# Patient Record
Sex: Female | Born: 1990 | Race: White | Hispanic: No | Marital: Married | Smoking: Former smoker
Health system: Southern US, Community
[De-identification: ages and names within clinical notes are randomized; demographics above are authoritative.]

## PROBLEM LIST (undated history)

## (undated) DIAGNOSIS — F429 Obsessive-compulsive disorder, unspecified: Secondary | ICD-10-CM

## (undated) DIAGNOSIS — O24111 Pre-existing diabetes mellitus, type 2, in pregnancy, first trimester: Secondary | ICD-10-CM

## (undated) DIAGNOSIS — N390 Urinary tract infection, site not specified: Secondary | ICD-10-CM

## (undated) DIAGNOSIS — O034 Incomplete spontaneous abortion without complication: Secondary | ICD-10-CM

## (undated) DIAGNOSIS — F419 Anxiety disorder, unspecified: Secondary | ICD-10-CM

## (undated) DIAGNOSIS — F329 Major depressive disorder, single episode, unspecified: Secondary | ICD-10-CM

## (undated) DIAGNOSIS — N719 Inflammatory disease of uterus, unspecified: Secondary | ICD-10-CM

## (undated) DIAGNOSIS — Z789 Other specified health status: Secondary | ICD-10-CM

## (undated) DIAGNOSIS — K219 Gastro-esophageal reflux disease without esophagitis: Secondary | ICD-10-CM

## (undated) DIAGNOSIS — F32A Depression, unspecified: Secondary | ICD-10-CM

## (undated) DIAGNOSIS — G43909 Migraine, unspecified, not intractable, without status migrainosus: Secondary | ICD-10-CM

## (undated) DIAGNOSIS — R519 Headache, unspecified: Secondary | ICD-10-CM

## (undated) DIAGNOSIS — R51 Headache: Secondary | ICD-10-CM

## (undated) DIAGNOSIS — B019 Varicella without complication: Secondary | ICD-10-CM

## (undated) HISTORY — DX: Headache, unspecified: R51.9

## (undated) HISTORY — DX: Pre-existing type 2 diabetes mellitus, in pregnancy, first trimester: O24.111

## (undated) HISTORY — PX: DILATION AND CURETTAGE OF UTERUS: SHX78

## (undated) HISTORY — DX: Migraine, unspecified, not intractable, without status migrainosus: G43.909

## (undated) HISTORY — PX: TONSILLECTOMY: SUR1361

## (undated) HISTORY — DX: Gastro-esophageal reflux disease without esophagitis: K21.9

## (undated) HISTORY — PX: HERNIA REPAIR: SHX51

## (undated) HISTORY — DX: Depression, unspecified: F32.A

## (undated) HISTORY — DX: Varicella without complication: B01.9

## (undated) HISTORY — DX: Headache: R51

## (undated) HISTORY — DX: Inflammatory disease of uterus, unspecified: N71.9

## (undated) HISTORY — DX: Urinary tract infection, site not specified: N39.0

## (undated) HISTORY — DX: Major depressive disorder, single episode, unspecified: F32.9

## (undated) HISTORY — PX: BLADDER SURGERY: SHX569

## (undated) HISTORY — DX: Obsessive-compulsive disorder, unspecified: F42.9

## (undated) HISTORY — DX: Incomplete spontaneous abortion without complication: O03.4

## (undated) HISTORY — DX: Anxiety disorder, unspecified: F41.9

---
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
## 2009-08-08 ENCOUNTER — Ambulatory Visit: Payer: Self-pay | Admitting: Family Medicine

## 2010-01-16 ENCOUNTER — Emergency Department: Payer: Self-pay | Admitting: Emergency Medicine

## 2010-03-07 ENCOUNTER — Emergency Department: Payer: Self-pay | Admitting: Emergency Medicine

## 2010-06-03 ENCOUNTER — Emergency Department: Payer: Self-pay | Admitting: Emergency Medicine

## 2010-07-23 ENCOUNTER — Emergency Department: Payer: Self-pay | Admitting: Emergency Medicine

## 2010-08-14 ENCOUNTER — Emergency Department: Payer: Self-pay | Admitting: Emergency Medicine

## 2010-09-22 NOTE — Op Note (Signed)
NAMECECILY, Sara Boyd          ACCOUNT NO.:  [id number]   MEDICAL RECORD NO.:  [id number]          PATIENT TYPE:  AMB   LOCATION:  [facility]                         FACILITY:  [facility]   PHYSICIAN:  Jamison Neighbor, M.D.  DATE OF BIRTH:  December 28, 1990   DATE OF PROCEDURE:  12/01/2007  DATE OF DISCHARGE:                               OPERATIVE REPORT   PREOPERATIVE DIAGNOSES:  1. Interstitial cystitis.  2. Urgency incontinence.   POSTOPERATIVE DIAGNOSES:  1. Interstitial cystitis.  2. Urgency incontinence.   PROCEDURE:  Cystoscopy, urethral calibration, hydrodistention of the  bladder, Marcaine and Pyridium installation, Marcaine and Kenalog  injection, bladder biopsy with cauterization.   SURGEON:  Jamison Neighbor, M.D.   ANESTHESIA:  General.   COMPLICATIONS:  None.   DRAINS:  None.   BRIEF HISTORY:  This 20 year old female presented to the office for  evaluation of chronic urinary tract symptoms including urgency,  frequency, pain and urge type incontinence.  The patient's examination  was felt to be potentially consistent with interstitial cystitis.  She  did undergo urodynamic testing which showed evidence of sensory urgency  with early sensation at just 44 mL and also evidence of true detrusor  instability with bladder pressures as high as 63 cm of water.  The  patient did not have much in the way of stress incontinence but  certainly is quite bothered by her incontinence as well as by her pelvic  pain.  The patient is now to undergo cystoscopy and hydrodistention to  evaluate her for interstitial cystitis.  She understands the risks and  benefits of the procedure and gave full informed consent.   PROCEDURE:  After successful induction of general anesthesia, the  patient was placed in the dorsal position, prepped with Betadine and  draped in the usual sterile fashion.  Careful bimanual examination  showed a normal urethra with no evidence of diverticulum.  There were no  signs of prolapse.  There was no cystocele, rectocele or enterocele.  The bladder was distended at a pressure of 100 cm of water for 5  minutes.  When the bladder was drained, granulations could be seen  throughout the bladder consistent with interstitial cystitis.  The  bladder capacity was just 500 mL which is markedly abnormal, comparing  quite unfavorably to a normal bladder capacity of between 1100 and 1200  mL actually less than the average IC capacity of 575.  A bladder biopsy  was performed and sent for mast cell analysis.  The bladder biopsy was  cauterized.  The bladder was drained.  A mixture of Marcaine and  Pyridium was left in the bladder.  A mixture of Marcaine and Kenalog was  injected periurethrally.  The patient tolerated the procedure well and  was taken to recovery in good condition.  She will be sent home with  Tylox, [place] and doxycycline and we also started her on anticholinergic  therapy.  She will return to see me in follow-up in 2  weeks at which point we will assess the results of the hydrodistention  and the anticholinergic therapy and decide whether the patient may  require  Botox, InterStim rather adjunctive treatments.  At that point we  will also initiate some IC directed therapy to try to improve her  bladder.      Jamison Neighbor, M.D.  Electronically Signed     RJE/MEDQ  D:  12/01/2007  T:  12/01/2007  Job:  [id number]

## 2010-10-22 ENCOUNTER — Emergency Department: Payer: Self-pay | Admitting: Unknown Physician Specialty

## 2010-12-21 ENCOUNTER — Emergency Department: Payer: Self-pay | Admitting: *Deleted

## 2011-02-05 LAB — POCT HEMOGLOBIN-HEMACUE
Hemoglobin: 13.5
Operator id: 268271

## 2011-03-21 ENCOUNTER — Emergency Department: Payer: Self-pay | Admitting: Emergency Medicine

## 2011-09-26 ENCOUNTER — Emergency Department: Payer: Self-pay | Admitting: *Deleted

## 2011-09-27 LAB — URINALYSIS, COMPLETE
Bacteria: NONE SEEN
Bilirubin,UR: NEGATIVE
Blood: NEGATIVE
Glucose,UR: NEGATIVE mg/dL (ref 0–75)
Ketone: NEGATIVE
Nitrite: NEGATIVE
Ph: 6 (ref 4.5–8.0)
Protein: NEGATIVE
Squamous Epithelial: 2

## 2011-09-27 LAB — CBC
HCT: 43.5 % (ref 35.0–47.0)
HGB: 14.1 g/dL (ref 12.0–16.0)
MCH: 28.5 pg (ref 26.0–34.0)
RBC: 4.96 10*6/uL (ref 3.80–5.20)

## 2011-09-27 LAB — TSH: Thyroid Stimulating Horm: 3.98 u[IU]/mL

## 2011-09-27 LAB — COMPREHENSIVE METABOLIC PANEL
Albumin: 3.8 g/dL (ref 3.4–5.0)
Alkaline Phosphatase: 64 U/L (ref 50–136)
BUN: 14 mg/dL (ref 7–18)
Bilirubin,Total: 0.4 mg/dL (ref 0.2–1.0)
Calcium, Total: 8.9 mg/dL (ref 8.5–10.1)
Chloride: 108 mmol/L — ABNORMAL HIGH (ref 98–107)
Creatinine: 0.95 mg/dL (ref 0.60–1.30)
EGFR (African American): >60
EGFR (Non-African Amer.): >60
Osmolality: 285 (ref 275–301)
SGOT(AST): 17 U/L (ref 15–37)
SGPT (ALT): 23 U/L
Total Protein: 7.7 g/dL (ref 6.4–8.2)

## 2011-09-27 LAB — T4, FREE: Free Thyroxine: 1.05 ng/dL (ref 0.76–1.46)

## 2011-09-27 LAB — PREGNANCY, URINE: Pregnancy Test, Urine: NEGATIVE m[IU]/mL

## 2011-09-29 ENCOUNTER — Emergency Department: Payer: Self-pay | Admitting: Emergency Medicine

## 2011-09-29 LAB — CBC WITH DIFFERENTIAL/PLATELET
Basophil #: 0.1 10*3/uL (ref 0.0–0.1)
Eosinophil #: 0 10*3/uL (ref 0.0–0.7)
Eosinophil %: 0.5 %
HCT: 42.1 % (ref 35.0–47.0)
HGB: 14.2 g/dL (ref 12.0–16.0)
MCV: 87 fL (ref 80–100)
Monocyte %: 4.1 %
Neutrophil %: 84.1 %
Platelet: 246 10*3/uL (ref 150–440)

## 2011-09-29 LAB — COMPREHENSIVE METABOLIC PANEL
Albumin: 3.8 g/dL (ref 3.4–5.0)
Alkaline Phosphatase: 51 U/L (ref 50–136)
Anion Gap: 7 (ref 7–16)
BUN: 14 mg/dL (ref 7–18)
Calcium, Total: 9.3 mg/dL (ref 8.5–10.1)
Co2: 25 mmol/L (ref 21–32)
Creatinine: 1.03 mg/dL (ref 0.60–1.30)
EGFR (African American): >60
Osmolality: 283 (ref 275–301)
Potassium: 3.9 mmol/L (ref 3.5–5.1)
Sodium: 142 mmol/L (ref 136–145)
Total Protein: 7.6 g/dL (ref 6.4–8.2)

## 2011-09-29 LAB — TSH: Thyroid Stimulating Horm: 0.898 u[IU]/mL

## 2011-10-11 ENCOUNTER — Emergency Department: Payer: Self-pay | Admitting: Emergency Medicine

## 2011-10-11 LAB — URINALYSIS, COMPLETE
Bilirubin,UR: NEGATIVE
Ketone: NEGATIVE
Nitrite: POSITIVE
Protein: 100
RBC,UR: 66 /HPF (ref 0–5)
Specific Gravity: 1.024 (ref 1.003–1.030)
Squamous Epithelial: 23

## 2011-10-11 LAB — CBC
HCT: 41.4 % (ref 35.0–47.0)
HGB: 13.5 g/dL (ref 12.0–16.0)
MCH: 28.8 pg (ref 26.0–34.0)
MCHC: 32.7 g/dL (ref 32.0–36.0)
MCV: 88 fL (ref 80–100)
Platelet: 207 10*3/uL (ref 150–440)
RBC: 4.71 10*6/uL (ref 3.80–5.20)
WBC: 8.6 10*3/uL (ref 3.6–11.0)

## 2011-10-11 LAB — COMPREHENSIVE METABOLIC PANEL
Albumin: 3.8 g/dL (ref 3.4–5.0)
Anion Gap: 11 (ref 7–16)
Bilirubin,Total: 0.5 mg/dL (ref 0.2–1.0)
Calcium, Total: 9 mg/dL (ref 8.5–10.1)
Co2: 23 mmol/L (ref 21–32)
Creatinine: 0.79 mg/dL (ref 0.60–1.30)
EGFR (African American): >60
Glucose: 108 mg/dL — ABNORMAL HIGH (ref 65–99)
Osmolality: 282 (ref 275–301)
Potassium: 3.7 mmol/L (ref 3.5–5.1)
Sodium: 142 mmol/L (ref 136–145)
Total Protein: 7 g/dL (ref 6.4–8.2)

## 2011-10-11 LAB — HCG, QUANTITATIVE, PREGNANCY: Beta Hcg, Quant.: 7650 m[IU]/mL — ABNORMAL HIGH

## 2011-10-11 LAB — WET PREP, GENITAL

## 2011-10-16 ENCOUNTER — Emergency Department: Payer: Self-pay | Admitting: Emergency Medicine

## 2011-10-16 LAB — CBC
HCT: 41.9 % (ref 35.0–47.0)
HGB: 14.1 g/dL (ref 12.0–16.0)
MCH: 29.7 pg (ref 26.0–34.0)
Platelet: 199 10*3/uL (ref 150–440)

## 2011-10-16 LAB — COMPREHENSIVE METABOLIC PANEL
Albumin: 3.8 g/dL (ref 3.4–5.0)
Anion Gap: 8 (ref 7–16)
BUN: 9 mg/dL (ref 7–18)
Bilirubin,Total: 0.6 mg/dL (ref 0.2–1.0)
Chloride: 107 mmol/L (ref 98–107)
Co2: 24 mmol/L (ref 21–32)
EGFR (Non-African Amer.): >60
Osmolality: 276 (ref 275–301)
Potassium: 4.2 mmol/L (ref 3.5–5.1)
SGOT(AST): 17 U/L (ref 15–37)
SGPT (ALT): 24 U/L
Sodium: 139 mmol/L (ref 136–145)

## 2011-10-16 LAB — URINALYSIS, COMPLETE
Bilirubin,UR: NEGATIVE
Blood: NEGATIVE
Glucose,UR: NEGATIVE mg/dL (ref 0–75)
RBC,UR: 1 /HPF (ref 0–5)
Squamous Epithelial: 2
WBC UR: 20 /HPF (ref 0–5)

## 2011-10-16 LAB — LIPASE, BLOOD: Lipase: 99 U/L (ref 73–393)

## 2011-10-16 LAB — WET PREP, GENITAL

## 2011-10-17 LAB — URINE CULTURE

## 2011-10-20 LAB — URINE CULTURE

## 2011-10-23 ENCOUNTER — Emergency Department: Payer: Self-pay | Admitting: Emergency Medicine

## 2011-10-23 LAB — CBC
HCT: 41.6 % (ref 35.0–47.0)
MCH: 29.9 pg (ref 26.0–34.0)
MCHC: 34 g/dL (ref 32.0–36.0)
MCV: 88 fL (ref 80–100)
Platelet: 158 10*3/uL (ref 150–440)
RDW: 14.1 % (ref 11.5–14.5)
WBC: 9.5 10*3/uL (ref 3.6–11.0)

## 2011-10-23 LAB — URINALYSIS, COMPLETE
Bacteria: NONE SEEN
Bilirubin,UR: NEGATIVE
Blood: NEGATIVE
Glucose,UR: NEGATIVE mg/dL (ref 0–75)
Ketone: NEGATIVE
Leukocyte Esterase: NEGATIVE
Nitrite: NEGATIVE
Ph: 6 (ref 4.5–8.0)
Protein: NEGATIVE
RBC,UR: NONE SEEN /HPF (ref 0–5)
Specific Gravity: 1.02 (ref 1.003–1.030)
Squamous Epithelial: 2
WBC UR: NONE SEEN /HPF (ref 0–5)

## 2011-10-23 LAB — WET PREP, GENITAL

## 2011-10-23 LAB — HCG, QUANTITATIVE, PREGNANCY: Beta Hcg, Quant.: 47127 m[IU]/mL — ABNORMAL HIGH

## 2011-11-02 ENCOUNTER — Emergency Department: Payer: Self-pay | Admitting: Emergency Medicine

## 2011-11-02 LAB — URINALYSIS, COMPLETE
Bacteria: NONE SEEN
Bilirubin,UR: NEGATIVE
Ph: 5 (ref 4.5–8.0)
Protein: 30
RBC,UR: 2 /HPF (ref 0–5)
Specific Gravity: 1.023 (ref 1.003–1.030)
Squamous Epithelial: 16
WBC UR: 63 /HPF (ref 0–5)

## 2011-11-02 LAB — HCG, QUANTITATIVE, PREGNANCY: Beta Hcg, Quant.: 74824 m[IU]/mL — ABNORMAL HIGH

## 2011-11-02 LAB — COMPREHENSIVE METABOLIC PANEL
Albumin: 3.7 g/dL (ref 3.4–5.0)
Alkaline Phosphatase: 63 U/L (ref 50–136)
Calcium, Total: 9.3 mg/dL (ref 8.5–10.1)
EGFR (African American): >60
EGFR (Non-African Amer.): >60
Potassium: 3.5 mmol/L (ref 3.5–5.1)
SGPT (ALT): 22 U/L
Sodium: 137 mmol/L (ref 136–145)

## 2011-11-02 LAB — CBC
HCT: 41.6 % (ref 35.0–47.0)
HGB: 14 g/dL (ref 12.0–16.0)
MCV: 88 fL (ref 80–100)
Platelet: 253 10*3/uL (ref 150–440)
RDW: 13.9 % (ref 11.5–14.5)
WBC: 9.8 10*3/uL (ref 3.6–11.0)

## 2011-11-06 ENCOUNTER — Inpatient Hospital Stay: Payer: Self-pay

## 2011-11-06 LAB — URINALYSIS, COMPLETE
Bilirubin,UR: NEGATIVE
Blood: NEGATIVE
Glucose,UR: NEGATIVE mg/dL (ref 0–75)
Nitrite: NEGATIVE
Ph: 5 (ref 4.5–8.0)
Protein: 30
Specific Gravity: 1.026 (ref 1.003–1.030)

## 2011-11-06 LAB — BASIC METABOLIC PANEL
Anion Gap: 6 — ABNORMAL LOW (ref 7–16)
BUN: 6 mg/dL — ABNORMAL LOW (ref 7–18)
Calcium, Total: 9 mg/dL (ref 8.5–10.1)
Chloride: 110 mmol/L — ABNORMAL HIGH (ref 98–107)
Creatinine: 0.81 mg/dL (ref 0.60–1.30)
EGFR (Non-African Amer.): >60
Glucose: 106 mg/dL — ABNORMAL HIGH (ref 65–99)
Osmolality: 279 (ref 275–301)
Potassium: 3.4 mmol/L — ABNORMAL LOW (ref 3.5–5.1)
Sodium: 141 mmol/L (ref 136–145)

## 2011-11-25 ENCOUNTER — Emergency Department: Payer: Self-pay | Admitting: Emergency Medicine

## 2011-11-25 LAB — URINALYSIS, COMPLETE
Bilirubin,UR: NEGATIVE
Glucose,UR: NEGATIVE mg/dL (ref 0–75)
Ketone: NEGATIVE
Ph: 5 (ref 4.5–8.0)
Protein: NEGATIVE
RBC,UR: 1 /HPF (ref 0–5)

## 2011-11-25 LAB — HCG, QUANTITATIVE, PREGNANCY: Beta Hcg, Quant.: <1 m[IU]/mL — ABNORMAL LOW

## 2011-11-25 LAB — CBC
HGB: 13.9 g/dL (ref 12.0–16.0)
MCH: 30 pg (ref 26.0–34.0)
MCV: 88 fL (ref 80–100)
RBC: 4.62 10*6/uL (ref 3.80–5.20)
WBC: 6.9 10*3/uL (ref 3.6–11.0)

## 2011-12-05 ENCOUNTER — Emergency Department: Payer: Self-pay | Admitting: Unknown Physician Specialty

## 2011-12-05 LAB — CBC
HCT: 40.4 % (ref 35.0–47.0)
HGB: 14.2 g/dL (ref 12.0–16.0)
MCH: 30.5 pg (ref 26.0–34.0)
Platelet: 236 10*3/uL (ref 150–440)
RBC: 4.65 10*6/uL (ref 3.80–5.20)
RDW: 13.8 % (ref 11.5–14.5)

## 2011-12-05 LAB — COMPREHENSIVE METABOLIC PANEL
Albumin: 3.2 g/dL — ABNORMAL LOW (ref 3.4–5.0)
Bilirubin,Total: 0.5 mg/dL (ref 0.2–1.0)
Chloride: 110 mmol/L — ABNORMAL HIGH (ref 98–107)
Creatinine: 0.76 mg/dL (ref 0.60–1.30)
EGFR (African American): >60
Potassium: 3.8 mmol/L (ref 3.5–5.1)
SGOT(AST): 17 U/L (ref 15–37)
SGPT (ALT): 19 U/L
Total Protein: 7 g/dL (ref 6.4–8.2)

## 2011-12-05 LAB — URINALYSIS, COMPLETE
Bilirubin,UR: NEGATIVE
Blood: NEGATIVE
Nitrite: NEGATIVE
Ph: 5 (ref 4.5–8.0)
RBC,UR: <1 /HPF (ref 0–5)
Specific Gravity: 1.023 (ref 1.003–1.030)
Squamous Epithelial: 3

## 2011-12-05 LAB — PREGNANCY, URINE: Pregnancy Test, Urine: POSITIVE m[IU]/mL

## 2011-12-05 LAB — HCG, QUANTITATIVE, PREGNANCY: Beta Hcg, Quant.: 30527 m[IU]/mL — ABNORMAL HIGH

## 2011-12-10 ENCOUNTER — Emergency Department: Payer: Self-pay | Admitting: *Deleted

## 2011-12-10 LAB — BASIC METABOLIC PANEL
Calcium, Total: 8.7 mg/dL (ref 8.5–10.1)
Chloride: 110 mmol/L — ABNORMAL HIGH (ref 98–107)
Co2: 22 mmol/L (ref 21–32)
Creatinine: 0.69 mg/dL (ref 0.60–1.30)
EGFR (African American): >60
Glucose: 103 mg/dL — ABNORMAL HIGH (ref 65–99)
Osmolality: 280 (ref 275–301)
Potassium: 3.7 mmol/L (ref 3.5–5.1)
Sodium: 141 mmol/L (ref 136–145)

## 2011-12-10 LAB — URINALYSIS, COMPLETE
Bacteria: NONE SEEN
Bilirubin,UR: NEGATIVE
Glucose,UR: NEGATIVE mg/dL (ref 0–75)
Ketone: NEGATIVE
Leukocyte Esterase: NEGATIVE
Protein: NEGATIVE
RBC,UR: NONE SEEN /HPF (ref 0–5)
Specific Gravity: 1.024 (ref 1.003–1.030)
WBC UR: 1 /HPF (ref 0–5)

## 2011-12-10 LAB — CBC
MCH: 30.7 pg (ref 26.0–34.0)
MCHC: 35.6 g/dL (ref 32.0–36.0)
MCV: 86 fL (ref 80–100)
Platelet: 203 10*3/uL (ref 150–440)
RDW: 13.5 % (ref 11.5–14.5)
WBC: 8.3 10*3/uL (ref 3.6–11.0)

## 2011-12-23 ENCOUNTER — Observation Stay: Payer: Self-pay | Admitting: Obstetrics & Gynecology

## 2011-12-23 LAB — URINALYSIS, COMPLETE
Bacteria: NONE SEEN
Bilirubin,UR: NEGATIVE
Glucose,UR: >=500 mg/dL (ref 0–75)
Leukocyte Esterase: NEGATIVE
Nitrite: NEGATIVE
Protein: NEGATIVE
RBC,UR: NONE SEEN /HPF (ref 0–5)
Specific Gravity: 1.002 (ref 1.003–1.030)
Squamous Epithelial: 1

## 2011-12-23 LAB — BASIC METABOLIC PANEL
Anion Gap: 7 (ref 7–16)
BUN: 5 mg/dL — ABNORMAL LOW (ref 7–18)
Chloride: 108 mmol/L — ABNORMAL HIGH (ref 98–107)
Co2: 24 mmol/L (ref 21–32)
EGFR (African American): >60
Osmolality: 272 (ref 275–301)

## 2012-01-02 ENCOUNTER — Inpatient Hospital Stay: Payer: Self-pay | Admitting: Obstetrics & Gynecology

## 2012-01-02 LAB — URINALYSIS, COMPLETE
Bacteria: NONE SEEN
Bilirubin,UR: NEGATIVE
Blood: NEGATIVE
Ketone: NEGATIVE
Ph: 5 (ref 4.5–8.0)
Protein: NEGATIVE
RBC,UR: 8 /HPF (ref 0–5)
Specific Gravity: 1.028 (ref 1.003–1.030)
Squamous Epithelial: 2

## 2012-01-02 LAB — COMPREHENSIVE METABOLIC PANEL
Albumin: 3.1 g/dL — ABNORMAL LOW (ref 3.4–5.0)
Alkaline Phosphatase: 63 U/L (ref 50–136)
Anion Gap: 11 (ref 7–16)
BUN: 11 mg/dL (ref 7–18)
EGFR (African American): >60
Glucose: 121 mg/dL — ABNORMAL HIGH (ref 65–99)
Potassium: 3.8 mmol/L (ref 3.5–5.1)
SGOT(AST): 23 U/L (ref 15–37)
SGPT (ALT): 28 U/L (ref 12–78)
Sodium: 139 mmol/L (ref 136–145)
Total Protein: 7.2 g/dL (ref 6.4–8.2)

## 2012-01-02 LAB — CBC
HCT: 37.7 % (ref 35.0–47.0)
HGB: 13.3 g/dL (ref 12.0–16.0)
MCH: 30.4 pg (ref 26.0–34.0)
MCV: 86 fL (ref 80–100)
Platelet: 261 10*3/uL (ref 150–440)
RDW: 13.5 % (ref 11.5–14.5)
WBC: 8.3 10*3/uL (ref 3.6–11.0)

## 2012-01-02 LAB — WET PREP, GENITAL

## 2012-01-03 LAB — CBC WITH DIFFERENTIAL/PLATELET
Basophil #: 0 10*3/uL (ref 0.0–0.1)
Basophil %: 0.4 %
Eosinophil #: 0.1 10*3/uL (ref 0.0–0.7)
HCT: 34.9 % — ABNORMAL LOW (ref 35.0–47.0)
HGB: 12.1 g/dL (ref 12.0–16.0)
Lymphocyte %: 26.1 %
MCHC: 34.7 g/dL (ref 32.0–36.0)
MCV: 87 fL (ref 80–100)
Monocyte %: 5.6 %
Neutrophil #: 3.9 10*3/uL (ref 1.4–6.5)
Neutrophil %: 66.5 %
Platelet: 193 10*3/uL (ref 150–440)
WBC: 5.9 10*3/uL (ref 3.6–11.0)

## 2012-01-04 LAB — FETAL FIBRONECTIN

## 2012-01-06 ENCOUNTER — Encounter: Payer: Self-pay | Admitting: Obstetrics & Gynecology

## 2012-01-06 LAB — URINALYSIS, COMPLETE
Bilirubin,UR: NEGATIVE
Blood: NEGATIVE
Ketone: NEGATIVE
Nitrite: NEGATIVE
Ph: 6 (ref 4.5–8.0)
Protein: NEGATIVE
Specific Gravity: 1.024 (ref 1.003–1.030)
Squamous Epithelial: 15
WBC UR: 3 /HPF (ref 0–5)

## 2012-01-11 ENCOUNTER — Emergency Department: Payer: Self-pay | Admitting: Emergency Medicine

## 2012-01-11 LAB — COMPREHENSIVE METABOLIC PANEL
Albumin: 3.1 g/dL — ABNORMAL LOW (ref 3.4–5.0)
Alkaline Phosphatase: 58 U/L (ref 50–136)
Bilirubin,Total: 0.5 mg/dL (ref 0.2–1.0)
Calcium, Total: 9.1 mg/dL (ref 8.5–10.1)
EGFR (African American): >60
Glucose: 108 mg/dL — ABNORMAL HIGH (ref 65–99)
SGOT(AST): 14 U/L — ABNORMAL LOW (ref 15–37)

## 2012-01-11 LAB — CBC
HCT: 36.9 % (ref 35.0–47.0)
MCHC: 34.7 g/dL (ref 32.0–36.0)
Platelet: 249 10*3/uL (ref 150–440)
RDW: 13.9 % (ref 11.5–14.5)
WBC: 9.8 10*3/uL (ref 3.6–11.0)

## 2012-01-19 ENCOUNTER — Emergency Department: Payer: Self-pay | Admitting: Unknown Physician Specialty

## 2012-01-19 LAB — URINALYSIS, COMPLETE
Bilirubin,UR: NEGATIVE
Blood: NEGATIVE
Glucose,UR: NEGATIVE mg/dL (ref 0–75)
Ph: 5 (ref 4.5–8.0)
Protein: NEGATIVE
RBC,UR: 1 /HPF (ref 0–5)
Specific Gravity: 1.028 (ref 1.003–1.030)

## 2012-01-19 LAB — COMPREHENSIVE METABOLIC PANEL
Albumin: 2.9 g/dL — ABNORMAL LOW (ref 3.4–5.0)
Anion Gap: 9 (ref 7–16)
BUN: 10 mg/dL (ref 7–18)
Calcium, Total: 9.1 mg/dL (ref 8.5–10.1)
Chloride: 109 mmol/L — ABNORMAL HIGH (ref 98–107)
Creatinine: 0.6 mg/dL (ref 0.60–1.30)
EGFR (African American): >60
EGFR (Non-African Amer.): >60
Osmolality: 275 (ref 275–301)
Potassium: 3.9 mmol/L (ref 3.5–5.1)
SGPT (ALT): 18 U/L (ref 12–78)
Sodium: 139 mmol/L (ref 136–145)
Total Protein: 6.8 g/dL (ref 6.4–8.2)

## 2012-01-19 LAB — CBC
HCT: 36.2 % (ref 35.0–47.0)
HGB: 12.8 g/dL (ref 12.0–16.0)
MCV: 87 fL (ref 80–100)
RBC: 4.16 10*6/uL (ref 3.80–5.20)

## 2012-01-19 LAB — LIPASE, BLOOD: Lipase: 79 U/L (ref 73–393)

## 2012-02-05 ENCOUNTER — Observation Stay: Payer: Self-pay

## 2012-02-05 LAB — URINALYSIS, COMPLETE
Blood: NEGATIVE
Glucose,UR: NEGATIVE mg/dL (ref 0–75)
Hyaline Cast: 3
Nitrite: NEGATIVE
Protein: 30
Specific Gravity: 1.027 (ref 1.003–1.030)
WBC UR: 13 /HPF (ref 0–5)

## 2012-02-13 ENCOUNTER — Observation Stay: Payer: Self-pay | Admitting: Obstetrics and Gynecology

## 2012-02-13 LAB — URINALYSIS, COMPLETE
Blood: NEGATIVE
Glucose,UR: 100 mg/dL (ref 0–75)
Nitrite: NEGATIVE
Ph: 6 (ref 4.5–8.0)
Specific Gravity: 1.02 (ref 1.003–1.030)
Squamous Epithelial: 4

## 2012-02-13 LAB — FETAL FIBRONECTIN
Appearance: NORMAL
Fetal Fibronectin: POSITIVE

## 2012-02-15 ENCOUNTER — Observation Stay: Payer: Self-pay

## 2012-02-15 LAB — CBC WITH DIFFERENTIAL/PLATELET
Basophil #: 0 10*3/uL (ref 0.0–0.1)
Eosinophil #: 0 10*3/uL (ref 0.0–0.7)
HCT: 33.9 % — ABNORMAL LOW (ref 35.0–47.0)
MCV: 88 fL (ref 80–100)
Monocyte #: 0.5 x10 3/mm (ref 0.2–0.9)
Neutrophil %: 90.4 %
Platelet: 215 10*3/uL (ref 150–440)
RBC: 3.88 10*6/uL (ref 3.80–5.20)
RDW: 13.5 % (ref 11.5–14.5)
WBC: 13.4 10*3/uL — ABNORMAL HIGH (ref 3.6–11.0)

## 2012-02-15 LAB — BASIC METABOLIC PANEL
Anion Gap: 11 (ref 7–16)
BUN: 6 mg/dL — ABNORMAL LOW (ref 7–18)
Calcium, Total: 8.5 mg/dL (ref 8.5–10.1)
Chloride: 110 mmol/L — ABNORMAL HIGH (ref 98–107)
Co2: 21 mmol/L (ref 21–32)
Creatinine: 0.73 mg/dL (ref 0.60–1.30)
EGFR (African American): >60
Osmolality: 282 (ref 275–301)

## 2012-02-16 LAB — URINE CULTURE

## 2012-03-03 ENCOUNTER — Observation Stay: Payer: Self-pay | Admitting: Obstetrics & Gynecology

## 2012-04-01 ENCOUNTER — Observation Stay: Payer: Self-pay | Admitting: Internal Medicine

## 2012-04-09 ENCOUNTER — Observation Stay: Payer: Self-pay

## 2012-04-09 LAB — URINALYSIS, COMPLETE
Bilirubin,UR: NEGATIVE
Glucose,UR: NEGATIVE mg/dL (ref 0–75)
Ketone: NEGATIVE
Protein: 100
Specific Gravity: 1.018 (ref 1.003–1.030)
Squamous Epithelial: 2

## 2012-04-13 ENCOUNTER — Observation Stay: Payer: Self-pay | Admitting: Obstetrics and Gynecology

## 2012-04-13 LAB — FETAL FIBRONECTIN

## 2012-04-13 LAB — URINALYSIS, COMPLETE
Bilirubin,UR: NEGATIVE
Glucose,UR: 50 mg/dL (ref 0–75)
Ketone: NEGATIVE
Nitrite: NEGATIVE
Protein: NEGATIVE
RBC,UR: 2 /HPF (ref 0–5)
Specific Gravity: 1.023 (ref 1.003–1.030)
Squamous Epithelial: 4
WBC UR: 49 /HPF (ref 0–5)

## 2012-05-06 ENCOUNTER — Observation Stay: Payer: Self-pay

## 2012-05-17 ENCOUNTER — Observation Stay: Payer: Self-pay

## 2012-05-28 ENCOUNTER — Observation Stay: Payer: Self-pay

## 2012-05-31 ENCOUNTER — Inpatient Hospital Stay: Payer: Self-pay | Admitting: Obstetrics & Gynecology

## 2012-05-31 LAB — CBC WITH DIFFERENTIAL/PLATELET
Basophil %: 0.5 %
Eosinophil %: 0.5 %
HCT: 36.6 % (ref 35.0–47.0)
HGB: 12.3 g/dL (ref 12.0–16.0)
Lymphocyte #: 1.4 10*3/uL (ref 1.0–3.6)
Lymphocyte %: 13.6 %
MCH: 28.2 pg (ref 26.0–34.0)
MCV: 84 fL (ref 80–100)
Neutrophil #: 8.2 10*3/uL — ABNORMAL HIGH (ref 1.4–6.5)
Neutrophil %: 80 %
Platelet: 224 10*3/uL (ref 150–440)
RBC: 4.38 10*6/uL (ref 3.80–5.20)
RDW: 14.3 % (ref 11.5–14.5)

## 2012-06-02 LAB — HEMATOCRIT: HCT: 31.7 % — ABNORMAL LOW (ref 35.0–47.0)

## 2012-06-08 ENCOUNTER — Emergency Department: Payer: Self-pay | Admitting: Emergency Medicine

## 2012-06-08 LAB — COMPREHENSIVE METABOLIC PANEL
Albumin: 3.3 g/dL — ABNORMAL LOW (ref 3.4–5.0)
Anion Gap: 9 (ref 7–16)
Bilirubin,Total: 0.6 mg/dL (ref 0.2–1.0)
Chloride: 108 mmol/L — ABNORMAL HIGH (ref 98–107)
Co2: 23 mmol/L (ref 21–32)
Creatinine: 1.05 mg/dL (ref 0.60–1.30)
EGFR (African American): >60
SGOT(AST): 19 U/L (ref 15–37)
Sodium: 140 mmol/L (ref 136–145)
Total Protein: 7.8 g/dL (ref 6.4–8.2)

## 2012-06-08 LAB — URINALYSIS, COMPLETE
Bilirubin,UR: NEGATIVE
Nitrite: NEGATIVE
Specific Gravity: 1.019 (ref 1.003–1.030)
Squamous Epithelial: 4
WBC UR: 273 /HPF (ref 0–5)

## 2012-06-08 LAB — CBC
MCH: 28.5 pg (ref 26.0–34.0)
MCHC: 33.9 g/dL (ref 32.0–36.0)
MCV: 84 fL (ref 80–100)
Platelet: 318 10*3/uL (ref 150–440)
RBC: 5.02 10*6/uL (ref 3.80–5.20)
RDW: 14.5 % (ref 11.5–14.5)
WBC: 10.5 10*3/uL (ref 3.6–11.0)

## 2012-06-08 LAB — LIPASE, BLOOD: Lipase: 109 U/L (ref 73–393)

## 2012-06-08 LAB — HCG, QUANTITATIVE, PREGNANCY: Beta Hcg, Quant.: 11 m[IU]/mL — ABNORMAL HIGH

## 2013-12-08 ENCOUNTER — Emergency Department: Payer: Self-pay | Admitting: Emergency Medicine

## 2013-12-12 ENCOUNTER — Emergency Department: Payer: Self-pay | Admitting: Emergency Medicine

## 2013-12-12 LAB — CARBAMAZEPINE LEVEL, TOTAL: Carbamazepine: 1 ug/mL — ABNORMAL LOW (ref 4.0–12.0)

## 2013-12-14 ENCOUNTER — Emergency Department: Payer: Self-pay | Admitting: Emergency Medicine

## 2013-12-14 LAB — COMPREHENSIVE METABOLIC PANEL
ALK PHOS: 64 U/L
ANION GAP: 11 (ref 7–16)
AST: 20 U/L (ref 15–37)
Albumin: 4 g/dL (ref 3.4–5.0)
BILIRUBIN TOTAL: 0.5 mg/dL (ref 0.2–1.0)
BUN: 11 mg/dL (ref 7–18)
CALCIUM: 9 mg/dL (ref 8.5–10.1)
CHLORIDE: 108 mmol/L — AB (ref 98–107)
Co2: 23 mmol/L (ref 21–32)
Creatinine: 1.01 mg/dL (ref 0.60–1.30)
EGFR (Non-African Amer.): >60
GLUCOSE: 100 mg/dL — AB (ref 65–99)
OSMOLALITY: 283 (ref 275–301)
Potassium: 3.7 mmol/L (ref 3.5–5.1)
SGPT (ALT): 24 U/L
SODIUM: 142 mmol/L (ref 136–145)
TOTAL PROTEIN: 7.8 g/dL (ref 6.4–8.2)

## 2013-12-14 LAB — CBC WITH DIFFERENTIAL/PLATELET
BASOS ABS: 0 10*3/uL (ref 0.0–0.1)
Basophil %: 0.4 %
Eosinophil #: 0 10*3/uL (ref 0.0–0.7)
Eosinophil %: 0.3 %
HCT: 42.6 % (ref 35.0–47.0)
HGB: 14.4 g/dL (ref 12.0–16.0)
Lymphocyte #: 1.2 10*3/uL (ref 1.0–3.6)
Lymphocyte %: 13.9 %
MCH: 29.1 pg (ref 26.0–34.0)
MCHC: 33.7 g/dL (ref 32.0–36.0)
MCV: 86 fL (ref 80–100)
MONOS PCT: 4.8 %
Monocyte #: 0.4 x10 3/mm (ref 0.2–0.9)
NEUTROS ABS: 7.1 10*3/uL — AB (ref 1.4–6.5)
Neutrophil %: 80.6 %
PLATELETS: 242 10*3/uL (ref 150–440)
RBC: 4.93 10*6/uL (ref 3.80–5.20)
RDW: 13.8 % (ref 11.5–14.5)
WBC: 8.8 10*3/uL (ref 3.6–11.0)

## 2013-12-14 LAB — CK TOTAL AND CKMB (NOT AT ARMC)
CK, Total: 85 U/L
CK-MB: 0.8 ng/mL (ref 0.5–3.6)

## 2013-12-14 LAB — URINALYSIS, COMPLETE
BILIRUBIN, UR: NEGATIVE
Blood: NEGATIVE
Glucose,UR: NEGATIVE mg/dL (ref 0–75)
Nitrite: NEGATIVE
PH: 5 (ref 4.5–8.0)
Protein: NEGATIVE
RBC,UR: 2 /HPF (ref 0–5)
Specific Gravity: 1.02 (ref 1.003–1.030)
Squamous Epithelial: 3

## 2013-12-14 LAB — TROPONIN I: Troponin-I: <0.02 ng/mL

## 2013-12-24 ENCOUNTER — Emergency Department: Payer: Self-pay | Admitting: Emergency Medicine

## 2013-12-25 ENCOUNTER — Emergency Department: Payer: Self-pay | Admitting: Emergency Medicine

## 2013-12-27 ENCOUNTER — Emergency Department: Payer: Self-pay | Admitting: Emergency Medicine

## 2013-12-27 LAB — CK: CK, Total: 59 U/L

## 2013-12-31 ENCOUNTER — Telehealth: Payer: Self-pay | Admitting: Internal Medicine

## 2013-12-31 NOTE — Telephone Encounter (Signed)
OK to schedule pt in next open slot. We can look at schedule today if necessary.

## 2013-12-31 NOTE — Telephone Encounter (Signed)
Ok to schedule patient

## 2013-12-31 NOTE — Telephone Encounter (Signed)
Pt's mother called in and stated she is a patient here and said Dr.Walker would accept her as a new pt and stated she needs to be seen asap so she can be referred to a heart doctor. Was told not anything until October but would like to be seen a lot sooner. Please advise.

## 2013-12-31 NOTE — Telephone Encounter (Signed)
I blocked a 30 min slot 01/11/14 at 3:00, please call and schedule then if possible

## 2014-01-06 ENCOUNTER — Emergency Department: Payer: Self-pay | Admitting: Emergency Medicine

## 2014-01-06 LAB — URINALYSIS, COMPLETE
Bilirubin,UR: NEGATIVE
Blood: NEGATIVE
GLUCOSE, UR: NEGATIVE mg/dL (ref 0–75)
KETONE: NEGATIVE
Nitrite: POSITIVE
Ph: 5 (ref 4.5–8.0)
Protein: 25
Specific Gravity: 1.025 (ref 1.003–1.030)
Squamous Epithelial: 20
WBC UR: 21 /HPF (ref 0–5)

## 2014-01-06 LAB — COMPREHENSIVE METABOLIC PANEL
Albumin: 4.3 g/dL (ref 3.4–5.0)
Alkaline Phosphatase: 66 U/L
Anion Gap: 12 (ref 7–16)
BUN: 9 mg/dL (ref 7–18)
Bilirubin,Total: 0.8 mg/dL (ref 0.2–1.0)
Calcium, Total: 9.5 mg/dL (ref 8.5–10.1)
Chloride: 110 mmol/L — ABNORMAL HIGH (ref 98–107)
Co2: 19 mmol/L — ABNORMAL LOW (ref 21–32)
Creatinine: 0.96 mg/dL (ref 0.60–1.30)
EGFR (Non-African Amer.): >60
Glucose: 93 mg/dL (ref 65–99)
OSMOLALITY: 280 (ref 275–301)
Potassium: 3.8 mmol/L (ref 3.5–5.1)
SGOT(AST): 12 U/L — ABNORMAL LOW (ref 15–37)
SGPT (ALT): 26 U/L
Sodium: 141 mmol/L (ref 136–145)
Total Protein: 7.6 g/dL (ref 6.4–8.2)

## 2014-01-06 LAB — CBC WITH DIFFERENTIAL/PLATELET
Basophil #: 0 10*3/uL (ref 0.0–0.1)
Basophil %: 0.6 %
Eosinophil #: 0 10*3/uL (ref 0.0–0.7)
Eosinophil %: 0.5 %
HCT: 44.5 % (ref 35.0–47.0)
HGB: 14.6 g/dL (ref 12.0–16.0)
LYMPHS PCT: 23.2 %
Lymphocyte #: 1.6 10*3/uL (ref 1.0–3.6)
MCH: 28.9 pg (ref 26.0–34.0)
MCHC: 32.9 g/dL (ref 32.0–36.0)
MCV: 88 fL (ref 80–100)
MONO ABS: 0.4 x10 3/mm (ref 0.2–0.9)
MONOS PCT: 6 %
Neutrophil #: 4.9 10*3/uL (ref 1.4–6.5)
Neutrophil %: 69.7 %
PLATELETS: 227 10*3/uL (ref 150–440)
RBC: 5.07 10*6/uL (ref 3.80–5.20)
RDW: 13.6 % (ref 11.5–14.5)
WBC: 7 10*3/uL (ref 3.6–11.0)

## 2014-01-06 LAB — LIPASE, BLOOD: LIPASE: 105 U/L (ref 73–393)

## 2014-01-06 LAB — TROPONIN I

## 2014-01-09 ENCOUNTER — Emergency Department: Payer: Self-pay | Admitting: Student

## 2014-01-09 LAB — DRUG SCREEN, URINE

## 2014-01-09 LAB — URINALYSIS, COMPLETE
BLOOD: NEGATIVE
Bilirubin,UR: NEGATIVE
GLUCOSE, UR: NEGATIVE mg/dL (ref 0–75)
Ketone: NEGATIVE
Nitrite: NEGATIVE
PH: 5 (ref 4.5–8.0)
PROTEIN: NEGATIVE
RBC,UR: 4 /HPF (ref 0–5)
SPECIFIC GRAVITY: 1.027 (ref 1.003–1.030)
Squamous Epithelial: 12
WBC UR: 17 /HPF (ref 0–5)

## 2014-01-09 LAB — CBC
HCT: 45.2 % (ref 35.0–47.0)
HGB: 14.9 g/dL (ref 12.0–16.0)
MCH: 29.2 pg (ref 26.0–34.0)
MCHC: 32.9 g/dL (ref 32.0–36.0)
MCV: 89 fL (ref 80–100)
PLATELETS: 209 10*3/uL (ref 150–440)
RBC: 5.1 10*6/uL (ref 3.80–5.20)
RDW: 13.9 % (ref 11.5–14.5)
WBC: 6 10*3/uL (ref 3.6–11.0)

## 2014-01-09 LAB — COMPREHENSIVE METABOLIC PANEL
ALBUMIN: 4.1 g/dL (ref 3.4–5.0)
ALK PHOS: 65 U/L
Anion Gap: 5 — ABNORMAL LOW (ref 7–16)
BUN: 11 mg/dL (ref 7–18)
Bilirubin,Total: 0.7 mg/dL (ref 0.2–1.0)
CALCIUM: 9.2 mg/dL (ref 8.5–10.1)
CREATININE: 1.03 mg/dL (ref 0.60–1.30)
Chloride: 111 mmol/L — ABNORMAL HIGH (ref 98–107)
Co2: 22 mmol/L (ref 21–32)
EGFR (African American): >60
Glucose: 104 mg/dL — ABNORMAL HIGH (ref 65–99)
Osmolality: 275 (ref 275–301)
POTASSIUM: 3.6 mmol/L (ref 3.5–5.1)
SGOT(AST): 19 U/L (ref 15–37)
SGPT (ALT): 22 U/L
SODIUM: 138 mmol/L (ref 136–145)
TOTAL PROTEIN: 7.6 g/dL (ref 6.4–8.2)

## 2014-01-09 LAB — ETHANOL

## 2014-01-09 LAB — SALICYLATE LEVEL

## 2014-01-09 LAB — ACETAMINOPHEN LEVEL: Acetaminophen: <2 ug/mL

## 2014-02-06 ENCOUNTER — Emergency Department: Payer: Self-pay | Admitting: Emergency Medicine

## 2014-02-06 LAB — CBC
HCT: 41.6 % (ref 35.0–47.0)
HGB: 13.9 g/dL (ref 12.0–16.0)
MCH: 29.2 pg (ref 26.0–34.0)
MCHC: 33.4 g/dL (ref 32.0–36.0)
MCV: 88 fL (ref 80–100)
Platelet: 249 10*3/uL (ref 150–440)
RBC: 4.75 10*6/uL (ref 3.80–5.20)
RDW: 13.8 % (ref 11.5–14.5)
WBC: 8.7 10*3/uL (ref 3.6–11.0)

## 2014-02-06 LAB — COMPREHENSIVE METABOLIC PANEL
Albumin: 3.8 g/dL (ref 3.4–5.0)
Alkaline Phosphatase: 64 U/L
Anion Gap: 9 (ref 7–16)
BILIRUBIN TOTAL: 0.8 mg/dL (ref 0.2–1.0)
BUN: 12 mg/dL (ref 7–18)
CHLORIDE: 110 mmol/L — AB (ref 98–107)
CREATININE: 0.96 mg/dL (ref 0.60–1.30)
Calcium, Total: 9 mg/dL (ref 8.5–10.1)
Co2: 21 mmol/L (ref 21–32)
EGFR (Non-African Amer.): >60
GLUCOSE: 78 mg/dL (ref 65–99)
OSMOLALITY: 278 (ref 275–301)
Potassium: 3.7 mmol/L (ref 3.5–5.1)
SGOT(AST): 14 U/L — ABNORMAL LOW (ref 15–37)
SGPT (ALT): 22 U/L
Sodium: 140 mmol/L (ref 136–145)
Total Protein: 6.9 g/dL (ref 6.4–8.2)

## 2014-02-06 LAB — TROPONIN I: Troponin-I: <0.02 ng/mL

## 2014-02-06 LAB — URINALYSIS, COMPLETE
Bilirubin,UR: NEGATIVE
Blood: NEGATIVE
Glucose,UR: NEGATIVE mg/dL (ref 0–75)
Nitrite: NEGATIVE
PH: 5 (ref 4.5–8.0)
Protein: NEGATIVE
RBC,UR: 3 /HPF (ref 0–5)
SPECIFIC GRAVITY: 1.018 (ref 1.003–1.030)
Squamous Epithelial: 5

## 2014-02-08 LAB — URINE CULTURE

## 2014-02-09 ENCOUNTER — Emergency Department: Payer: Self-pay | Admitting: Emergency Medicine

## 2014-02-10 ENCOUNTER — Emergency Department: Payer: Self-pay | Admitting: Internal Medicine

## 2014-02-14 ENCOUNTER — Ambulatory Visit: Payer: Self-pay | Admitting: Neurology

## 2014-02-15 ENCOUNTER — Ambulatory Visit: Payer: Self-pay | Admitting: Neurology

## 2014-02-15 ENCOUNTER — Ambulatory Visit: Payer: Self-pay | Admitting: Cardiovascular Disease

## 2014-03-07 ENCOUNTER — Ambulatory Visit: Payer: Self-pay

## 2014-04-14 ENCOUNTER — Emergency Department: Payer: Self-pay | Admitting: Student

## 2014-04-14 LAB — COMPREHENSIVE METABOLIC PANEL
ALBUMIN: 4 g/dL (ref 3.4–5.0)
ALK PHOS: 72 U/L
ANION GAP: 9 (ref 7–16)
AST: 20 U/L (ref 15–37)
BILIRUBIN TOTAL: 0.6 mg/dL (ref 0.2–1.0)
BUN: 13 mg/dL (ref 7–18)
CALCIUM: 9.2 mg/dL (ref 8.5–10.1)
Chloride: 104 mmol/L (ref 98–107)
Co2: 28 mmol/L (ref 21–32)
Creatinine: 1.05 mg/dL (ref 0.60–1.30)
GLUCOSE: 106 mg/dL — AB (ref 65–99)
Osmolality: 282 (ref 275–301)
Potassium: 3.9 mmol/L (ref 3.5–5.1)
SGPT (ALT): 27 U/L
Sodium: 141 mmol/L (ref 136–145)
Total Protein: 7.8 g/dL (ref 6.4–8.2)

## 2014-04-14 LAB — URINALYSIS, COMPLETE
Bilirubin,UR: NEGATIVE
Blood: NEGATIVE
Glucose,UR: NEGATIVE mg/dL (ref 0–75)
KETONE: NEGATIVE
NITRITE: NEGATIVE
Ph: 5 (ref 4.5–8.0)
Protein: NEGATIVE
SPECIFIC GRAVITY: 1.023 (ref 1.003–1.030)
Squamous Epithelial: 46

## 2014-04-14 LAB — CBC WITH DIFFERENTIAL/PLATELET
Basophil #: 0 10*3/uL (ref 0.0–0.1)
Basophil %: 0.5 %
Eosinophil #: 0.1 10*3/uL (ref 0.0–0.7)
Eosinophil %: 0.9 %
HCT: 43.5 % (ref 35.0–47.0)
HGB: 14.5 g/dL (ref 12.0–16.0)
LYMPHS PCT: 25 %
Lymphocyte #: 2.1 10*3/uL (ref 1.0–3.6)
MCH: 29.8 pg (ref 26.0–34.0)
MCHC: 33.4 g/dL (ref 32.0–36.0)
MCV: 89 fL (ref 80–100)
Monocyte #: 0.4 x10 3/mm (ref 0.2–0.9)
Monocyte %: 4.5 %
Neutrophil #: 5.8 10*3/uL (ref 1.4–6.5)
Neutrophil %: 69.1 %
Platelet: 298 10*3/uL (ref 150–440)
RBC: 4.88 10*6/uL (ref 3.80–5.20)
RDW: 13.2 % (ref 11.5–14.5)
WBC: 8.3 10*3/uL (ref 3.6–11.0)

## 2014-04-14 LAB — LIPASE, BLOOD: Lipase: 101 U/L (ref 73–393)

## 2014-04-14 LAB — TROPONIN I: Troponin-I: <0.02 ng/mL

## 2014-04-16 ENCOUNTER — Emergency Department: Payer: Self-pay | Admitting: Emergency Medicine

## 2014-04-16 LAB — CBC
HCT: 42.4 % (ref 35.0–47.0)
HGB: 14 g/dL (ref 12.0–16.0)
MCH: 29.5 pg (ref 26.0–34.0)
MCHC: 32.9 g/dL (ref 32.0–36.0)
MCV: 90 fL (ref 80–100)
Platelet: 260 10*3/uL (ref 150–440)
RBC: 4.73 10*6/uL (ref 3.80–5.20)
RDW: 13.2 % (ref 11.5–14.5)
WBC: 6.2 10*3/uL (ref 3.6–11.0)

## 2014-04-16 LAB — COMPREHENSIVE METABOLIC PANEL
Albumin: 3.7 g/dL (ref 3.4–5.0)
Alkaline Phosphatase: 66 U/L
Anion Gap: 7 (ref 7–16)
BUN: 13 mg/dL (ref 7–18)
Bilirubin,Total: 0.5 mg/dL (ref 0.2–1.0)
Calcium, Total: 8.8 mg/dL (ref 8.5–10.1)
Chloride: 110 mmol/L — ABNORMAL HIGH (ref 98–107)
Co2: 22 mmol/L (ref 21–32)
Creatinine: 0.85 mg/dL (ref 0.60–1.30)
EGFR (African American): >60
GLUCOSE: 106 mg/dL — AB (ref 65–99)
OSMOLALITY: 278 (ref 275–301)
Potassium: 4.1 mmol/L (ref 3.5–5.1)
SGOT(AST): 18 U/L (ref 15–37)
SGPT (ALT): 20 U/L
Sodium: 139 mmol/L (ref 136–145)
Total Protein: 7.1 g/dL (ref 6.4–8.2)

## 2014-04-16 LAB — URINALYSIS, COMPLETE
BILIRUBIN, UR: NEGATIVE
Blood: NEGATIVE
Glucose,UR: NEGATIVE mg/dL (ref 0–75)
Ketone: NEGATIVE
Nitrite: NEGATIVE
PH: 6 (ref 4.5–8.0)
PROTEIN: NEGATIVE
RBC,UR: NONE SEEN /HPF (ref 0–5)
SPECIFIC GRAVITY: 1.021 (ref 1.003–1.030)
WBC UR: 6 /HPF (ref 0–5)

## 2014-04-16 LAB — URINE CULTURE

## 2014-04-16 LAB — LIPASE, BLOOD: LIPASE: 85 U/L (ref 73–393)

## 2014-04-16 LAB — TROPONIN I: Troponin-I: <0.02 ng/mL

## 2014-04-25 ENCOUNTER — Emergency Department: Payer: Self-pay | Admitting: Emergency Medicine

## 2014-04-27 ENCOUNTER — Emergency Department: Payer: Self-pay | Admitting: Emergency Medicine

## 2014-04-27 LAB — URINALYSIS, COMPLETE
BILIRUBIN, UR: NEGATIVE
Glucose,UR: NEGATIVE mg/dL (ref 0–75)
Ketone: NEGATIVE
Leukocyte Esterase: NEGATIVE
NITRITE: NEGATIVE
Ph: 6 (ref 4.5–8.0)
Protein: 100
RBC,UR: 2723 /HPF (ref 0–5)
SPECIFIC GRAVITY: 1.019 (ref 1.003–1.030)
Squamous Epithelial: 2
WBC UR: 6 /HPF (ref 0–5)

## 2014-04-27 LAB — CBC
HCT: 45 % (ref 35.0–47.0)
HGB: 14.7 g/dL (ref 12.0–16.0)
MCH: 29.1 pg (ref 26.0–34.0)
MCHC: 32.8 g/dL (ref 32.0–36.0)
MCV: 89 fL (ref 80–100)
Platelet: 260 10*3/uL (ref 150–440)
RBC: 5.06 10*6/uL (ref 3.80–5.20)
RDW: 13.3 % (ref 11.5–14.5)
WBC: 7.5 10*3/uL (ref 3.6–11.0)

## 2014-04-27 LAB — COMPREHENSIVE METABOLIC PANEL
ALBUMIN: 3.8 g/dL (ref 3.4–5.0)
Alkaline Phosphatase: 76 U/L
Anion Gap: 8 (ref 7–16)
BILIRUBIN TOTAL: 0.5 mg/dL (ref 0.2–1.0)
BUN: 11 mg/dL (ref 7–18)
CALCIUM: 8.9 mg/dL (ref 8.5–10.1)
Chloride: 111 mmol/L — ABNORMAL HIGH (ref 98–107)
Co2: 23 mmol/L (ref 21–32)
Creatinine: 0.87 mg/dL (ref 0.60–1.30)
EGFR (African American): >60
EGFR (Non-African Amer.): >60
Glucose: 96 mg/dL (ref 65–99)
Osmolality: 282 (ref 275–301)
Potassium: 3.8 mmol/L (ref 3.5–5.1)
SGOT(AST): 16 U/L (ref 15–37)
SGPT (ALT): 25 U/L
Sodium: 142 mmol/L (ref 136–145)
Total Protein: 7.6 g/dL (ref 6.4–8.2)

## 2014-04-27 LAB — LIPASE, BLOOD: Lipase: 90 U/L (ref 73–393)

## 2014-05-20 ENCOUNTER — Emergency Department: Payer: Self-pay | Admitting: Emergency Medicine

## 2014-05-20 LAB — COMPREHENSIVE METABOLIC PANEL
ALBUMIN: 3.5 g/dL (ref 3.4–5.0)
ALT: 20 U/L
AST: 20 U/L (ref 15–37)
Alkaline Phosphatase: 67 U/L
Anion Gap: 7 (ref 7–16)
BUN: 9 mg/dL (ref 7–18)
Bilirubin,Total: 0.5 mg/dL (ref 0.2–1.0)
CALCIUM: 9 mg/dL (ref 8.5–10.1)
Chloride: 112 mmol/L — ABNORMAL HIGH (ref 98–107)
Co2: 22 mmol/L (ref 21–32)
Creatinine: 0.79 mg/dL (ref 0.60–1.30)
EGFR (Non-African Amer.): >60
Glucose: 79 mg/dL (ref 65–99)
Osmolality: 279 (ref 275–301)
POTASSIUM: 3.9 mmol/L (ref 3.5–5.1)
Sodium: 141 mmol/L (ref 136–145)
Total Protein: 7.1 g/dL (ref 6.4–8.2)

## 2014-05-20 LAB — CBC
HCT: 40.9 % (ref 35.0–47.0)
HGB: 13.2 g/dL (ref 12.0–16.0)
MCH: 28.5 pg (ref 26.0–34.0)
MCHC: 32.2 g/dL (ref 32.0–36.0)
MCV: 88 fL (ref 80–100)
Platelet: 278 10*3/uL (ref 150–440)
RBC: 4.63 10*6/uL (ref 3.80–5.20)
RDW: 13.5 % (ref 11.5–14.5)
WBC: 8.2 10*3/uL (ref 3.6–11.0)

## 2014-05-20 LAB — TROPONIN I

## 2014-05-20 LAB — D-DIMER(ARMC): D-DIMER: 372 ng/mL

## 2014-06-15 ENCOUNTER — Emergency Department: Payer: Self-pay | Admitting: Emergency Medicine

## 2014-06-15 LAB — COMPREHENSIVE METABOLIC PANEL
ALBUMIN: 3.6 g/dL (ref 3.4–5.0)
ALK PHOS: 72 U/L (ref 46–116)
ANION GAP: 4 — AB (ref 7–16)
BILIRUBIN TOTAL: 0.4 mg/dL (ref 0.2–1.0)
BUN: 17 mg/dL (ref 7–18)
Calcium, Total: 8.7 mg/dL (ref 8.5–10.1)
Chloride: 110 mmol/L — ABNORMAL HIGH (ref 98–107)
Co2: 26 mmol/L (ref 21–32)
Creatinine: 0.88 mg/dL (ref 0.60–1.30)
EGFR (African American): >60
EGFR (Non-African Amer.): >60
GLUCOSE: 93 mg/dL (ref 65–99)
OSMOLALITY: 281 (ref 275–301)
Potassium: 3.9 mmol/L (ref 3.5–5.1)
SGOT(AST): 13 U/L — ABNORMAL LOW (ref 15–37)
SGPT (ALT): 27 U/L (ref 14–63)
SODIUM: 140 mmol/L (ref 136–145)
Total Protein: 7.5 g/dL (ref 6.4–8.2)

## 2014-06-15 LAB — CBC WITH DIFFERENTIAL/PLATELET
BASOS ABS: 0 10*3/uL (ref 0.0–0.1)
Basophil %: 0.5 %
EOS PCT: 1.5 %
Eosinophil #: 0.1 10*3/uL (ref 0.0–0.7)
HCT: 40.5 % (ref 35.0–47.0)
HGB: 13.7 g/dL (ref 12.0–16.0)
LYMPHS ABS: 1.6 10*3/uL (ref 1.0–3.6)
LYMPHS PCT: 18 %
MCH: 29.1 pg (ref 26.0–34.0)
MCHC: 33.8 g/dL (ref 32.0–36.0)
MCV: 86 fL (ref 80–100)
MONOS PCT: 5.3 %
Monocyte #: 0.5 x10 3/mm (ref 0.2–0.9)
Neutrophil #: 6.5 10*3/uL (ref 1.4–6.5)
Neutrophil %: 74.7 %
Platelet: 261 10*3/uL (ref 150–440)
RBC: 4.71 10*6/uL (ref 3.80–5.20)
RDW: 13.9 % (ref 11.5–14.5)
WBC: 8.7 10*3/uL (ref 3.6–11.0)

## 2014-06-15 LAB — URINALYSIS, COMPLETE
Bilirubin,UR: NEGATIVE
Glucose,UR: NEGATIVE mg/dL (ref 0–75)
Ketone: NEGATIVE
Nitrite: NEGATIVE
PH: 5 (ref 4.5–8.0)
PROTEIN: NEGATIVE
RBC,UR: 3 /HPF (ref 0–5)
Specific Gravity: 1.016 (ref 1.003–1.030)
WBC UR: 27 /HPF (ref 0–5)

## 2014-06-17 LAB — URINE CULTURE

## 2014-06-22 ENCOUNTER — Emergency Department: Payer: Self-pay | Admitting: Internal Medicine

## 2014-08-03 ENCOUNTER — Ambulatory Visit: Payer: Self-pay | Admitting: Registered Nurse

## 2014-08-14 ENCOUNTER — Ambulatory Visit
Admit: 2014-08-14 | Disposition: A | Discharge status: Home / Self Care | Payer: Self-pay | Attending: Gastroenterology | Admitting: Gastroenterology

## 2014-08-17 ENCOUNTER — Emergency Department
Admit: 2014-08-17 | Disposition: A | Discharge status: Home / Self Care | Payer: Self-pay | Admitting: Emergency Medicine

## 2014-08-17 LAB — CBC WITH DIFFERENTIAL/PLATELET
BASOS ABS: 0 10*3/uL (ref 0.0–0.1)
Basophil %: 0.5 %
EOS ABS: 0.1 10*3/uL (ref 0.0–0.7)
Eosinophil %: 0.8 %
HCT: 41.8 % (ref 35.0–47.0)
HGB: 13.8 g/dL (ref 12.0–16.0)
Lymphocyte #: 1.7 10*3/uL (ref 1.0–3.6)
Lymphocyte %: 20.4 %
MCH: 28.4 pg (ref 26.0–34.0)
MCHC: 33.1 g/dL (ref 32.0–36.0)
MCV: 86 fL (ref 80–100)
MONO ABS: 0.4 x10 3/mm (ref 0.2–0.9)
Monocyte %: 5.3 %
NEUTROS ABS: 6 10*3/uL (ref 1.4–6.5)
Neutrophil %: 73 %
Platelet: 255 10*3/uL (ref 150–440)
RBC: 4.86 10*6/uL (ref 3.80–5.20)
RDW: 14.1 % (ref 11.5–14.5)
WBC: 8.2 10*3/uL (ref 3.6–11.0)

## 2014-08-17 LAB — BASIC METABOLIC PANEL
ANION GAP: 7 (ref 7–16)
BUN: 12 mg/dL
CREATININE: 0.94 mg/dL
Calcium, Total: 9.5 mg/dL
Chloride: 107 mmol/L
Co2: 25 mmol/L
EGFR (African American): >60
EGFR (Non-African Amer.): >60
Glucose: 84 mg/dL
Potassium: 3.8 mmol/L
Sodium: 139 mmol/L

## 2014-08-20 ENCOUNTER — Ambulatory Visit
Admit: 2014-08-20 | Disposition: A | Discharge status: Home / Self Care | Payer: Self-pay | Attending: Gastroenterology | Admitting: Gastroenterology

## 2014-08-27 NOTE — Consult Note (Signed)
Referral Information:   Reason for Referral Leakage of fluid. Return visit.    Referring Physician [facility]    Prenatal Hx Sara Boyd is a 24 year-old G3 P0111 at 74 4/7 weeks who returns for ultrasound and consultation regarding leaking of fluid during pregnancy.  Sara Boyd was admitted to [facility] on 01/02/12 following a gush of fluid.  Her initital pelvic examination demonstrated ferning and positive nitrazine but no pooling. She reports having had intercourse the day prior.  She was seen by Dr. Dolphus Jenny of [facility] the following day. A detailed ultrasound was obtained which demonstrated normal fluid. The patient declined an amnio-dye test.  Dr. Dolphus Jenny recommeded an FFN, which was obtained and was negative.  A repeat speculum exam demonstrated equivical nitraine, negative pooling and negative ferning.  Her serum WBC count was normal. Her cervix was closed and long and she was afebrile.  Pt has a long history of leaking urine daily since a child. She states that her bladder is "too small" and she had two bladder dilation procedures as a child. Since then she continues to leak urine daily and wears a pad. During her prior pregnancy, she also leaked urine daily. She reports the episode that occured on 8/25 was different as it was more of a gush.  Since being discharge, she has daily urine leakage that is more consistent with her prior history. She denies cramping, contractions, abdominal pain or fever. She had spotting early in pregnancy but none in the last week.  She was admitted for hyperemesis earlier in the pregnancy but symptoms have resovled.    Past Obstetrical Hx G3 P0111 -1 EAB -1 spontaneous vaginal delivery at 35 weeks. Induction of labor following episode of SROM   Home Medications: Medication Instructions Status  Prenatal Multivitamins oral tablet 1 tab(s) orally once a day Active  Reglan 10 mg oral tablet 1 tab(s) orally 3 times a day, As Needed- for Nausea, Vomiting  Active  Vitamin  B6 25 mg oral tablet 1 tab(s) orally 3 times a day, As Needed- for Nausea, Vomiting  Active   Allergies:   Sulfa drugs: Anaphylaxis  Pineapple: Anaphylaxis  Latex: SOB  Vicodin: Alt Ment Status  Codeine: Rash  Penicillin: Pain, Blisters  Vital Signs/Notes:  Nursing Vital Signs: **Vital Signs.:   29-Aug-13 13:06   Pulse Pulse 89   Systolic BP Systolic BP 105   Diastolic BP (mmHg) Diastolic BP (mmHg) 64   Perinatal Consult:   PGyn Hx Denies history of abnormal paps or STDs    Past Medical History cont'd Obesity Hyperemesis grav. Denies history of hypertension, diabetes, endocrine disorders    PSurg Hx "Bladder stretching" cystoscopically as a child, T&A, Umbilical hernia repair    FHx Denies FH of congenital abnormalities or genetic disorders    Soc Hx Denies ETOH, tobacco, drugs   Review Of Systems:   Subjective Leaking fluid. No vaginal bleeding. No cramping or contractions. No abdominal pain.    Fever/Chills No    Cough No    Abdominal Pain No    Nausea/Vomiting No    SOB/DOE No    Dysuria Yes  mild new dysuria    Tolerating Diet Yes   Exam:   Pelvic External normal    Vagina Normal vagina, No pooling at rest or with cough., Normal appearing discharge,    Cervix No lesions, visibly closed and long     Additional Lab/Radiology Notes WBC 5.9 (01/03/12) Detailed anat scan (01/03/12): [facility], see report FFN: negative (01/04/12)  Impression/Recommendations:   Impression 24 year-old G3 P0111 at 4118 4/7 weeks with leakage of fluid.  See Dr. Catha GosselinSmall's consultation from 01/03/12 that is found on the ultrasound report from that date.  Repeat ultrasound today demonstrates normal amniotic fluid volume and pelvic examination today demonstrates no pooling.    Recommendations This is a difficult situation. Given her history of daily urine leakage since a child in the setting of normal pelvic examinatin, negative FFN and normal amniotic fluid volume on  ultrasound, my gut feeling is that this does not represent spontaneous rupture of the membranes.  I did again discuss that should this represent SROM, than she is at risk for pregnancy complications including preterm labor, periviable delivery with severe permanent neonatal handicaps as well as maternal infectious morbidity and need for classical cesarean.  Furthermore, it is possible that she had SROM that has now sealed over and if this is the case, would be at risk for recurrent SROM and preterm delivery.  But again, today, I cant' find any evidence of SROM and her symptoms likely represent leakage of urine as she has had daily since a young child.  I offered amnio dye testing for a definitive diagnosis but she declined stating that the results would not change how she managed the pregnancy. We again reviewed signs and symptoms of infection.  [facility] and together made the following plan.  Recs: -Pt to call [facility] should she develop fever, change in leakage, cramping, contractions or abdominal pain.  Should her symptoms change, or if you have any questions, please feel free to send her back to us for evaluation. -She [facility]. Recommend ultrasound weekly to every other week to assess amniotic fluid volume. We have cancelled her appointments here but will be happy [facility] if desired -Please contact us with any questions -Please offer 17P as she had a history of preterm delivery at 35 weeks in setting of SROM. -Pt declined amnio dye test. -Please offer aneuploidy screening if haven't already done so.     Total Time Spent with Patient 30 minutes    >50% of visit spent in couseling/coordination of care yes    Office Use Only [address]9214  Office Visit Level 4 (25min) EST detailed office/outpt   Coding Description: MATERNAL CONDITIONS/HISTORY INDICATION(S).    OTHER: rule out SROM.  Electronic Signatures: Sara Boyd, Sara Boyd (MD)  (Signed 29-Aug-13 15:02)  Authored: Referral, Home Medications, Allergies, Vital Signs/Notes, Consult, Exam, Lab/Radiology Notes, Impression, Billing, Coding Description   Last Updated: 29-Aug-13 15:02 by Sara Boyd, Sara Boyd (MD)

## 2014-08-27 NOTE — H&P (Signed)
PATIENT NAME:  [name], Sara N MR#:  [id number] DATE OF BIRTH:  07-25-1990  DATE OF ADMISSION:  12/10/2011  The patient is a 24 year old G2, P1 who is at 15 weeks of pregnancy who is having some type of syncopal symptoms, was seen by her PCP today and referred to the ER. EKG is totally normal. All electrolytes are normal. The patient has been suffering from diarrhea for the last several days but is drinking large amounts of sweetened tea. In fact in the ER room is eating a quarter pounder with cheese from [employer] and then complaining of nausea and vomiting, not seen by anyone but told to be by the family members. The patient's grandmother states that she was in the car on the way to the doctor's office, and the patient was unresponsive. She did not have loss of urine. She did not have loss of bowel continence. She then was awake. She has not ever fallen. They are more of a swooning type sensation.  Patient's blood pressure is normal; it is not low. All of her electrolytes are totally normal. Her creatinine is normal. EKG is normal. The patient is having severe reflux. The patient will be sent home on Zantac 150 mg p.o. b.i.d. If the symptoms continue the patient will then be sent for an EGD for possible petit mal type seizures. Patient is told to stop drinking Gatorade and sweetened drinks but instead to drink Nuun with water and Pedialyte and to eat more proteins in smaller amounts throughout the day. For constipation she is told to take Benefiber in her water and push her water and stop the tea which is also dehydrating. The patient and grandmother expressed understanding. The ER doctor has cleared the patient, and the patient will be discharged to home. She will follow up on Wednesday.    ____________________________ Elliot Gurneyarrie C. Abiola Behring, MD cck:vtd D: 12/10/2011 18:10:34 ET T: 12/11/2011 05:34:45 ET JOB#: [id number]  cc: Elliot Gurneyarrie C. Laykin Rainone, MD, <Dictator> Elliot GurneyARRIE C Rickell Wiehe MD ELECTRONICALLY SIGNED  12/13/2011 23:18

## 2014-08-27 NOTE — Consult Note (Signed)
Chief Complaint:   Subjective/Chief Complaint flank pain improved; afebrile   VITAL SIGNS/ANCILLARY NOTES: **Vital Signs.:   09-Oct-13 11:45   Vital Signs Type Routine   Temperature Temperature (F) 97.7   Celsius 36.5   Temperature Source oral   Pulse Pulse 88   Respirations Respirations 18   Systolic BP Systolic BP 101   Diastolic BP (mmHg) Diastolic BP (mmHg) 68   Mean BP 79   Assessment/Plan:  Assessment/Plan:   Assessment 1.UTI- feeling better. Urine cx positive for proteus and e. coli both sens to cefoxitin 2. Left hydronephrosis-cannot exclude ureteral calculus    Plan 1. Cont antibiotic 2. Can stent for persistent pain   Electronic Signatures: Riki AltesStoioff, Amyri Frenz C (MD)  (Signed 09-Oct-13 15:06)  Authored: Chief Complaint, VITAL SIGNS/ANCILLARY NOTES, Assessment/Plan   Last Updated: 09-Oct-13 15:06 by Riki AltesStoioff, Beyounce Dickens C (MD)

## 2014-08-27 NOTE — Consult Note (Signed)
PATIENT NAME:  Sara Boyd, Sara Boyd MR#:  [id number] DATE OF BIRTH:  04-01-1991  DATE OF CONSULTATION:  02/15/2012  REFERRING PHYSICIAN:  Dr. Jean RosenthalJackson  CONSULTING PHYSICIAN:  Lorin PicketScott C. Lonna CobbStoioff, MD  REASON FOR CONSULTATION: Hydronephrosis/urinary tract infection.  HISTORY OF PRESENT ILLNESS: The patient is a 24 year old G2 female with an intrauterine pregnancy at 23 weeks who presented to the Emergency Department early this morning complaining of left pelvic/left lower quadrant pain that radiated in the left flank region. It occurred suddenly and has been associated with nausea and vomiting. She was evaluated two days ago in Labor and Delivery for dysuria and frequency and was started on Macrobid. She was admitted for pain control and further evaluation. A renal ultrasound was performed which did show left hydronephrosis and hydroureter. She denies fever. She has had chronic nausea and vomiting during her entire pregnancy. She had recurrent urinary tract infections with her first pregnancy but no flank pain. She does have a history of recurrent urinary tract infections since childhood and had previously seen Dr. Evelene CroonWolff.   PAST MEDICAL HISTORY:  1. Anxiety/depression.  2. OCD.  3. History of interstitial cystitis.   PAST SURGICAL HISTORY:  1. Herniorrhaphy.  2. Tonsillectomy.  3. Urethral dilation.   MEDICATIONS ON ADMISSION:  1. Prenatal vitamins. 2. Macrobid. 3. Pyridium.   ALLERGIES: Penicillin, sulfa, codeine, hydrocodone.  REVIEW OF SYSTEMS: Otherwise noncontributory except as per the history of present illness.   PHYSICAL EXAMINATION:   VITAL SIGNS: Temperature 98.7, pulse 78, blood pressure 110/68.   GENERAL: Cooperative female in no acute distress.   ABDOMEN: Gravid. Mild left lower quadrant tenderness.   BACK: No CVA tenderness.  LABORATORY, DIAGNOSTIC, AND RADIOLOGICAL DATA: Preliminary urine culture growing greater than 100,000 gram-negative rods, two different organisms.  ID sensitivities pending.   Renal ultrasound does show moderate left hydronephrosis and hydroureter. No stone is seen. A previous ultrasound from September 28th showed mild left hydronephrosis.   IMPRESSION:  1. Urinary tract infection.  2. Left hydronephrosis. This has increased from a prior ultrasound of September 28th. This could be physiologic, although this is more on the contralateral side. The possibility of a ureteral calculus was discussed. Her pain could potentially due to a calculus, however, she also has a urinary tract infection.   RECOMMENDATION:  1. Options were discussed of continuing antibiotics and pain medication to see if she continues with clinical improvement.  2. The alternative of cystoscopy and left ureteroscopy was discussed. It was stressed that only the distal ureter need only to be examined and if no stone was seen a stent will be placed. She has a history of interstitial cystitis and recurrent urinary tract infections and could have significant stent irritative symptoms. At present she is leaning towards observation. Will follow and check back in the morning.   ____________________________ Verna CzechScott C. Lonna CobbStoioff, MD scs:drc D: 02/15/2012 13:40:56 ET T: 02/15/2012 14:52:19 ET JOB#: [id number]  cc: Lorin PicketScott C. Lonna CobbStoioff, MD, <Dictator> Riki AltesSCOTT C Xzaiver Vayda MD ELECTRONICALLY SIGNED 02/16/2012 15:28

## 2014-08-31 NOTE — Consult Note (Signed)
PATIENT NAME:  Sara Boyd, Sara Boyd MR#:  [id number] DATE OF BIRTH:  May 18, 1990  DATE OF CONSULTATION:  01/09/2014  CONSULTING PHYSICIAN:  Audery Amel, MD  IDENTIFYING INFORMATION AND REASON FOR CONSULT: A 24 year old woman with a past history of OCD presented to the hospital stating she felt like she was going crazy.   HISTORY OF PRESENT ILLNESS: Information obtained from the patient and the chart. The patient reports escalating waning symptoms over the past few weeks of anxiety with specific symptoms of feeling like she is losing her mind. She is having intrusive thoughts of worry. She has intrusive thoughts that someone might have poisoned her food or poisoned her children's food. At night, she is having difficulty sleeping because of intrusive thoughts, most recently having intrusive thoughts about hell last night. Importantly, she is completely in her right mind as far as insight about this. She understands that her fears are absurd and overblown. She is not actually having delusions or hallucinations. Her mood is extremely anxious. Last night, she had a great deal of difficulty sleeping. She has been feeling a little bit more depressed. She denies having any suicidal ideation whatsoever. No homicidal ideation. Currently, she is taking Tegretol 100 mg a day for trigeminal neuralgia, but no other psychiatric medicine.   PAST PSYCHIATRIC HISTORY: The patient had a diagnosis of OCD, which occurred at the beginning of her last pregnancy, which makes it a little over 2 years ago. She was started on Prozac, Abilify and clonazepam simultaneously and had good response. Unfortunately, she learned shortly thereafter that she was pregnant and was taken off of all of her medicine. She has continued to have some OCD symptoms since then, but things have really escalated in the last month or 2. No history of suicide attempts. No history of psychiatric hospitalization.   PAST MEDICAL HISTORY: Trigeminal  neuralgia. Currently taking 100 mg of Tegretol, which is not helping. Recent urinary tract infection taking 500 mg of ciprofloxacin twice a day for the last few days. Otherwise, no medical problems.   SOCIAL HISTORY: The patient is married, has 2 young children, ages 52 and 64 months. Relationship with her husband is good. 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   FAMILY HISTORY: Father has had some recent psychotic symptoms, which may be dementia. Otherwise, no clear family history.   SUBSTANCE ABUSE HISTORY: Does not drink regularly or abuse drugs. No history of drug or alcohol abuse.   CURRENT MEDICATIONS: Ciprofloxacin 500 mg twice a day, just for the last 3 to 4 days and Tegretol 100 mg once a day.   ALLERGIES: CODEINE, PENICILLIN, SULFA DRUGS, VICODIN AND LATEX.   REVIEW OF SYSTEMS: Intrusive, intense fears that she is dying or that something bad is going to happen to her. A great deal of anxiety. Difficulty sleeping last night. Denies suicidal or homicidal ideation. Denies hallucinations. No other physical symptoms. The rest of the review of systems is negative.   MENTAL STATUS EXAMINATION: Neatly groomed young woman, looks her stated age, cooperative and pleasant. Good eye contact, normal psychomotor activity. Speech normal in rate, tone and volume. Affect anxious but reactive. Mood stated as nervous. Thoughts are lucid with no loosening of associations. No sign of delusions. Denies auditory or visual hallucinations. Denies suicidal or homicidal ideation. Good insight and judgment. Normal intelligence. Alert and oriented x 4. Remembers 3/3 objects immediately and at 3 minutes.   LABORATORY RESULTS: The urinalysis continues to show signs of infection  despite her having been on antibiotics for several days. Alcohol level negative. Chemistry panel: No significant abnormalities. Drug screen negative. Hematology panel negative. Pregnancy test negative.   PHYSICAL  EXAMINATION: GENERAL: Full physical not done, but the patient does not appear to be in any distress.  NEUROLOGIC: Able to walk without difficulty. All extremities move appropriately. Cranial nerves intact.  VITAL SIGNS: Blood pressure 157/87, respirations 18, pulse 83, temperature 98.   ASSESSMENT: A 24 year old woman who I think has obsessive-compulsive disorder. No evidence of dangerousness. Symptoms are very typical for obsessive-compulsive disorder. I do not think this represents a psychotic disorder. The patient definitely requires treatment for improvement of functioning and relief of symptoms, but does not require inpatient treatment.   TREATMENT PLAN: Education and counseling, supportive therapy and reassurance done with the patient. I assured her that this was a treatable condition. Described the appropriate treatment of the combination of psychotherapy and medication. My recommendation is not to restart 3 drugs at once, but to simply restart the fluoxetine for now as the most appropriate drug for obsessive-compulsive disorder. I suggest that I give her a prescription for 20 mg of fluoxetine a day, 30 days, no refills. The patient already has [facility] a [facility]. The patient is completely agreeable to the plan. No need for inpatient hospitalization. Hospital emergency room physician agrees. I also pointed out her urinary tract infection. I think the emergency room doctor will address that before she leaves.   DIAGNOSIS: Principal and primary:  AXIS I: Obsessive-compulsive disorder.   SECONDARY DIAGNOSES: AXIS I: No further.  AXIS II: No further.  AXIS III: Urinary tract infection, trigeminal neuralgia.  AXIS V: Functioning at time of assessment 54.      ____________________________ Audery AmelJohn T. Clapacs, MD jtc:TT D: 01/09/2014 19:13:20 ET T: 01/09/2014 20:31:04 ET JOB#: [id number]  cc: Audery AmelJohn T. Clapacs, MD, <Dictator> Audery AmelJOHN T CLAPACS MD ELECTRONICALLY  SIGNED 01/18/2014 17:00

## 2014-09-02 LAB — SURGICAL PATHOLOGY

## 2014-09-11 ENCOUNTER — Other Ambulatory Visit: Payer: Self-pay | Admitting: Gastroenterology

## 2014-09-11 DIAGNOSIS — R1084 Generalized abdominal pain: Secondary | ICD-10-CM

## 2014-09-12 ENCOUNTER — Ambulatory Visit
Admission: RE | Admit: 2014-09-12 | Discharge: 2014-09-12 | Disposition: A | Discharge status: Home / Self Care | Payer: BLUE CROSS/BLUE SHIELD | Source: Ambulatory Visit | Attending: Gastroenterology | Admitting: Gastroenterology

## 2014-09-12 DIAGNOSIS — R1084 Generalized abdominal pain: Secondary | ICD-10-CM | POA: Diagnosis not present

## 2014-09-12 MED ORDER — IOHEXOL 300 MG/ML  SOLN
100.0000 mL | Freq: Once | INTRAMUSCULAR | Status: AC | PRN
  Administered 2014-09-12: 100 mL via INTRAVENOUS

## 2014-09-13 ENCOUNTER — Other Ambulatory Visit: Payer: Self-pay | Admitting: Gastroenterology

## 2014-09-13 DIAGNOSIS — R1084 Generalized abdominal pain: Secondary | ICD-10-CM

## 2014-09-17 ENCOUNTER — Other Ambulatory Visit: Payer: Self-pay | Admitting: Urology

## 2014-09-17 DIAGNOSIS — N3090 Cystitis, unspecified without hematuria: Secondary | ICD-10-CM

## 2014-09-17 DIAGNOSIS — R351 Nocturia: Secondary | ICD-10-CM

## 2014-09-17 NOTE — H&P (Signed)
L&D Evaluation:  History Expanded:   HPI 24 yo G2P1, EDD of 06/09/12 who presents at 5431 2/7 weeks with c/o lower left abdominal and back/flank pain. She states sx started 2 days ago as "UTI-like" discomfort, with pain becoming more intense today. She also reports nausea and has been unable to keep down fluids today.  Pt was admitted at 23 weeks with c/o similar pain (pt states pain is less severe this time.) Renal US at that time found moderate left hydronephrosis and proximal left hydroureter. Pt was tx conservatively with IV Mefoxitin & Demerol for pain and d/c home after 2 days. Urology was involved in care as well. She has not had f/u outpatient with them yet.   PNC notable for admission for hyperemesis, questionable PPROM at 18 weeks ([facility] evaluated pt, believed she was not PPROM or leak was re-sealed), BMI 43, Anxiety/depression/OCD for which pt was referred to Psychiatrist and multiple syncopal episodes for which pt is being referred to Neurology. Past hx of SVD at 37 weeks delivering a 5#1oz baby.    Blood Type (Maternal) O positive    Group B Strep Results Maternal (Result >5wks must be treated as unknown) unknown/result > 5 weeks ago    Maternal HIV Negative    Maternal Syphilis Ab Nonreactive    Maternal Varicella Immune    Rubella Results (Maternal) immune    Maternal T-Dap Unknown    Presents with abdominal pain, back pain, nausea/vomiting    Patient's Medical History Obesity, OCD, anxiety, depression, interstitial cystitis    Patient's Surgical History Hernia repair, tonsillectomy, urethral dilation    Medications Pre Natal Vitamins    Allergies PCN, Sulfa, Codeine, vicodin    Social History none    Family History Non-Contributory   ROS:   ROS see HPI   Exam:   Vital Signs stable    General appears somewhat uncomfortable    Mental Status clear    Chest clear    Heart no murmur/gallop/rubs    Abdomen soft, LLQ tenderness    Back + Left CVAT     Edema no edema    Pelvic no external lesions, 1/50/OOP    Mebranes Intact    FHT 150, min-mod variability (difficulty maintaining continuous monitoring)    Ucx absent    Skin dry    Other UA: 3+ blood, neg ketones, 100 protein, 3+ leuk, 339 RBC, 420 WBC, 1+ Bacteria, 2 epithelial, mucous present   Impression:   Impression IUP at 31 2/7 weeks with suspected hydronephrosis   Plan:   Comments Renal US ordered - will probably consult Urology again if hydronephrosis/hydroureter persists Mefoxitin 2 grams IV q 6 hrs IV Zofran for nausea May consider po pain medication prn for pain, pt states she is "ok" for now   Electronic Signatures: Vella KohlerBrothers, Kim Lauver K (CNM)  (Signed 01-Dec-13 14:51)  Authored: L&D Evaluation   Last Updated: 01-Dec-13 14:51 by Vella KohlerBrothers, Semaje Kinker K (CNM)

## 2014-09-17 NOTE — H&P (Signed)
L&D Evaluation:  History Expanded:   HPI 24 yo G2P1, EDD of 06/09/12. Presents with c/o diffuse abdominal pain since 9/25 with worsening pressure and pain in "pelvic area" for the last 1-2 days. Pt was seen in office 3 days ago with similar sx. Urine c&s was negative. Pt does not have thermometer, but she did have chills last night.     PNC notable for admission for hyperemesis, questionable PPROM at 18 weeks ([facility] evaluated pt, believed she was not PPROM or leak was re-sealed), BMI 43, Anxiety/depression/OCD for which pt was referred to Psychiatrist and multiple syncopal episodes for which pt is being referred to Neurology.    Gravida 2    Term 1    PreTerm 0    Abortion 0    Living 1    Blood Type (Maternal) O positive    Group B Strep Results Maternal (Result >5wks must be treated as unknown) unknown/result > 5 weeks ago    Maternal HIV Negative    Maternal Syphilis Ab Nonreactive    Maternal Varicella Immune    Rubella Results (Maternal) immune    Patient's Medical History OCD, anxiety, depression, interstitial cystitis    Patient's Surgical History Hernia repair, tonsillectomy, urethral dilation    Medications Pre Natal Vitamins  zofran, reglan, protonix    Allergies PCN, Sulfa, Codeine, vicodin    Social History none    Family History Non-Contributory   ROS:   ROS All systems were reviewed.  HEENT, CNS, GI, GU, Respiratory, CV, Renal and Musculoskeletal systems were found to be normal.   Exam:   Vital Signs stable    General no apparent distress    Mental Status clear    Abdomen gravid, soft, tender per pt's report, no rebound tenderness    Estimated Fetal Weight Average for gestational age    Back no CVAT    Edema no edema    Pelvic no external lesions, cervix closed and thick, wet mount: negative    FHT + FHTs    Ucx absent    Skin dry    Other UA: 30 protein, neg nitrite, 2+ leuk, 2 RBC, 13 WBC, trace bacteria, 8 epi, calcium oxalate  crystal present, hyaline cast: 3   Impression:   Impression IUP at 22 weeks with abdominal pain   Plan:   Comments Discussed possible flair up of interstitial cystitis. Pt given list of foods to avoid. Encouraged to continue Protonix, nausea meds and simethicone as pain could be GI in origin.  Will order renal US to r/o stones.    Follow Up Appointment already scheduled. 10/10   Electronic Signatures: Alric Geise, Marta Lamasamara K (CNM)  (Signed 28-Sep-13 21:35)  Authored: L&D Evaluation   Last Updated: 28-Sep-13 21:35 by Vella KohlerBrothers, Sutter Ahlgren K (CNM)

## 2014-09-17 NOTE — H&P (Signed)
L&D Evaluation:  History:   HPI 24 yo G2P1, EDD of 06/09/12 who presents at 4731 5/7 weeks with c/o abdominal pain. She has been treated for the past 2-3 days with keflex for her UTI, however she has been unable to keep the last two pills down due to nausea and throwing up the medication.  She continues to have pain from the infection.  Pt was admitted at 23 weeks with pain in left flank pain. Renal US at that time found moderate left hydronephrosis and proximal left hydroureter. Pt was tx conservatively with IV Mefoxitin & Demerol for pain and d/c home after 2 days. Urology was involved in care as well. She has not had f/u outpatient with them yet.   PNC notable for admission for hyperemesis, questionable PPROM at 18 weeks ([facility] evaluated pt, believed she was not PPROM or leak was re-sealed), BMI 43, Anxiety/depression/OCD for which pt was referred to Psychiatrist and multiple syncopal episodes for which pt is being referred to Neurology. Past hx of SVD at 37 weeks delivering a 5#1oz baby.    Presents with abdominal pain, nausea/vomiting    Patient's Medical History Obesity, OCD, anxiety, depression, interstitial cystitis    Patient's Surgical History Hernia repair, tonsillectomy, urethral dilation    Medications Pre Natal Vitamins    Allergies PCN, Sulfa, Codeine, vicodin    Social History none    Family History Non-Contributory   ROS:   ROS see HPI   Exam:   Vital Signs stable    General no apparent distress    Mental Status clear    Chest clear    Heart no murmur/gallop/rubs    Abdomen gravid, non-tender    Back no CVAT    Edema no edema    Pelvic no external lesions, FT/thick    Mebranes Intact    FHT 150, min-mod variability (difficulty maintaining continuous monitoring), ? variable decelerations vs occasional marked variability (duration of FHR drop < 15sec).    Ucx absent    Skin dry    Other UA: neg blood, neg ketones, neg protein, 2+ leuk, 2 RBC, 49 WBC,  3+ Bacteria, 4 epithelial, mucous present   Impression:   Impression IUP at 31 5/7 weeks with suspected hydronephrosis, UTI incompletely treated   Plan:   Plan discharge    Comments Gave dose of keflex to assess tolerance on L&D.  Will increase intensity of treatment given that she is likely a complicated UTI.  So will give a total of 10 days of treatment with Keflex 500mg  4 times per day.  I also gave her zofran and phenergan to help with nausea.  She has an appt tomorrow at [id number] with Tammy Brothers for follow up.    Follow Up Appointment already scheduled   Electronic Signatures: Conard NovakJackson, Meliyah Simon D (MD)  (Signed 05-Dec-13 21:10)  Authored: L&D Evaluation   Last Updated: 05-Dec-13 21:10 by Conard NovakJackson, Baer Hinton D (MD)

## 2014-09-17 NOTE — H&P (Signed)
L&D Evaluation:  History Expanded:   HPI 24 yo G2P1, EDD of 06/09/12. presents at 26 weeks with c/o lower abdominal and leakage of fluid that was either incontinence or ROM.  She has a hx of frequent UTIs. Denies hx of kidney stones.  She has had nausea and vomiting during her entire pregnancy.  PNC notable for admission for hyperemesis, questionable PPROM at 18 weeks ([facility] evaluated pt, believed she was not PPROM or leak was re-sealed), BMI 43, Anxiety/depression/OCD for which pt was referred to Psychiatrist and multiple syncopal episodes for which pt is being referred to Neurology. Past hx of SVD at 37 weeks delivering a 5#1oz baby.    Presents with abdominal pain, back pain, nausea/vomiting    Patient's Medical History Obesity, OCD, anxiety, depression, interstitial cystitis    Patient's Surgical History Hernia repair, tonsillectomy, urethral dilation    Medications Pre Natal Vitamins  Macrobid and Pyridium    Allergies PCN, Sulfa, Codeine, vicodin    Social History none    Family History Non-Contributory   ROS:   ROS see HPI   Exam:   Vital Signs stable    General no apparent distress    Mental Status clear    Chest clear    Heart normal sinus rhythm, no murmur/gallop/rubs    Abdomen gravid, obese female, ND/soft    Estimated Fetal Weight Average for gestational age    Back no CVAT    Edema no edema    Pelvic no external lesions, cervix closed and thick    Mebranes Intact, NEG NITRAZINE and FERN and POOLING    FHT 150    Ucx absent    Skin dry   Impression:   Impression IUP at 26 weeks withabd pain and incontinence.  No ROM.   Plan:   Plan EFM/NST, fluids    Comments Reassurance   Electronic Signatures: Letitia LibraHarris, Serigne Kubicek Paul (MD)  (Signed 25-Oct-13 20:41)  Authored: L&D Evaluation   Last Updated: 25-Oct-13 20:41 by Letitia LibraHarris, Lorenso Quirino Paul (MD)

## 2014-09-17 NOTE — H&P (Signed)
L&D Evaluation:  History:   HPI 24 yo G2P1, EDD of 06/09/12. presents at 23 3/7 weeks with c/o lower abdominal and left back pain/flank pain. The left flank pain began suddenly at 1830 last night. She was seen in L&D on Sunday evening for painful urination and urinary frequency and was given a RX for Macrobid. She took one dose yesterday, but vomited right after that.. Her urine culture is growing >100K of GM neg rods. She has a hx of frequent UTIs. Denies hx of kidney stones.  She was also seen ion L&D on 9/28 for  diffuse abdominal and pelvic pain and calcium oxalate crystals were seen in her urine. She has had nausea and vomiting during her entire pregnancy, but this pain does make the nausea worse. She vomited early 10/7 and vomited again shortly after arrival to the unit. No diarrhea. no fever. Some spotting yesterday.   PNC notable for admission for hyperemesis, questionable PPROM at 18 weeks ([facility] evaluated pt, believed she was not PPROM or leak was re-sealed), BMI 43, Anxiety/depression/OCD for which pt was referred to Psychiatrist and multiple syncopal episodes for which pt is being referred to Neurology. Past hx of SVD at 37 weeks delivering a 5#1oz baby.    Presents with abdominal pain, back pain, nausea/vomiting    Patient's Medical History Obesity, OCD, anxiety, depression, interstitial cystitis    Patient's Surgical History Hernia repair, tonsillectomy, urethral dilation    Medications Pre Natal Vitamins  Macrobid and Pyridium    Allergies PCN, Sulfa, Codeine, vicodin    Social History none    Family History Non-Contributory   ROS:   ROS see HPI   Exam:   Vital Signs stable  120/68, 124/66     General appears uncomfortable    Mental Status clear    Chest clear    Heart normal sinus rhythm, no murmur/gallop/rubs    Abdomen gravid, obese female, tenderness in LLQ, BS active, ND/soft    Back no CVAT    Pelvic no external lesions, L/C/T/OOP    Mebranes Intact     FHT 150    Ucx absent    Skin dry   Impression:   Impression IUP at 23 3/7 weeks with left flank pain. UTI-probably due to E.coli. R/O pyelo, R/O urolithiasis   Plan:   Plan Has been given 37.5 mgm Demerol IV with little relief of pain. Phenergan and Zofran for nausea/vomiting. Mefoxin 2 gm q6 hr IVPB already begun. Kidney ultrasound ordered.    Comments 0503: Kidney ultrasound reveals moderate left hydronephrosis and proximal left hydroureter. Will start a Demerol PCA and get a urology consult this Am. CBC and BMP this AM.   Electronic Signatures: Trinna BalloonGutierrez, Dailyn Reith L (CNM)  (Signed 08-Oct-13 05:05)  Authored: L&D Evaluation   Last Updated: 08-Oct-13 05:05 by Trinna BalloonGutierrez, Areesha Dehaven L (CNM)

## 2014-09-17 NOTE — H&P (Signed)
L&D Evaluation:  History Expanded:   HPI 24 yo G2P1, EDD of 06/09/12. 8 weeks.  Presents w episodes of vaginal leakage, first gush yesterday at 1500, then again this am with some spotting, and later today as well. No fever.  No contractions.     PNC notable for admission for hyperemesis, BMI 43, Anxiety/depression/OCD for which pt was referred to Psychiatrist and multiple syncopal episodes for which pt is being referred to Neurology.    Gravida 2    Term 1    PreTerm 0    Abortion 0    Living 1    Blood Type (Maternal) O positive    Group B Strep Results Maternal (Result >5wks must be treated as unknown) unknown/result > 5 weeks ago    Maternal HIV Negative    Maternal Syphilis Ab Nonreactive    Maternal Varicella Immune    Rubella Results (Maternal) immune    Patient's Medical History OCD, anxiety, depression    Patient's Surgical History Hernia repair, tonsillectomy, urethral dilation    Medications Pre Natal Vitamins  zofran    Allergies PCN, Sulfa, Codeine, vicodin    Social History none    Family History Non-Contributory   ROS:   ROS All systems were reviewed.  HEENT, CNS, GI, GU, Respiratory, CV, Renal and Musculoskeletal systems were found to be normal.   Exam:   Vital Signs stable    General no apparent distress    Mental Status clear    Chest clear    Heart normal sinus rhythm    Abdomen gravid, non-tender    Estimated Fetal Weight Average for gestational age    Back no CVAT    Edema no edema    Pelvic no external lesions, SSE- -pool, +Nitrazine, +FERN.  SVE- closed thick high.    Mebranes Ruptured    Description clear    Skin dry   Impression:   Impression IUP at 18 weeks, PPROM.   Plan:   Comments Direct admit to 3rd floor Ultrasound IVF IV ABX CBC  Counseled on risks and poor prognosis of PPROM at 18 weeks.  MFM consult in am.  Monitor for infection.    Follow Up Appointment already scheduled. 8/22   Electronic  Signatures: Letitia LibraHarris, Sarit Sparano Paul (MD)  (Signed 25-Aug-13 19:11)  Authored: L&D Evaluation   Last Updated: 25-Aug-13 19:11 by Letitia LibraHarris, Geran Haithcock Paul (MD)
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
## 2014-09-17 NOTE — H&P (Signed)
L&D Evaluation:  History Expanded:   HPI 24 yo G2P1, EDD of 06/09/12. Presents from office at 15 6/7 weeks for IV hydration following severe vomiting and diarrhea from gastroenteritis. Pt reports husband and daughter have had similar symptoms. Pt with fever this am, temp stable at office. Unable to give urine sample to check ketones.   PNC notable for admission for hyperemesis, BMI 43, Anxiety/depression/OCD for which pt was referred to Psychiatrist and multiple syncopal episodes for which pt is being referred to Neurology.    Blood Type (Maternal) O positive    Group B Strep Results Maternal (Result >5wks must be treated as unknown) unknown/result > 5 weeks ago    Maternal HIV Negative    Maternal Syphilis Ab Nonreactive    Maternal Varicella Immune    Rubella Results (Maternal) immune    Patient's Medical History OCD, anxiety, depression    Patient's Surgical History Hernia repair, tonsillectomy, urethral dilation    Medications Pre Natal Vitamins  zofran    Allergies PCN, Sulfa, Codeine, vicodin    Social History none   ROS:   ROS see HPI   Exam:   Vital Signs stable    General other, appears ill    Mental Status clear    Abdomen gravid, tender to palpation    Pelvic deferred    Mebranes Intact   Impression:   Impression IUP at 15 6/7 weeks, gastroenteritis   Plan:   Plan UA    Comments Direct admit to 3rd floor Metabolic panel, IV fluids, IV phenergan or Zofran prn    Follow Up Appointment already scheduled. 8/22   Electronic Signatures: Sara Boyd, Sara Boyd (CNM)  (Signed 15-Aug-13 13:06)  Authored: L&D Evaluation   Last Updated: 15-Aug-13 13:06 by Sara Boyd, Sara Boyd (CNM)

## 2014-09-17 NOTE — H&P (Signed)
L&D Evaluation:  History Expanded:   HPI 24 yo G2P1, EDD of 06/09/12 who presents at 4135 1/7 weeks with c/o abdominal pain and decreased FM. Pt was recenty treated for UTI with Keflex, had repeat urine culture sent yesterday from the office.  Pt was admitted at 23 weeks with pain in left flank pain. Renal US at that time found moderate left hydronephrosis and proximal left hydroureter. Pt was tx conservatively with IV Mefoxitin & Demerol for pain and d/c home after 2 days. Urology was involved in care as well. She has not had f/u outpatient with them yet.   PNC notable for admission for hyperemesis, questionable PPROM at 18 weeks ([facility] evaluated pt, believed she was not PPROM or leak was re-sealed), BMI 43, Anxiety/depression/OCD for which pt was referred to Psychiatrist and multiple syncopal episodes for which pt is being referred to Neurology. Past hx of SVD at 37 weeks delivering a 5#1oz baby.    Blood Type (Maternal) O positive    Group B Strep Results Maternal (Result >5wks must be treated as unknown) unknown/result > 5 weeks ago    Maternal HIV Negative    Maternal Syphilis Ab Nonreactive    Maternal Varicella Immune    Rubella Results (Maternal) immune    Maternal T-Dap Unknown    Presents with abdominal pain, nausea/vomiting    Patient's Medical History Obesity, OCD, anxiety, depression, interstitial cystitis    Patient's Surgical History Hernia repair, tonsillectomy, urethral dilation    Medications Pre Natal Vitamins    Allergies PCN, Sulfa, Codeine, vicodin, latex    Social History none    Family History Non-Contributory   ROS:   ROS see HPI   Exam:   Vital Signs stable    General no apparent distress    Mental Status clear    Abdomen gravid, tenderness per pt, no contractions per toco    Back no CVAT    Edema no edema    Pelvic no external lesions, FT/thick/high    Mebranes Intact    FHT normal rate with no decels, initially moderate variability and  10x10 accelerations, after US fetus with active movement, 15x15 accelerations, BL 140s, mod variability, no decels    Ucx absent    Skin dry    Other AFI 14.7 cm   Impression:   Impression IUP at 35 1/7 weeks with reassuring fetal surveillance   Plan:   Plan discharge    Comments Pt reports feeling reassured after reactive NST and normal AFI. [facility]. Con't to monitor fetal movement/FKC's. Heat application for discomfort.    Follow Up Appointment already scheduled   Electronic Signatures: Vihana Kydd, Marta Lamasamara K (CNM)  (Signed 28-Dec-13 23:43)  Authored: L&D Evaluation   Last Updated: 28-Dec-13 23:43 by Vella KohlerBrothers, Glade Strausser K (CNM)

## 2014-09-17 NOTE — H&P (Signed)
L&D Evaluation:  History:   HPI 24 year old G2P1 presents to L&D for prolonged monitoring due to non reactive NST in the office. She is 38 weeks 5 days today with EDD 06/09/12. [facility], notable for early entry to care, obesity (BMI 43), multiple episodes of syncope (work up negative), recurrent UTI's, hydronephrosis, and possible PPROM at 19 weeks that [facility] and [facility]. Antenatal testing has been done weekly since 36 weeks due to obesity. AFI's has been WNL, NST's have not always been reactive at the office but then eventually reactive with prolonged monitoring. Today AFI in office was 13 cm. Labs: O Positive, RI, VI, all other labs WNL GBS Negative    Presents with decreased fetal movement, nonreactive NST    Patient's Medical History obesity, anxiety/depression/OCD, recurrent UTI    Patient's Surgical History none    Medications Pre Natal Vitamins    Allergies PCN, Sulfa, Codeine    Social History none    Family History Non-Contributory   Exam:   Vital Signs stable    Urine Protein not completed    General no apparent distress    Mental Status clear    Chest nl effort    Abdomen gravid, non-tender    Estimated Fetal Weight Average for gestational age    Fetal Position vertex    Edema no edema    Pelvic no external lesions, 2.5 cm at office    Mebranes Intact    FHT 135, moderate variability, no decels, only occas 10x10 accels    FHT Description non reactive after 6 hours, minimal movement    Ucx irregular    Skin dry   Impression:   Impression non reactive NST at 38 weeks 5 days   Plan:   Plan EFM/NST, admit for IOL    Comments see Dr. Jean RosenthalJackson note regarding bedside BPP   Electronic Signatures: Shella Maximutnam, Yani Lal (CNM)  (Signed 22-Jan-14 18:58)  Authored: L&D Evaluation   Last Updated: 22-Jan-14 18:58 by Shella MaximPutnam, Francisca Harbuck (CNM)

## 2014-09-17 NOTE — H&P (Signed)
L&D Evaluation:  History:   HPI 24 yo G2P1, EDD of 06/09/12. presents at 30 weeks with c/o NO FM since last night. Pt states she had "tried everythin" to get the baby to move, including juice, ice water, ice cream and still felt no movement.  PNC notable for admission for hyperemesis, questionable PPROM at 18 weeks ([facility] evaluated pt, believed she was not PPROM or leak was re-sealed), BMI 43, Past hx of SVD at 37 weeks delivering a 5#1oz baby.    Presents with decreased fetal movement    Patient's Medical History Obesity, OCD, anxiety, depression, interstitial cystitis    Patient's Surgical History Hernia repair, tonsillectomy, urethral dilation    Medications Pre Natal Vitamins    Allergies PCN, Sulfa, Codeine, vicodin    Social History none    Family History Non-Contributory   ROS:   ROS see HPI   Exam:   Vital Signs stable    General no apparent distress    Mental Status clear    Abdomen gravid, obese female, ND/soft    Estimated Fetal Weight Average for gestational age    Edema no edema    Pelvic no external lesions    Mebranes Intact    FHT 150, 10x10 accels noted    Ucx absent    Skin dry   Impression:   Impression decreased fetal movement, IUP at 30 weeks   Plan:   Plan EFM/NST    Comments Reassurance given Pt started feeling some movement since being here   Electronic Signatures: Shella Maximutnam, Ronna Herskowitz (CNM)  (Signed [id number]-Nov-13 20:25)  Authored: L&D Evaluation   Last Updated: 23-Nov-13 20:25 by Shella MaximPutnam, Correy Weidner (CNM)

## 2014-09-19 ENCOUNTER — Ambulatory Visit: Payer: BLUE CROSS/BLUE SHIELD

## 2014-09-23 ENCOUNTER — Encounter: Payer: Self-pay | Admitting: Emergency Medicine

## 2014-09-23 ENCOUNTER — Ambulatory Visit
Admission: EM | Admit: 2014-09-23 | Discharge: 2014-09-23 | Disposition: A | Discharge status: Home / Self Care | Payer: BLUE CROSS/BLUE SHIELD | Attending: Registered Nurse | Admitting: Registered Nurse

## 2014-09-23 DIAGNOSIS — B029 Zoster without complications: Secondary | ICD-10-CM

## 2014-09-23 MED ORDER — VALACYCLOVIR HCL 1 G PO TABS: 1000.0000 mg | ORAL_TABLET | Freq: Three times a day (TID) | ORAL | Status: AC

## 2014-09-23 MED ORDER — PREDNISONE 20 MG PO TABS: 60.0000 mg | ORAL_TABLET | Freq: Every day | ORAL | Status: DC

## 2014-09-23 MED ORDER — VALACYCLOVIR HCL 1 G PO TABS: 1000.0000 mg | ORAL_TABLET | Freq: Three times a day (TID) | ORAL | Status: DC

## 2014-09-23 NOTE — Discharge Instructions (Signed)

## 2014-09-23 NOTE — ED Notes (Signed)
Patient c/o red itchy rash on her right side of her chest since yesterday.  Patient denies fevers.

## 2014-09-23 NOTE — ED Provider Notes (Signed)
CSN: [id number]     Arrival date & time 09/23/14  1135 History   None    Chief Complaint  Patient presents with  . Rash   (Consider location/radiation/quality/duration/timing/severity/associated sxs/prior Treatment) HPI Comments: Caucasian female noted burning sensation right breast yesterday had to remove bra at work this am rash upper back under bra strap line on shoulder blade area; still having burning sensation right breast and torso radiating to rash.  Denied history of shingles.  Did have chickenpox as child.  Daughter sick with cough and runny nose  Patient is a 24 y.o. female presenting with rash. The history is provided by the patient.  Rash Location:  Torso Torso rash location:  Upper back Quality: blistering, burning, itchiness and redness   Quality: not bruising, not draining, not dry, not painful, not peeling, not scaling, not swelling and not weeping   Severity:  Mild Onset quality:  Sudden Duration:  1 day Timing:  Constant Progression:  Worsening Chronicity:  New Context: sick contacts   Context: not animal contact, not chemical exposure, not diapers, not eggs, not exposure to similar rash, not food, not hot tub use, not insect bite/sting, not medications, not new detergent/soap, not nuts, not plant contact, not pollen, not pregnancy and not sun exposure   Relieved by:  Nothing Worsened by:  Nothing tried Ineffective treatments:  None tried Associated symptoms: no abdominal pain, no diarrhea, no fatigue, no fever, no headaches, no hoarse voice, no induration, no joint pain, no myalgias, no nausea, no periorbital edema, no shortness of breath, no sore throat, no throat swelling, no tongue swelling, no URI, not vomiting and not wheezing     History reviewed. No pertinent past medical history. Past Surgical History  Procedure Laterality Date  . Tonsillectomy    . Bladder surgery    . Hernia repair     History reviewed. No pertinent family history. History    Substance Use Topics  . Smoking status: Never Smoker   . Smokeless tobacco: Never Used  . Alcohol Use: No   OB History    No data available     Review of Systems  Constitutional: Negative for fever, chills, diaphoresis, activity change, appetite change and fatigue.  HENT: Negative for congestion, ear discharge, ear pain, facial swelling, hearing loss, hoarse voice, mouth sores, nosebleeds, postnasal drip, rhinorrhea, sinus pressure, sneezing, sore throat, tinnitus, trouble swallowing and voice change.   Respiratory: Negative for shortness of breath and wheezing.   Gastrointestinal: Negative for nausea, vomiting, abdominal pain, diarrhea, constipation and blood in stool.  Genitourinary: Negative for dysuria and difficulty urinating.  Musculoskeletal: Negative for myalgias, back pain, joint swelling, arthralgias, gait problem, neck pain and neck stiffness.  Skin: Positive for rash. Negative for color change, pallor and wound.  Allergic/Immunologic: Negative for environmental allergies and food allergies.  Neurological: Negative for tremors, seizures, syncope, speech difficulty, weakness, light-headedness, numbness and headaches.  Hematological: Negative for adenopathy. Does not bruise/bleed easily.  Psychiatric/Behavioral: Negative for behavioral problems, sleep disturbance and agitation.    Allergies  Pineapple; Sulfa antibiotics; Latex; Vicodin; Codeine; and Penicillins  Home Medications   Prior to Admission medications   Medication Sig Start Date End Date Taking? Authorizing Provider  cholecalciferol (VITAMIN D) 400 UNITS TABS tablet Take 5,000 Units by mouth daily.   Yes Historical Provider, MD  gabapentin (NEURONTIN) 300 MG capsule Take 300 mg by mouth daily.   Yes Historical Provider, MD  Multiple Vitamin (MULTIVITAMIN) tablet Take 1 tablet by mouth daily.  Yes Historical Provider, MD  predniSONE (DELTASONE) 20 MG tablet Take 3 tablets (60 mg total) by mouth daily. 09/23/14    Barbaraann Barthelina A Betancourt, NP  valACYclovir (VALTREX) 1000 MG tablet Take 1 tablet (1,000 mg total) by mouth 3 (three) times daily. 09/23/14 10/07/14  Barbaraann Barthelina A Betancourt, NP   BP 111/77 mmHg  Pulse 68  Temp(Src) 98.5 F (36.9 C) (Tympanic)  Resp 18  Ht 5\' 4"  (1.626 m)  Wt 260 lb (117.935 kg)  BMI 44.61 kg/m2  SpO2 97%  LMP 09/02/2014 (Exact Date) Physical Exam  Constitutional: She is oriented to person, place, and time. Vital signs are normal. She appears well-developed and well-nourished.  HENT:  Head: Normocephalic and atraumatic.  Right Ear: External ear normal.  Left Ear: External ear normal.  Mouth/Throat: Oropharynx is clear and moist. No oropharyngeal exudate.  Eyes: Conjunctivae, EOM and lids are normal. Pupils are equal, round, and reactive to light. Right eye exhibits no discharge. Left eye exhibits no discharge. No scleral icterus.  Neck: Trachea normal and normal range of motion. Neck supple. No tracheal deviation present.  Cardiovascular: Normal rate, regular rhythm and intact distal pulses.   Pulmonary/Chest: Effort normal and breath sounds normal. No respiratory distress. She has no wheezes. She exhibits no tenderness.  Abdominal: Soft. She exhibits no distension.  Musculoskeletal: Normal range of motion. She exhibits no edema or tenderness.  Lymphadenopathy:    She has no cervical adenopathy.  Neurological: She is alert and oriented to person, place, and time.  Skin: Skin is warm, dry and intact. Lesion and rash noted. No abrasion, no bruising, no burn, no ecchymosis, no laceration, no petechiae and no purpura noted. Rash is macular and vesicular. Rash is not papular, not maculopapular, not nodular, not pustular and not urticarial. She is not diaphoretic. There is erythema. No pallor.     Psychiatric: She has a normal mood and affect. Her speech is normal and behavior is normal. Judgment and thought content normal. Cognition and memory are normal.  Nursing note and vitals  reviewed.   ED Course  Procedures (including critical care time) Labs Review Labs Reviewed - No data to display  Imaging Review No results found.  Work excuse x 24 hours.  Discussed with patient that typically stress/illness caused immunosuppression and reactivation of herpes zoster virus that is the same as chickenpox.  typically will have discomfort and then rash appears on affected dermatome usually unilateral.  Discussed with patient typically not contagious to others unless exposed to discharge from rash to keep covered when out in public.  Do not scratch lesions as secondary infection may develop (skin infection).  It is best to start antiviral therapy at first sign of infection/outbreak.  Valacyclovir 1000mg  po TID x 7 days. Follow up with PCM if no improvement pain, fever, spreading erythema or purulent discharge for re-evaluation.  Patient verbalized understanding of information/instructions, agreed with plan of care and had no further questions at this time.  MDM   1. Shingles outbreak        Barbaraann Barthelina A Betancourt, NP 09/23/14 [phone]

## 2014-09-24 ENCOUNTER — Ambulatory Visit
Admission: RE | Admit: 2014-09-24 | Discharge: 2014-09-24 | Disposition: A | Discharge status: Home / Self Care | Payer: BLUE CROSS/BLUE SHIELD | Source: Ambulatory Visit | Attending: Urology | Admitting: Urology

## 2014-09-24 ENCOUNTER — Ambulatory Visit: Payer: BLUE CROSS/BLUE SHIELD

## 2015-01-23 ENCOUNTER — Other Ambulatory Visit: Payer: Self-pay

## 2015-01-23 ENCOUNTER — Encounter: Payer: Self-pay | Admitting: Emergency Medicine

## 2015-01-23 ENCOUNTER — Emergency Department
Admission: EM | Admit: 2015-01-23 | Discharge: 2015-01-23 | Disposition: A | Discharge status: Home / Self Care | Payer: BLUE CROSS/BLUE SHIELD | Attending: Emergency Medicine | Admitting: Emergency Medicine

## 2015-01-23 DIAGNOSIS — Z87891 Personal history of nicotine dependence: Secondary | ICD-10-CM | POA: Insufficient documentation

## 2015-01-23 DIAGNOSIS — Z79899 Other long term (current) drug therapy: Secondary | ICD-10-CM | POA: Insufficient documentation

## 2015-01-23 DIAGNOSIS — Z91040 Latex allergy status: Secondary | ICD-10-CM | POA: Insufficient documentation

## 2015-01-23 DIAGNOSIS — Z7952 Long term (current) use of systemic steroids: Secondary | ICD-10-CM | POA: Insufficient documentation

## 2015-01-23 DIAGNOSIS — Z880 Allergy status to penicillin: Secondary | ICD-10-CM | POA: Insufficient documentation

## 2015-01-23 DIAGNOSIS — M62838 Other muscle spasm: Secondary | ICD-10-CM

## 2015-01-23 DIAGNOSIS — R0789 Other chest pain: Secondary | ICD-10-CM | POA: Insufficient documentation

## 2015-01-23 LAB — TROPONIN I: Troponin I: <0.03 ng/mL (ref <0.031)

## 2015-01-23 LAB — CBC
HCT: 40.6 % (ref 35.0–47.0)
HEMOGLOBIN: 13.4 g/dL (ref 12.0–16.0)
MCH: 28.7 pg (ref 26.0–34.0)
MCHC: 32.9 g/dL (ref 32.0–36.0)
MCV: 87.2 fL (ref 80.0–100.0)
Platelets: 179 10*3/uL (ref 150–440)
RBC: 4.66 MIL/uL (ref 3.80–5.20)
RDW: 13.8 % (ref 11.5–14.5)
WBC: 6.9 10*3/uL (ref 3.6–11.0)

## 2015-01-23 LAB — COMPREHENSIVE METABOLIC PANEL
ALT: 18 U/L (ref 14–54)
ANION GAP: 8 (ref 5–15)
AST: 22 U/L (ref 15–41)
Albumin: 4 g/dL (ref 3.5–5.0)
Alkaline Phosphatase: 55 U/L (ref 38–126)
BUN: 16 mg/dL (ref 6–20)
CO2: 22 mmol/L (ref 22–32)
Calcium: 9.3 mg/dL (ref 8.9–10.3)
Chloride: 110 mmol/L (ref 101–111)
Creatinine, Ser: 0.99 mg/dL (ref 0.44–1.00)
GFR calc Af Amer: >60 mL/min (ref >60)
Glucose, Bld: 95 mg/dL (ref 65–99)
Potassium: 4.4 mmol/L (ref 3.5–5.1)
Sodium: 140 mmol/L (ref 135–145)
TOTAL PROTEIN: 6.9 g/dL (ref 6.5–8.1)
Total Bilirubin: 0.4 mg/dL (ref 0.3–1.2)

## 2015-01-23 MED ORDER — NAPROXEN 500 MG PO TABS: 500.0000 mg | ORAL_TABLET | Freq: Two times a day (BID) | ORAL | Status: DC

## 2015-01-23 MED ORDER — DIAZEPAM 5 MG PO TABS: 5.0000 mg | ORAL_TABLET | Freq: Three times a day (TID) | ORAL | Status: DC | PRN

## 2015-01-23 NOTE — ED Provider Notes (Signed)
[facility] Emergency Department Provider Note  ____________________________________________  Time seen: 8:30 PM  I have reviewed the triage vital signs and the nursing notes.   HISTORY  Chief Complaint Chest Pain    HPI Sara Boyd is a 24 y.o. female who complains of chest pain off and on for the past week. The most recent episode started yesterday evening and has been constant since then. It is in the left chest "around the breast", feeling sharp and sometimes achy, not exertional, not pleuritic. Not associated with shortness of breath vomiting diaphoresis. She describes it as radiating but reports that the radiating pain starts in the left hand travels up the left arm and goes to her left shoulder and left neck and jaw. Denies any back pain. She feels like it started recently when she was driving in her car. Denies any injuries.  She is followed up with Dr. Darrold Junker of cardiology in the past and been through multiple stress tests, Actos, and a nuclear medicine scan which were all unremarkable. She does feel very anxious due to her historyas well as being very busy recently planning for her upcoming wedding in 4 weeks.Marland Kitchen     History reviewed. No pertinent past medical history. Denies any medical history  There are no active problems to display for this patient.    Past Surgical History  Procedure Laterality Date  . Tonsillectomy    . Bladder surgery    . Hernia repair       Current Outpatient Rx  Name  Route  Sig  Dispense  Refill  . cholecalciferol (VITAMIN D) 400 UNITS TABS tablet   Oral   Take 5,000 Units by mouth daily.         . diazepam (VALIUM) 5 MG tablet   Oral   Take 1 tablet (5 mg total) by mouth every 8 (eight) hours as needed for muscle spasms.   8 tablet   0   . gabapentin (NEURONTIN) 300 MG capsule   Oral   Take 300 mg by mouth daily.         . Multiple Vitamin (MULTIVITAMIN) tablet   Oral   Take 1  tablet by mouth daily.         . naproxen (NAPROSYN) 500 MG tablet   Oral   Take 1 tablet (500 mg total) by mouth 2 (two) times daily with a meal.   20 tablet   0   . predniSONE (DELTASONE) 20 MG tablet   Oral   Take 3 tablets (60 mg total) by mouth daily.   35 tablet   0      x 7 days;  x7 days and  x 6 days      Allergies Pineapple; Sulfa antibiotics; Latex; Vicodin; Codeine; and Penicillins   No family history on file. Father suffered multiple heart attacks Social History Social History  Substance Use Topics  . Smoking status: Former Games developer  . Smokeless tobacco: Never Used  . Alcohol Use: No    Review of Systems  Constitutional:   No fever or chills. No weight changes Eyes:   No blurry vision or double vision.  ENT:   No sore throat. Cardiovascular:   Positive as abovechest pain. Respiratory:   No dyspnea or cough. Gastrointestinal:   Negative for abdominal pain, vomiting and diarrhea.  No BRBPR or melena. Genitourinary:   Negative for dysuria, urinary retention, bloody urine, or difficulty urinating. Musculoskeletal:   Negative for back pain. No joint  swelling or pain. Skin:   Negative for rash. Neurological:   Negative for headaches, focal weakness or numbness. Psychiatric:  No anxiety or depression.   Endocrine:  No hot/cold intolerance, changes in energy, or sleep difficulty.  10-point ROS otherwise negative.  ____________________________________________   PHYSICAL EXAM:  VITAL SIGNS: ED Triage Vitals  Enc Vitals Group     BP 01/23/15 1731 132/85 mmHg     Pulse Rate 01/23/15 1731 76     Resp 01/23/15 1731 18     Temp 01/23/15 1731 98.2 F (36.8 C)     Temp Source 01/23/15 1731 Oral     SpO2 01/23/15 1731 99 %     Weight 01/23/15 1731 260 lb (117.935 kg)     Height 01/23/15 1731 5\' 4"  (1.626 m)     Head Cir --      Peak Flow --      Pain Score 01/23/15 1732 8     Pain Loc --      Pain Edu? --      Excl. in GC? --       Constitutional:   Alert and oriented. Well appearing and in no distress. Eyes:   No scleral icterus. No conjunctival pallor. PERRL. EOMI ENT   Head:   Normocephalic and atraumatic.   Nose:   No congestion/rhinnorhea. No septal hematoma   Mouth/Throat:   MMM, no pharyngeal erythema. No peritonsillar mass. No uvula shift.   Neck:   No stridor. No SubQ emphysema. No meningismus. Hematological/Lymphatic/Immunilogical:   No cervical lymphadenopathy. Cardiovascular:   RRR. Normal and symmetric distal pulses are present in all extremities. No murmurs, rubs, or gallops. Respiratory:   Normal respiratory effort without tachypnea nor retractions. Breath sounds are clear and equal bilaterally. No wheezes/rales/rhonchi. Gastrointestinal:   Soft and nontender. No distention. There is no CVA tenderness.  No rebound, rigidity, or guarding. Genitourinary:   deferred Musculoskeletal:   Nontender with normal range of motion in all extremities. No joint effusions.  No lower extremity tenderness.  No edema.  The patient does report some tenderness and tenseness in the musculature of the left neck in the area of the trapezius which seems to be related to her pain sensation. Neurologic:   Normal speech and language.  CN 2-10 normal. Motor grossly intact. No pronator drift.  Normal gait. No gross focal neurologic deficits are appreciated.  Skin:    Skin is warm, dry and intact. No rash noted.  No petechiae, purpura, or bullae. Psychiatric:   Mood and affect are normal. Speech and behavior are normal. Patient exhibits appropriate insight and judgment.  ____________________________________________    LABS (pertinent positives/negatives) (all labs ordered are listed, but only abnormal results are displayed) Labs Reviewed  CBC  COMPREHENSIVE METABOLIC PANEL  TROPONIN I   ____________________________________________   EKG  Interpreted by me Normal sinus rhythm rate of 71, normal axis,  normal intervals, poor R-wave progression in anterior precordial leads. Normal ST segments and T waves.  ____________________________________________    RADIOLOGY    ____________________________________________   PROCEDURES   ____________________________________________   INITIAL IMPRESSION / ASSESSMENT AND PLAN / ED COURSE  Pertinent labs & imaging results that were available during my care of the patient were reviewed by me and considered in my medical decision making (see chart for details).  Labs unremarkable. Atypical chest pain not consistent with ACS PE TAD pneumothorax pneumonia or sepsis.she does have a history of depression and OCD and erosive esophagitis. I do think that there  is an anxiety and psychiatric component to all of her symptoms, but the nature of the chest pain coupled with her young age and lack of risk factors for coronary artery disease as confirmed by the cardiology note from March makes her very low risk chest pain suitable for outpatient follow-up with Dr. Darrold Junker. Initial troponin was checked more than 4 hours after onset of symptoms, and in fact almost 24 hours, so it is perfectly reliable regarding assessing any troponin anemia.this subacute pain is also very unlikely to be ACS related.     ____________________________________________   FINAL CLINICAL IMPRESSION(S) / ED DIAGNOSES  Final diagnoses:  Atypical chest pain  Spasm of cervical paraspinous muscle      Sharman Cheek, MD 01/23/15 2106

## 2015-01-23 NOTE — ED Notes (Signed)
Pt to ED with c/o left sided cp that radiates to left arm and left side of neck and jaw, states pain started in arm and neck 1 week ago and the cp started yesterday, states she has been seen by cardiologist in the past and told she had a blockage

## 2015-01-23 NOTE — Discharge Instructions (Signed)
You were prescribed a medication that is potentially sedating. Do not drink alcohol, drive or participate in any other potentially dangerous activities while taking this medication as it may make you sleepy. Do not take this medication with any other sedating medications, either prescription or over-the-counter. If you were prescribed Percocet or Vicodin, do not take these with acetaminophen (Tylenol) as it is already contained within these medications.   Opioid pain medications (or "narcotics") can be habit forming.  Use it as little as possible to achieve adequate pain control.  Do not use or use it with extreme caution if you have a history of opiate abuse or dependence.  If you are on a pain contract with your primary care doctor or a pain specialist, be sure to let them know you were prescribed this medication today from the [facility] Emergency Department.  This medication is intended for your use only - do not give any to anyone else and keep it in a secure place where nobody else, especially children and pets, have access to it.  It will also cause or worsen constipation, so you may want to consider taking an over-the-counter stool softener while you are taking this medication.   Chest Pain (Nonspecific) It is often hard to give a specific diagnosis for the cause of chest pain. There is always a chance that your pain could be related to something serious, such as a heart attack or a blood clot in the lungs. You need to follow up with your health care provider for further evaluation. CAUSES   Heartburn.  Pneumonia or bronchitis.  Anxiety or stress.  Inflammation around your heart (pericarditis) or lung (pleuritis or pleurisy).  A blood clot in the lung.  A collapsed lung (pneumothorax). It can develop suddenly on its own (spontaneous pneumothorax) or from trauma to the chest.  Shingles infection (herpes zoster virus). The chest wall is composed of bones, muscles, and cartilage.  Any of these can be the source of the pain.  The bones can be bruised by injury.  The muscles or cartilage can be strained by coughing or overwork.  The cartilage can be affected by inflammation and become sore (costochondritis). DIAGNOSIS  Lab tests or other studies may be needed to find the cause of your pain. Your health care provider may have you take a test called an ambulatory electrocardiogram (ECG). An ECG records your heartbeat patterns over a 24-hour period. You may also have other tests, such as:  Transthoracic echocardiogram (TTE). During echocardiography, sound waves are used to evaluate how blood flows through your heart.  Transesophageal echocardiogram (TEE).  Cardiac monitoring. This allows your health care provider to monitor your heart rate and rhythm in real time.  Holter monitor. This is a portable device that records your heartbeat and can help diagnose heart arrhythmias. It allows your health care provider to track your heart activity for several days, if needed.  Stress tests by exercise or by giving medicine that makes the heart beat faster. TREATMENT   Treatment depends on what may be causing your chest pain. Treatment may include:  Acid blockers for heartburn.  Anti-inflammatory medicine.  Pain medicine for inflammatory conditions.  Antibiotics if an infection is present.  You may be advised to change lifestyle habits. This includes stopping smoking and avoiding alcohol, caffeine, and chocolate.  You may be advised to keep your head raised (elevated) when sleeping. This reduces the chance of acid going backward from your stomach into your esophagus. Most of the  time, nonspecific chest pain will improve within 2-3 days with rest and mild pain medicine.  HOME CARE INSTRUCTIONS   If antibiotics were prescribed, take them as directed. Finish them even if you start to feel better.  For the next few days, avoid physical activities that bring on chest pain.  Continue physical activities as directed.  Do not use any tobacco products, including cigarettes, chewing tobacco, or electronic cigarettes.  Avoid drinking alcohol.  Only take medicine as directed by your health care provider.  Follow your health care provider's suggestions for further testing if your chest pain does not go away.  Keep any follow-up appointments you made. If you do not go to an appointment, you could develop lasting (chronic) problems with pain. If there is any problem keeping an appointment, call to reschedule. SEEK MEDICAL CARE IF:   Your chest pain does not go away, even after treatment.  You have a rash with blisters on your chest.  You have a fever. SEEK IMMEDIATE MEDICAL CARE IF:   You have increased chest pain or pain that spreads to your arm, neck, jaw, back, or abdomen.  You have shortness of breath.  You have an increasing cough, or you cough up blood.  You have severe back or abdominal pain.  You feel nauseous or vomit.  You have severe weakness.  You faint.  You have chills. This is an emergency. Do not wait to see if the pain will go away. Get medical help at once. Call your local emergency services (911 in [place] Do not drive yourself to the hospital. MAKE SURE YOU:   Understand these instructions.  Will watch your condition.  Will get help right away if you are not doing well or get worse. Document Released: 02/03/2005 Document Revised: 05/01/2013 Document Reviewed: 11/30/2007 [facility] Patient Information 2015 [place], [place]. This information is not intended to replace advice given to you by your health care provider. Make sure you discuss any questions you have with your health care provider.  Muscle Cramps and Spasms Muscle cramps and spasms occur when a muscle or muscles tighten and you have no control over this tightening (involuntary muscle contraction). They are a common problem and can develop in any muscle. The most common place  is in the calf muscles of the leg. Both muscle cramps and muscle spasms are involuntary muscle contractions, but they also have differences:   Muscle cramps are sporadic and painful. They may last a few seconds to a quarter of an hour. Muscle cramps are often more forceful and last longer than muscle spasms.  Muscle spasms may or may not be painful. They may also last just a few seconds or much longer. CAUSES  It is uncommon for cramps or spasms to be due to a serious underlying problem. In many cases, the cause of cramps or spasms is unknown. Some common causes are:   Overexertion.   Overuse from repetitive motions (doing the same thing over and over).   Remaining in a certain position for a long period of time.   Improper preparation, form, or technique while performing a sport or activity.   Dehydration.   Injury.   Side effects of some medicines.   Abnormally low levels of the salts and ions in your blood (electrolytes), especially potassium and calcium. This could happen if you are taking water pills (diuretics) or you are pregnant.  Some underlying medical problems can make it more likely to develop cramps or spasms. These include, but are  not limited to:   Diabetes.   Parkinson disease.   Hormone disorders, such as thyroid problems.   Alcohol abuse.   Diseases specific to muscles, joints, and bones.   Blood vessel disease where not enough blood is getting to the muscles.  HOME CARE INSTRUCTIONS   Stay well hydrated. Drink enough water and fluids to keep your urine clear or pale yellow.  It may be helpful to massage, stretch, and relax the affected muscle.  For tight or tense muscles, use a warm towel, heating pad, or hot shower water directed to the affected area.  If you are sore or have pain after a cramp or spasm, applying ice to the affected area may relieve discomfort.  Put ice in a plastic bag.  Place a towel between your skin and the  bag.  Leave the ice on for 15-20 minutes, 03-04 times a day.  Medicines used to treat a known cause of cramps or spasms may help reduce their frequency or severity. Only take over-the-counter or prescription medicines as directed by your caregiver. SEEK MEDICAL CARE IF:  Your cramps or spasms get more severe, more frequent, or do not improve over time.  MAKE SURE YOU:   Understand these instructions.  Will watch your condition.  Will get help right away if you are not doing well or get worse. Document Released: 10/16/2001 Document Revised: 08/21/2012 Document Reviewed: 04/12/2012 [facility] Patient Information 2015 [place], [place]. This information is not intended to replace advice given to you by your health care provider. Make sure you discuss any questions you have with your health care provider.

## 2015-03-13 ENCOUNTER — Emergency Department
Admission: EM | Admit: 2015-03-13 | Discharge: 2015-03-13 | Disposition: A | Discharge status: Home / Self Care | Payer: Self-pay | Attending: Emergency Medicine | Admitting: Emergency Medicine

## 2015-03-13 ENCOUNTER — Encounter: Payer: Self-pay | Admitting: Emergency Medicine

## 2015-03-13 DIAGNOSIS — Z3202 Encounter for pregnancy test, result negative: Secondary | ICD-10-CM | POA: Insufficient documentation

## 2015-03-13 DIAGNOSIS — Z91040 Latex allergy status: Secondary | ICD-10-CM | POA: Insufficient documentation

## 2015-03-13 DIAGNOSIS — Z880 Allergy status to penicillin: Secondary | ICD-10-CM | POA: Insufficient documentation

## 2015-03-13 DIAGNOSIS — Z791 Long term (current) use of non-steroidal anti-inflammatories (NSAID): Secondary | ICD-10-CM | POA: Insufficient documentation

## 2015-03-13 DIAGNOSIS — Z87891 Personal history of nicotine dependence: Secondary | ICD-10-CM | POA: Insufficient documentation

## 2015-03-13 DIAGNOSIS — N390 Urinary tract infection, site not specified: Secondary | ICD-10-CM | POA: Insufficient documentation

## 2015-03-13 DIAGNOSIS — Z7952 Long term (current) use of systemic steroids: Secondary | ICD-10-CM | POA: Insufficient documentation

## 2015-03-13 DIAGNOSIS — Z79899 Other long term (current) drug therapy: Secondary | ICD-10-CM | POA: Insufficient documentation

## 2015-03-13 LAB — URINALYSIS COMPLETE WITH MICROSCOPIC (ARMC ONLY)
Bilirubin Urine: NEGATIVE
GLUCOSE, UA: NEGATIVE mg/dL
Hgb urine dipstick: NEGATIVE
Ketones, ur: NEGATIVE mg/dL
NITRITE: NEGATIVE
PH: 5 (ref 5.0–8.0)
PROTEIN: NEGATIVE mg/dL
Specific Gravity, Urine: 1.025 (ref 1.005–1.030)

## 2015-03-13 LAB — POCT PREGNANCY, URINE: PREG TEST UR: NEGATIVE

## 2015-03-13 MED ORDER — CIPROFLOXACIN HCL 500 MG PO TABS: 500.0000 mg | ORAL_TABLET | Freq: Two times a day (BID) | ORAL | Status: DC

## 2015-03-13 MED ORDER — PHENAZOPYRIDINE HCL 95 MG PO TABS: 95.0000 mg | ORAL_TABLET | Freq: Three times a day (TID) | ORAL | Status: DC | PRN

## 2015-03-13 NOTE — ED Notes (Signed)
Pt states painful urination starting this am, pt states her urine is also dark

## 2015-03-13 NOTE — ED Provider Notes (Signed)
[facility] Emergency Department Provider Note  ____________________________________________  Time seen: Approximately 11:13 AM  I have reviewed the triage vital signs and the nursing notes.   HISTORY  Chief Complaint No chief complaint on file.    HPI Sara Boyd is a 24 y.o. female who presents to emergency department complaining of dysuria and polyuria. That her urine is also very "dark color". She states that it almost appears like aren't issues. He said there is some burning with urination. She also reports some discomfort to right lower quadrant radiating to her lower back. That she has a history of urinary tract infections and this feels the same. She denies taking any medication prior to arrival. She states that symptoms began today and have been persistent.   History reviewed. No pertinent past medical history.  There are no active problems to display for this patient.   Past Surgical History  Procedure Laterality Date  . Tonsillectomy    . Bladder surgery    . Hernia repair      Current Outpatient Rx  Name  Route  Sig  Dispense  Refill  . cholecalciferol (VITAMIN D) 400 UNITS TABS tablet   Oral   Take 5,000 Units by mouth daily.         . ciprofloxacin (CIPRO) 500 MG tablet   Oral   Take 1 tablet (500 mg total) by mouth 2 (two) times daily.   14 tablet   0   . diazepam (VALIUM) 5 MG tablet   Oral   Take 1 tablet (5 mg total) by mouth every 8 (eight) hours as needed for muscle spasms.   8 tablet   0   . gabapentin (NEURONTIN) 300 MG capsule   Oral   Take 300 mg by mouth daily.         . Multiple Vitamin (MULTIVITAMIN) tablet   Oral   Take 1 tablet by mouth daily.         . naproxen (NAPROSYN) 500 MG tablet   Oral   Take 1 tablet (500 mg total) by mouth 2 (two) times daily with a meal.   20 tablet   0   . phenazopyridine (PYRIDIUM) 95 MG tablet   Oral   Take 1 tablet (95 mg total) by mouth 3 (three)  times daily as needed for pain.   6 tablet   0   . predniSONE (DELTASONE) 20 MG tablet   Oral   Take 3 tablets (60 mg total) by mouth daily.   35 tablet   0     60mg  x 7 days; 30mg  x7 days and 10mg  x 6 days     Allergies Pineapple; Sulfa antibiotics; Latex; Vicodin; Codeine; and Penicillins  History reviewed. No pertinent family history.  Social History Social History  Substance Use Topics  . Smoking status: Former Games developermoker  . Smokeless tobacco: Never Used  . Alcohol Use: No    Review of Systems Constitutional: No fever/chills Eyes: No visual changes. ENT: No sore throat. Cardiovascular: Denies chest pain. Respiratory: Denies shortness of breath. Gastrointestinal: No abdominal pain.  No nausea, no vomiting.  No diarrhea.  No constipation. Genitourinary: Endorses dysuria. Endorses polyuria. Endorses flank pain.  Musculoskeletal: Negative for back pain. Skin: Negative for rash. Neurological: Negative for headaches, focal weakness or numbness.  10-point ROS otherwise negative.  ____________________________________________   PHYSICAL EXAM:  VITAL SIGNS: ED Triage Vitals  Enc Vitals Group     BP --  Pulse --      Resp --      Temp --      Temp src --      SpO2 --      Weight --      Height --      Head Cir --      Peak Flow --      Pain Score --      Pain Loc --      Pain Edu? --      Excl. in GC? --     Constitutional: Alert and oriented. Well appearing and in no acute distress. Eyes: Conjunctivae are normal. PERRL. EOMI. Head: Atraumatic. Nose: No congestion/rhinnorhea. Mouth/Throat: Mucous membranes are moist.  Oropharynx non-erythematous. Neck: No stridor.   Cardiovascular: Normal rate, regular rhythm. Grossly normal heart sounds.  Good peripheral circulation. Respiratory: Normal respiratory effort.  No retractions. Lungs CTAB. Gastrointestinal: Soft and nontender. No distention. No abdominal bruits. No CVA tenderness. Musculoskeletal: No  lower extremity tenderness nor edema.  No joint effusions. Neurologic:  Normal speech and language. No gross focal neurologic deficits are appreciated. No gait instability. Skin:  Skin is warm, dry and intact. No rash noted. Psychiatric: Mood and affect are normal. Speech and behavior are normal.  ____________________________________________   LABS (all labs ordered are listed, but only abnormal results are displayed)  Labs Reviewed  URINALYSIS COMPLETEWITH MICROSCOPIC ([facility] ONLY) - Abnormal; Notable for the following:    Color, Urine YELLOW (*)    APPearance HAZY (*)    Leukocytes, UA 2+ (*)    Bacteria, UA MANY (*)    Squamous Epithelial / LPF 0-5 (*)    All other components within normal limits  POCT PREGNANCY, URINE   ____________________________________________  EKG   ____________________________________________  RADIOLOGY   ____________________________________________   PROCEDURES  Procedure(s) performed: None  Critical Care performed: No  ____________________________________________   INITIAL IMPRESSION / ASSESSMENT AND PLAN / ED COURSE  Pertinent labs & imaging results that were available during my care of the patient were reviewed by me and considered in my medical decision making (see chart for details).  Patient's history, symptoms, physical exam are taken into consideration for diagnosis. Patient's diagnosis is most consistent with urinary tract infection. I'll provide the patient with antibiotics and symptomatic medications. The patient reports that she has had many urinary tract infections and that Cipro is the only medication that provides relief. I'll prescribe Cipro for patient. The patient verbalizes understanding of the diagnosis and treatment plan. She verbalizes compliance with same. Patient is to follow with primary care should symptoms persist past treatment course. ____________________________________________   FINAL CLINICAL IMPRESSION(S) /  ED DIAGNOSES  Final diagnoses:  UTI (lower urinary tract infection)      Delorise Royals Cuthriell, PA-C 03/13/15 1224  Jene Every, MD 03/13/15 [phone]

## 2015-03-13 NOTE — Discharge Instructions (Signed)
Antibiotic Medicine °Antibiotic medicines are used to treat infections caused by bacteria. They work by injuring or killing the bacteria that is making you sick. °HOW IS AN ANTIBIOTIC CHOSEN? °An antibiotic is chosen based on many factors. To help your health care provider choose one for you, tell your health care provider if: °· You have any allergies. °· You are pregnant or plan to get pregnant. °· You are breastfeeding. °· You are taking any medicines. These include over-the-counter medicines, prescription medicines, and herbal remedies. °· You have a medical condition or problem you have not already discussed. °Your health care provider will also consider: °· How often the medicine has to be taken. °· Common side effects of the medicine. °· The cost of the medicine. °· The taste of the medicine. °If you have questions about why an antibiotic was chosen, make sure to ask. °FOR HOW LONG SHOULD I TAKE MY ANTIBIOTIC? °Continue to take your antibiotic for as long as told by your health care provider. Do not stop taking it when you feel better. If you stop taking it too soon: °· You may start to feel sick again. °· Your infection may become harder to treat. °· Complications may develop. °WHAT IF I MISS A DOSE? °Try not to miss any doses of medicine. If you miss a dose, take it as soon as possible. However, if it is almost time for the next dose: °· If you are taking 2 doses per day, take the missed dose and the next dose 5 to 6 hours apart. °· If you are taking 3 or more doses per day, take the missed dose and the next dose 2 to 4 hours apart, then go back to the normal schedule. °If you cannot make up a missed dose, take the next scheduled dose on time. Then take the missed dose after you have taken all the doses as recommended by your health care provider, as if you had one more dose left. °DO ANTIBIOTICS AFFECT BIRTH CONTROL? °Birth control pills may not work while you are on antibiotics. If you are taking birth  control pills, continue taking them as usual and use a second form of birth control, such as a condom, to avoid unwanted pregnancy. Continue using the second form of birth control until you are finished with your current 1 month cycle of birth control pills. °OTHER INFORMATION °· If there is any medicine left over, throw it away. °· Never take someone else's antibiotics. °· Never take leftover antibiotics. °SEEK MEDICAL CARE IF: °· You get worse. °· You do not feel better within a few days of starting the antibiotic medicine. °· You vomit. °· White patches appear in your mouth. °· You have new joint pain that begins after starting the antibiotic. °· You have new muscle aches that begin after starting the antibiotic. °· You had a fever before starting the antibiotic and it returns. °· You have any symptoms of an allergic reaction, such as an itchy rash. If this happens, stop taking the antibiotic. °SEEK IMMEDIATE MEDICAL CARE IF: °· Your urine turns dark or becomes blood-colored. °· Your skin turns yellow. °· You bruise or bleed easily. °· You have severe diarrhea and abdominal cramps. °· You have a severe headache. °· You have signs of a severe allergic reaction, such as: °¨ Trouble breathing. °¨ Wheezing. °¨ Swelling of the lips, tongue, or face. °¨ Fainting. °¨ Blisters on the skin or in the mouth. °If you have signs of a severe allergic   reaction, stop taking the antibiotic right away. °  °This information is not intended to replace advice given to you by your health care provider. Make sure you discuss any questions you have with your health care provider. °  °Document Released: 01/07/2004 Document Revised: 01/15/2015 Document Reviewed: 09/11/2014 °Elsevier Interactive Patient Education ©2016 [employer] ° °Urinary Tract Infection °Urinary tract infections (UTIs) can develop anywhere along your urinary tract. Your urinary tract is your body's drainage system for removing wastes and extra water. Your urinary  tract includes two kidneys, two ureters, a bladder, and a urethra. Your kidneys are a pair of bean-shaped organs. Each kidney is about the size of your fist. They are located below your ribs, one on each side of your spine. °CAUSES °Infections are caused by microbes, which are microscopic organisms, including fungi, viruses, and bacteria. These organisms are so small that they can only be seen through a microscope. Bacteria are the microbes that most commonly cause UTIs. °SYMPTOMS  °Symptoms of UTIs may vary by age and gender of the patient and by the location of the infection. Symptoms in young women typically include a frequent and intense urge to urinate and a painful, burning feeling in the bladder or urethra during urination. Older women and men are more likely to be tired, shaky, and weak and have muscle aches and abdominal pain. A fever may mean the infection is in your kidneys. Other symptoms of a kidney infection include pain in your back or sides below the ribs, nausea, and vomiting. °DIAGNOSIS °To diagnose a UTI, your caregiver will ask you about your symptoms. Your caregiver will also ask you to provide a urine sample. The urine sample will be tested for bacteria and white blood cells. White blood cells are made by your body to help fight infection. °TREATMENT  °Typically, UTIs can be treated with medication. Because most UTIs are caused by a bacterial infection, they usually can be treated with the use of antibiotics. The choice of antibiotic and length of treatment depend on your symptoms and the type of bacteria causing your infection. °HOME CARE INSTRUCTIONS °· If you were prescribed antibiotics, take them exactly as your caregiver instructs you. Finish the medication even if you feel better after you have only taken some of the medication. °· Drink enough water and fluids to keep your urine clear or pale yellow. °· Avoid caffeine, tea, and carbonated beverages. They tend to irritate your  bladder. °· Empty your bladder often. Avoid holding urine for long periods of time. °· Empty your bladder before and after sexual intercourse. °· After a bowel movement, women should cleanse from front to back. Use each tissue only once. °SEEK MEDICAL CARE IF:  °· You have back pain. °· You develop a fever. °· Your symptoms do not begin to resolve within 3 days. °SEEK IMMEDIATE MEDICAL CARE IF:  °· You have severe back pain or lower abdominal pain. °· You develop chills. °· You have nausea or vomiting. °· You have continued burning or discomfort with urination. °MAKE SURE YOU:  °· Understand these instructions. °· Will watch your condition. °· Will get help right away if you are not doing well or get worse. °  °This information is not intended to replace advice given to you by your health care provider. Make sure you discuss any questions you have with your health care provider. °  °Document Released: 02/03/2005 Document Revised: 01/15/2015 Document Reviewed: 06/04/2011 °Elsevier Interactive Patient Education ©2016 [employer] ° °

## 2015-04-21 ENCOUNTER — Telehealth: Payer: Self-pay | Admitting: Internal Medicine

## 2015-04-21 ENCOUNTER — Ambulatory Visit: Payer: BLUE CROSS/BLUE SHIELD | Admitting: Internal Medicine

## 2015-04-21 DIAGNOSIS — Z0289 Encounter for other administrative examinations: Secondary | ICD-10-CM

## 2015-04-21 NOTE — Telephone Encounter (Signed)
FYI, Pt called she mixed up her appt for today. Appt is still on schedule. Pt made another appt. Thank You!

## 2015-05-16 ENCOUNTER — Ambulatory Visit: Payer: BLUE CROSS/BLUE SHIELD | Admitting: Internal Medicine

## 2015-07-06 ENCOUNTER — Emergency Department
Admission: EM | Admit: 2015-07-06 | Discharge: 2015-07-06 | Disposition: A | Discharge status: Home / Self Care | Payer: BLUE CROSS/BLUE SHIELD | Attending: Emergency Medicine | Admitting: Emergency Medicine

## 2015-07-06 ENCOUNTER — Emergency Department: Payer: BLUE CROSS/BLUE SHIELD

## 2015-07-06 DIAGNOSIS — R079 Chest pain, unspecified: Secondary | ICD-10-CM | POA: Insufficient documentation

## 2015-07-06 DIAGNOSIS — R61 Generalized hyperhidrosis: Secondary | ICD-10-CM | POA: Insufficient documentation

## 2015-07-06 DIAGNOSIS — Z91040 Latex allergy status: Secondary | ICD-10-CM | POA: Diagnosis not present

## 2015-07-06 DIAGNOSIS — Z87891 Personal history of nicotine dependence: Secondary | ICD-10-CM | POA: Diagnosis not present

## 2015-07-06 LAB — CBC
HCT: 40.2 % (ref 35.0–47.0)
Hemoglobin: 13.8 g/dL (ref 12.0–16.0)
MCH: 28.6 pg (ref 26.0–34.0)
MCHC: 34.4 g/dL (ref 32.0–36.0)
MCV: 83.2 fL (ref 80.0–100.0)
PLATELETS: 284 10*3/uL (ref 150–440)
RBC: 4.84 MIL/uL (ref 3.80–5.20)
RDW: 13.8 % (ref 11.5–14.5)
WBC: 7.2 10*3/uL (ref 3.6–11.0)

## 2015-07-06 LAB — BASIC METABOLIC PANEL
Anion gap: 8 (ref 5–15)
BUN: 17 mg/dL (ref 6–20)
CHLORIDE: 109 mmol/L (ref 101–111)
CO2: 24 mmol/L (ref 22–32)
CREATININE: 1.01 mg/dL — AB (ref 0.44–1.00)
Calcium: 9.6 mg/dL (ref 8.9–10.3)
Glucose, Bld: 112 mg/dL — ABNORMAL HIGH (ref 65–99)
Potassium: 4.1 mmol/L (ref 3.5–5.1)
Sodium: 141 mmol/L (ref 135–145)

## 2015-07-06 LAB — TROPONIN I

## 2015-07-06 NOTE — ED Provider Notes (Signed)
[facility] Emergency Department Provider Note     Time seen: ----------------------------------------- 8:36 PM on 07/06/2015 -----------------------------------------    I have reviewed the triage vital signs and the nursing notes.   HISTORY  Chief Complaint Chest Pain    HPI Sara Boyd is a 25 y.o. female who presents to ER with left axillary pain that radiates in for back and her neck. Patient states she began to experience left-sided chest pain with shortness of breath, dizziness and nausea. Patient describes pain as sharp, nothing makes it better or worse. Patient does not think it's anxiety related. She has had these symptoms before and has had a stress test which was grossly unremarkable   No past medical history on file.  There are no active problems to display for this patient.   Past Surgical History  Procedure Laterality Date  . Tonsillectomy    . Bladder surgery    . Hernia repair      Allergies Pineapple; Sulfa antibiotics; Latex; Vicodin; Codeine; and Penicillins  Social History Social History  Substance Use Topics  . Smoking status: Former Games developer  . Smokeless tobacco: Never Used  . Alcohol Use: No    Review of Systems Constitutional: Negative for fever. Eyes: Negative for visual changes. ENT: Negative for sore throat. Cardiovascular: Positive for chest pain Respiratory: Negative for shortness of breath. Gastrointestinal: Negative for abdominal pain, vomiting and diarrhea. Genitourinary: Negative for dysuria. Musculoskeletal: Negative for back pain. Skin: Negative for rash. Positive for sweating Neurological: Negative for headaches, focal weakness or numbness.  10-point ROS otherwise negative.  ____________________________________________   PHYSICAL EXAM:  VITAL SIGNS: ED Triage Vitals  Enc Vitals Group     BP 07/06/15 2019 129/73 mmHg     Pulse Rate 07/06/15 2019 77     Resp 07/06/15 2015 16      Temp 07/06/15 2019 98.5 F (36.9 C)     Temp Source 07/06/15 2019 Oral     SpO2 07/06/15 2019 100 %     Weight 07/06/15 2015 260 lb (117.935 kg)     Height 07/06/15 2015  (1.626 m)     Head Cir --      Peak Flow --      Pain Score 07/06/15 2016 6     Pain Loc --      Pain Edu? --      Excl. in GC? --     Constitutional: Alert and oriented. Well appearing and in no distress. Eyes: Conjunctivae are normal. PERRL. Normal extraocular movements. ENT   Head: Normocephalic and atraumatic.   Nose: No congestion/rhinnorhea.   Mouth/Throat: Mucous membranes are moist.   Neck: No stridor. Cardiovascular: Normal rate, regular rhythm. Normal and symmetric distal pulses are present in all extremities. No murmurs, rubs, or gallops. Respiratory: Normal respiratory effort without tachypnea nor retractions. Breath sounds are clear and equal bilaterally. No wheezes/rales/rhonchi. Gastrointestinal: Soft and nontender. No distention. No abdominal bruits.  Musculoskeletal: Nontender with normal range of motion in all extremities. No joint effusions.  No lower extremity tenderness nor edema. Neurologic:  Normal speech and language. No gross focal neurologic deficits are appreciated. Speech is normal. No gait instability. Skin:  Skin is warm, dry and intact. No rash noted. Psychiatric: Mood and affect are normal. Speech and behavior are normal. Patient exhibits appropriate insight and judgment. ____________________________________________  EKG: Interpreted by me. Normal sinus rhythm rate of 88 bpm, normal PR interval, normal QRS, normal QT interval. Normal EKG.  ____________________________________________  ED COURSE:  Pertinent labs & imaging results that were available during my care of the patient were reviewed by me and considered in my medical decision making (see chart for details). Patient is in no acute distress, is doubtful for  ACS. ____________________________________________    LABS (pertinent positives/negatives)  Labs Reviewed  BASIC METABOLIC PANEL - Abnormal; Notable for the following:    Glucose, Bld 112 (*)    Creatinine, Ser 1.01 (*)    All other components within normal limits  CBC  TROPONIN I    RADIOLOGY  Chest x-ray Is unremarkable ____________________________________________  FINAL ASSESSMENT AND PLAN  Chest pain  Plan: Patient with labs and imaging as dictated above. Patient is low risk for ACS. I will refer her back to cardiology for follow-up. Pain is likely either musculoskeletal or anxiety related.   Emily Filbert, MD   Emily Filbert, MD 07/06/15 2123

## 2015-07-06 NOTE — ED Notes (Signed)
Pt states last night began to experience left axilla pain that radiated to her back and "my whole neck". Pt states then began to experience left sided chest pain with shob, dizziness, nausea and emesis. Pt describes pain as sharp.

## 2015-07-06 NOTE — Discharge Instructions (Signed)
Nonspecific Chest Pain  °Chest pain can be caused by many different conditions. There is always a chance that your pain could be related to something serious, such as a heart attack or a blood clot in your lungs. Chest pain can also be caused by conditions that are not life-threatening. If you have chest pain, it is very important to follow up with your health care provider. °CAUSES  °Chest pain can be caused by: °· Heartburn. °· Pneumonia or bronchitis. °· Anxiety or stress. °· Inflammation around your heart (pericarditis) or lung (pleuritis or pleurisy). °· A blood clot in your lung. °· A collapsed lung (pneumothorax). It can develop suddenly on its own (spontaneous pneumothorax) or from trauma to the chest. °· Shingles infection (varicella-zoster virus). °· Heart attack. °· Damage to the bones, muscles, and cartilage that make up your chest wall. This can include: °¨ Bruised bones due to injury. °¨ Strained muscles or cartilage due to frequent or repeated coughing or overwork. °¨ Fracture to one or more ribs. °¨ Sore cartilage due to inflammation (costochondritis). °RISK FACTORS  °Risk factors for chest pain may include: °· Activities that increase your risk for trauma or injury to your chest. °· Respiratory infections or conditions that cause frequent coughing. °· Medical conditions or overeating that can cause heartburn. °· Heart disease or family history of heart disease. °· Conditions or health behaviors that increase your risk of developing a blood clot. °· Having had chicken pox (varicella zoster). °SIGNS AND SYMPTOMS °Chest pain can feel like: °· Burning or tingling on the surface of your chest or deep in your chest. °· Crushing, pressure, aching, or squeezing pain. °· Dull or sharp pain that is worse when you move, cough, or take a deep breath. °· Pain that is also felt in your back, neck, shoulder, or arm, or pain that spreads to any of these areas. °Your chest pain may come and go, or it may stay  constant. °DIAGNOSIS °Lab tests or other studies may be needed to find the cause of your pain. Your health care provider may have you take a test called an ambulatory ECG (electrocardiogram). An ECG records your heartbeat patterns at the time the test is performed. You may also have other tests, such as: °· Transthoracic echocardiogram (TTE). During echocardiography, sound waves are used to create a picture of all of the heart structures and to look at how blood flows through your heart. °· Transesophageal echocardiogram (TEE). This is a more advanced imaging test that obtains images from inside your body. It allows your health care provider to see your heart in finer detail. °· Cardiac monitoring. This allows your health care provider to monitor your heart rate and rhythm in real time. °· Holter monitor. This is a portable device that records your heartbeat and can help to diagnose abnormal heartbeats. It allows your health care provider to track your heart activity for several days, if needed. °· Stress tests. These can be done through exercise or by taking medicine that makes your heart beat more quickly. °· Blood tests. °· Imaging tests. °TREATMENT  °Your treatment depends on what is causing your chest pain. Treatment may include: °· Medicines. These may include: °¨ Acid blockers for heartburn. °¨ Anti-inflammatory medicine. °¨ Pain medicine for inflammatory conditions. °¨ Antibiotic medicine, if an infection is present. °¨ Medicines to dissolve blood clots. °¨ Medicines to treat coronary artery disease. °· Supportive care for conditions that do not require medicines. This may include: °¨ Resting. °¨ Applying heat   or cold packs to injured areas. °¨ Limiting activities until pain decreases. °HOME CARE INSTRUCTIONS °· If you were prescribed an antibiotic medicine, finish it all even if you start to feel better. °· Avoid any activities that bring on chest pain. °· Do not use any tobacco products, including  cigarettes, chewing tobacco, or electronic cigarettes. If you need help quitting, ask your health care provider. °· Do not drink alcohol. °· Take medicines only as directed by your health care provider. °· Keep all follow-up visits as directed by your health care provider. This is important. This includes any further testing if your chest pain does not go away. °· If heartburn is the cause for your chest pain, you may be told to keep your head raised (elevated) while sleeping. This reduces the chance that acid will go from your stomach into your esophagus. °· Make lifestyle changes as directed by your health care provider. These may include: °¨ Getting regular exercise. Ask your health care provider to suggest some activities that are safe for you. °¨ Eating a heart-healthy diet. A registered dietitian can help you to learn healthy eating options. °¨ Maintaining a healthy weight. °¨ Managing diabetes, if necessary. °¨ Reducing stress. °SEEK MEDICAL CARE IF: °· Your chest pain does not go away after treatment. °· You have a rash with blisters on your chest. °· You have a fever. °SEEK IMMEDIATE MEDICAL CARE IF:  °· Your chest pain is worse. °· You have an increasing cough, or you cough up blood. °· You have severe abdominal pain. °· You have severe weakness. °· You faint. °· You have chills. °· You have sudden, unexplained chest discomfort. °· You have sudden, unexplained discomfort in your arms, back, neck, or jaw. °· You have shortness of breath at any time. °· You suddenly start to sweat, or your skin gets clammy. °· You feel nauseous or you vomit. °· You suddenly feel light-headed or dizzy. °· Your heart begins to beat quickly, or it feels like it is skipping beats. °These symptoms may represent a serious problem that is an emergency. Do not wait to see if the symptoms will go away. Get medical help right away. Call your local emergency services (911 in the [place] Do not drive yourself to the hospital. °  °This  information is not intended to replace advice given to you by your health care provider. Make sure you discuss any questions you have with your health care provider. °  °Document Released: 02/03/2005 Document Revised: 05/17/2014 Document Reviewed: 11/30/2013 °Elsevier Interactive Patient Education ©2016 Elsevier Inc. ° °

## 2015-11-26 ENCOUNTER — Encounter: Payer: Self-pay | Admitting: Emergency Medicine

## 2015-11-26 ENCOUNTER — Emergency Department
Admission: EM | Admit: 2015-11-26 | Discharge: 2015-11-26 | Disposition: A | Discharge status: Home / Self Care | Payer: Managed Care, Other (non HMO) | Attending: Emergency Medicine | Admitting: Emergency Medicine

## 2015-11-26 ENCOUNTER — Emergency Department: Payer: Managed Care, Other (non HMO)

## 2015-11-26 DIAGNOSIS — R1031 Right lower quadrant pain: Secondary | ICD-10-CM | POA: Diagnosis not present

## 2015-11-26 DIAGNOSIS — Z79899 Other long term (current) drug therapy: Secondary | ICD-10-CM | POA: Insufficient documentation

## 2015-11-26 DIAGNOSIS — N898 Other specified noninflammatory disorders of vagina: Secondary | ICD-10-CM | POA: Diagnosis not present

## 2015-11-26 DIAGNOSIS — Z87891 Personal history of nicotine dependence: Secondary | ICD-10-CM | POA: Diagnosis not present

## 2015-11-26 LAB — WET PREP, GENITAL
CLUE CELLS WET PREP: NONE SEEN
SPERM: NONE SEEN
Trich, Wet Prep: NONE SEEN
Yeast Wet Prep HPF POC: NONE SEEN

## 2015-11-26 LAB — CBC
HCT: 42.5 % (ref 35.0–47.0)
HEMOGLOBIN: 14.5 g/dL (ref 12.0–16.0)
MCH: 28.8 pg (ref 26.0–34.0)
MCHC: 34.1 g/dL (ref 32.0–36.0)
MCV: 84.4 fL (ref 80.0–100.0)
Platelets: 249 10*3/uL (ref 150–440)
RBC: 5.03 MIL/uL (ref 3.80–5.20)
RDW: 14.3 % (ref 11.5–14.5)
WBC: 7.4 10*3/uL (ref 3.6–11.0)

## 2015-11-26 LAB — BASIC METABOLIC PANEL
ANION GAP: 8 (ref 5–15)
BUN: 11 mg/dL (ref 6–20)
CALCIUM: 9.1 mg/dL (ref 8.9–10.3)
CO2: 23 mmol/L (ref 22–32)
Chloride: 107 mmol/L (ref 101–111)
Creatinine, Ser: 0.82 mg/dL (ref 0.44–1.00)
Glucose, Bld: 136 mg/dL — ABNORMAL HIGH (ref 65–99)
Potassium: 3.5 mmol/L (ref 3.5–5.1)
Sodium: 138 mmol/L (ref 135–145)

## 2015-11-26 LAB — URINALYSIS COMPLETE WITH MICROSCOPIC (ARMC ONLY)
BILIRUBIN URINE: NEGATIVE
GLUCOSE, UA: NEGATIVE mg/dL
Hgb urine dipstick: NEGATIVE
Ketones, ur: NEGATIVE mg/dL
NITRITE: NEGATIVE
PH: 5 (ref 5.0–8.0)
Protein, ur: NEGATIVE mg/dL
Specific Gravity, Urine: 1.021 (ref 1.005–1.030)

## 2015-11-26 LAB — CHLAMYDIA/NGC RT PCR (ARMC ONLY)
Chlamydia Tr: NOT DETECTED
N GONORRHOEAE: NOT DETECTED

## 2015-11-26 LAB — POCT PREGNANCY, URINE: PREG TEST UR: NEGATIVE

## 2015-11-26 MED ORDER — NAPROXEN 500 MG PO TABS: 500.0000 mg | ORAL_TABLET | Freq: Two times a day (BID) | ORAL | Status: DC

## 2015-11-26 NOTE — ED Notes (Signed)
Pt presents to  EDfor complaints of lower abdominal pain/pelvic pain and back pain  to right side with notable large clear  Discharge sweet smell. Pt reports that she is late on her period by 3 week lmp 5/26 but has not had a positive urine pregnancy test. OB MD like 2 weeks ago got pap smear and week late and some pain. OB ordered birth control pt was to start after next period hasnt had one and today pain got worse. 10/10 pt took 800mg  ibuprofren 1.5hr ago helped pain to 6/10.

## 2015-11-26 NOTE — Discharge Instructions (Signed)
Some of your tests today will take until tomorrow to return final results.  You will receive a call from the hospital if there are any concerning results that need to be addressed.  Abdominal Pain, Adult Many things can cause abdominal pain. Usually, abdominal pain is not caused by a disease and will improve without treatment. It can often be observed and treated at home. Your health care provider will do a physical exam and possibly order blood tests and X-rays to help determine the seriousness of your pain. However, in many cases, more time must pass before a clear cause of the pain can be found. Before that point, your health care provider may not know if you need more testing or further treatment. HOME CARE INSTRUCTIONS Monitor your abdominal pain for any changes. The following actions may help to alleviate any discomfort you are experiencing:  Only take over-the-counter or prescription medicines as directed by your health care provider.  Do not take laxatives unless directed to do so by your health care provider.  Try a clear liquid diet (broth, tea, or water) as directed by your health care provider. Slowly move to a bland diet as tolerated. SEEK MEDICAL CARE IF:  You have unexplained abdominal pain.  You have abdominal pain associated with nausea or diarrhea.  You have pain when you urinate or have a bowel movement.  You experience abdominal pain that wakes you in the night.  You have abdominal pain that is worsened or improved by eating food.  You have abdominal pain that is worsened with eating fatty foods.  You have a fever. SEEK IMMEDIATE MEDICAL CARE IF:  Your pain does not go away within 2 hours.  You keep throwing up (vomiting).  Your pain is felt only in portions of the abdomen, such as the right side or the left lower portion of the abdomen.  You pass bloody or black tarry stools. MAKE SURE YOU:  Understand these instructions.  Will watch your  condition.  Will get help right away if you are not doing well or get worse.   This information is not intended to replace advice given to you by your health care provider. Make sure you discuss any questions you have with your health care provider.   Document Released: 02/03/2005 Document Revised: 01/15/2015 Document Reviewed: 01/03/2013 Elsevier Interactive Patient Education [employer]2016 Elsevier Inc.

## 2015-11-26 NOTE — ED Notes (Signed)
Pt arrived to the ED accompanied by family for complaints of lower abdominal pain/pelvic pain and back pain with notable watery discharge. Pt reports that she is late on her period but has not had a positive urine pregnancy test. Pt is AOx4 in no apparent distress.

## 2015-11-26 NOTE — ED Provider Notes (Signed)
[facility] Emergency Department Provider Note  ____________________________________________  Time seen: 8:30 PM  I have reviewed the triage vital signs and the nursing notes.   HISTORY  Chief Complaint Pelvic Pain and Flank Pain    HPI Nicolemarie Wooley is a 25 y.o. female who complains of pelvic pain and right lower quadrant abdominal pain has been worsening over the last 2 days. Gradual onset, waxing and waning. Currently was 10 out of 10 earlier today, took ibuprofen which diminished at 26 out of 10. No other alleviating factors. No aggravating factors. Associated with a clear mucoid vaginal discharge that the patient has noticed over the past few weeks. She also reports that she is 3 weeks late for her period, LMP 10/03/2015, normally they are very regular. She does take birth control pills.  No nausea vomiting. No fevers or chills. No dysuria frequency urgency. No chest pain shortness breath dizziness or syncope. Her only sexual partner is her husband.    History reviewed. No pertinent past medical history.   There are no active problems to display for this patient.    Past Surgical History  Procedure Laterality Date  . Tonsillectomy    . Bladder surgery    . Hernia repair       Current Outpatient Rx  Name  Route  Sig  Dispense  Refill  . cholecalciferol (VITAMIN D) 400 UNITS TABS tablet   Oral   Take 5,000 Units by mouth daily.         . ciprofloxacin (CIPRO) 500 MG tablet   Oral   Take 1 tablet (500 mg total) by mouth 2 (two) times daily.   14 tablet   0   . diazepam (VALIUM) 5 MG tablet   Oral   Take 1 tablet (5 mg total) by mouth every 8 (eight) hours as needed for muscle spasms.   8 tablet   0   . gabapentin (NEURONTIN) 300 MG capsule   Oral   Take 300 mg by mouth daily.         . Multiple Vitamin (MULTIVITAMIN) tablet   Oral   Take 1 tablet by mouth daily.         . naproxen (NAPROSYN) 500 MG tablet  Oral   Take 1 tablet (500 mg total) by mouth 2 (two) times daily with a meal.   20 tablet   0   . phenazopyridine (PYRIDIUM) 95 MG tablet   Oral   Take 1 tablet (95 mg total) by mouth 3 (three) times daily as needed for pain.   6 tablet   0   . predniSONE (DELTASONE) 20 MG tablet   Oral   Take 3 tablets (60 mg total) by mouth daily.   35 tablet   0     60mg  x 7 days; 30mg  x7 days and 10mg  x 6 days      Allergies Pineapple; Sulfa antibiotics; Latex; Vicodin; Codeine; and Penicillins   No family history on file.  Social History Social History  Substance Use Topics  . Smoking status: Former Games developer  . Smokeless tobacco: Never Used  . Alcohol Use: No    Review of Systems  Constitutional:   No fever or chills.  ENT:   No sore throat. No rhinorrhea. Cardiovascular:   No chest pain. Respiratory:   No dyspnea or cough. Gastrointestinal:   Right lower quadrant abdominal pain. No diarrhea or constipation. No vomiting..  Genitourinary:   Negative for dysuria or difficulty urinating. Positive  vaginal discharge Musculoskeletal:   Negative for focal pain or swelling Neurological:   Negative for headaches 10-point ROS otherwise negative.  ____________________________________________   PHYSICAL EXAM:  VITAL SIGNS: ED Triage Vitals  Enc Vitals Group     BP 11/26/15 1918 131/91 mmHg     Pulse Rate 11/26/15 1918 92     Resp 11/26/15 1918 20     Temp 11/26/15 1918 98.2 F (36.8 C)     Temp Source 11/26/15 1918 Oral     SpO2 11/26/15 1918 99 %     Weight 11/26/15 1918 276 lb (125.193 kg)     Height 11/26/15 1918 5\' 4"  (1.626 m)     Head Cir --      Peak Flow --      Pain Score 11/26/15 1920 6     Pain Loc --      Pain Edu? --      Excl. in GC? --     Vital signs reviewed, nursing assessments reviewed.   Constitutional:   Alert and oriented. Well appearing and in no distress. Eyes:   No scleral icterus. No conjunctival pallor. PERRL. EOMI.  No nystagmus. ENT    Head:   Normocephalic and atraumatic.   Nose:   No congestion/rhinnorhea. No septal hematoma   Mouth/Throat:   MMM, no pharyngeal erythema. No peritonsillar mass.    Neck:   No stridor. No SubQ emphysema. No meningismus. Hematological/Lymphatic/Immunilogical:   No cervical lymphadenopathy. Cardiovascular:   RRR. Symmetric bilateral radial and DP pulses.  No murmurs.  Respiratory:   Normal respiratory effort without tachypnea nor retractions. Breath sounds are clear and equal bilaterally. No wheezes/rales/rhonchi. Gastrointestinal:   Soft With right lower quadrant tenderness. Non distended. There is no CVA tenderness.  No rebound, rigidity, or guarding. Genitourinary:   Pelvic exam performed with nurse Cline CrockKemery. External exam unremarkable, no lesions. Speculum exam reveals mucous discharge in the vaginal vault. No visual evidence of cervicitis. No lesions. On bimanual exam, there is no cervical motion tenderness. No adnexal masses but there is tenderness in the right adnexa on bimanual exam. Uterus is normal size and nontender. Musculoskeletal:   Nontender with normal range of motion in all extremities. No joint effusions.  No lower extremity tenderness.  No edema. Neurologic:   Normal speech and language.  CN 2-10 normal. Motor grossly intact. No gross focal neurologic deficits are appreciated.  Skin:    Skin is warm, dry and intact. No rash noted.  No petechiae, purpura, or bullae.  ____________________________________________    LABS (pertinent positives/negatives) (all labs ordered are listed, but only abnormal results are displayed) Labs Reviewed  WET PREP, GENITAL - Abnormal; Notable for the following:    WBC, Wet Prep HPF POC MODERATE (*)    All other components within normal limits  URINALYSIS COMPLETEWITH MICROSCOPIC ([facility] ONLY) - Abnormal; Notable for the following:    Color, Urine YELLOW (*)    APPearance HAZY (*)    Leukocytes, UA TRACE (*)    Bacteria, UA MANY (*)     Squamous Epithelial / LPF 0-5 (*)    All other components within normal limits  BASIC METABOLIC PANEL - Abnormal; Notable for the following:    Glucose, Bld 136 (*)    All other components within normal limits  CHLAMYDIA/NGC RT PCR ([facility] ONLY)  URINE CULTURE  CBC  POC URINE PREG, ED  POCT PREGNANCY, URINE   ____________________________________________   EKG    ____________________________________________    RADIOLOGY  Ultrasound  pelvis pending  ____________________________________________   PROCEDURES   ____________________________________________   INITIAL IMPRESSION / ASSESSMENT AND PLAN / ED COURSE  Pertinent labs & imaging results that were available during my care of the patient were reviewed by me and considered in my medical decision making (see chart for details).  Patient well appearing no acute distress, unremarkable vital signs.  Presents with right lower quadrant abdominal pain with vaginal discharge. Due to the absence of any appreciable adnexal mass on exam but with tenderness in the right lower quadrant, I recommended the patient will proceed with CT scan to evaluate for appendicitis, which she refuses. She relates that she previously had a hepatic sprains with the CT scan and therefore does not want to have a CT scan again. I suggested then we should get an ultrasound done which is not good at evaluating for appendicitis but will allow [place] to look for ovarian cyst or other adnexal masses. She is amenable to this. Initial labs unremarkable. There is bacteria in the urine, but urinalysis is otherwise not consistent with urinary tract infection. Wet prep does show moderate white blood cells in the vaginal fluid sample.   Patient has low suspicion for sexually transmitted infection, and will receive a follow-up call from lab or pharmacy if her GC chlamydia testing is positive and she could be treated at that time. For now, will follow-up on the  ultrasound results and plan for outpatient follow-up as the patient is afebrile, normal white blood cell count, very well-appearing nontoxic and does not have an acute abdomen.    ----------------------------------------- 10:04 PM on 11/26/2015 -----------------------------------------  Ultrasound still pending. Patient signed out to Dr. Derrill Kay to follow up on results.   ____________________________________________   FINAL CLINICAL IMPRESSION(S) / ED DIAGNOSES  Final diagnoses:  RLQ abdominal pain  Vaginal discharge       Portions of this note were generated with dragon dictation software. Dictation errors may occur despite best attempts at proofreading.   Sharman Cheek, MD 11/26/15 [phone]

## 2015-11-26 NOTE — ED Notes (Signed)
Pt taken to US

## 2015-11-26 NOTE — ED Notes (Signed)
Pt returned from US, ambulatory to room.

## 2015-11-28 LAB — URINE CULTURE

## 2016-03-17 ENCOUNTER — Encounter: Payer: Self-pay | Admitting: Obstetrics and Gynecology

## 2016-04-06 ENCOUNTER — Encounter: Payer: Self-pay | Admitting: Emergency Medicine

## 2016-04-06 ENCOUNTER — Emergency Department
Admission: EM | Admit: 2016-04-06 | Discharge: 2016-04-06 | Disposition: A | Discharge status: Home / Self Care | Payer: BLUE CROSS/BLUE SHIELD | Attending: Emergency Medicine | Admitting: Emergency Medicine

## 2016-04-06 ENCOUNTER — Emergency Department: Payer: BLUE CROSS/BLUE SHIELD

## 2016-04-06 DIAGNOSIS — Y999 Unspecified external cause status: Secondary | ICD-10-CM | POA: Insufficient documentation

## 2016-04-06 DIAGNOSIS — S82892A Other fracture of left lower leg, initial encounter for closed fracture: Secondary | ICD-10-CM | POA: Diagnosis not present

## 2016-04-06 DIAGNOSIS — S99912A Unspecified injury of left ankle, initial encounter: Secondary | ICD-10-CM | POA: Diagnosis present

## 2016-04-06 DIAGNOSIS — Y939 Activity, unspecified: Secondary | ICD-10-CM | POA: Diagnosis not present

## 2016-04-06 DIAGNOSIS — Y929 Unspecified place or not applicable: Secondary | ICD-10-CM | POA: Insufficient documentation

## 2016-04-06 DIAGNOSIS — Z87891 Personal history of nicotine dependence: Secondary | ICD-10-CM | POA: Diagnosis not present

## 2016-04-06 DIAGNOSIS — X501XXA Overexertion from prolonged static or awkward postures, initial encounter: Secondary | ICD-10-CM | POA: Insufficient documentation

## 2016-04-06 MED ORDER — IBUPROFEN 800 MG PO TABS
800.0000 mg | ORAL_TABLET | Freq: Once | ORAL | Status: AC
  Administered 2016-04-06: 800 mg via ORAL

## 2016-04-06 MED ORDER — IBUPROFEN 800 MG PO TABS
ORAL_TABLET | ORAL | Status: AC
  Filled 2016-04-06: qty 1

## 2016-04-06 MED ORDER — IBUPROFEN 800 MG PO TABS: 800.0000 mg | ORAL_TABLET | Freq: Three times a day (TID) | ORAL | 0 refills | Status: DC | PRN

## 2016-04-06 NOTE — ED Triage Notes (Signed)
Pt comes into the ED via EMS from home with c./o stepping on steps wrong trying to avoid falling on her son and turned her left ankle feeling it pop, having pain and swelling now..Marland Kitchen

## 2016-04-06 NOTE — ED Notes (Signed)
See triage note. States she stepped wrong   Fell twisted left foot/ankle  Unable to bear wt   Positive pulses

## 2016-04-06 NOTE — ED Provider Notes (Signed)
[facility] Emergency Department Provider Note ____________________________________________  Time seen: 1337  I have reviewed the triage vital signs and the nursing notes.  HISTORY  Chief Complaint  Ankle Pain  HPI Sara Boyd is a 25 y.o. female visit to the ED via EMS from home after she rolled her left ankle in the steps while trying to avoid following her young son. She describes turning her left ankle and feeling a pop to the lateral aspect. Since that time she's had pain and swelling to the dorsal and lateral aspect of the left ankle. She describes pain with attempts to bear weight. She denies any other injury at this time.   History reviewed. No pertinent past medical history.  There are no active problems to display for this patient.   Past Surgical History:  Procedure Laterality Date  . BLADDER SURGERY    . HERNIA REPAIR    . TONSILLECTOMY      Prior to Admission medications   Medication Sig Start Date End Date Taking? Authorizing Provider  cholecalciferol (VITAMIN D) 400 UNITS TABS tablet Take 5,000 Units by mouth daily.    Historical Provider, MD  ciprofloxacin (CIPRO) 500 MG tablet Take 1 tablet (500 mg total) by mouth 2 (two) times daily. 03/13/15   Delorise RoyalsJonathan D Cuthriell, PA-C  diazepam (VALIUM) 5 MG tablet Take 1 tablet (5 mg total) by mouth every 8 (eight) hours as needed for muscle spasms. 01/23/15   Sharman CheekPhillip Stafford, MD  gabapentin (NEURONTIN) 300 MG capsule Take 300 mg by mouth daily.    Historical Provider, MD  ibuprofen (ADVIL,MOTRIN) 800 MG tablet Take 1 tablet (800 mg total) by mouth every 8 (eight) hours as needed. 04/06/16   Julliana Whitmyer V Bacon Stevee Valenta, PA-C  Multiple Vitamin (MULTIVITAMIN) tablet Take 1 tablet by mouth daily.    Historical Provider, MD  naproxen (NAPROSYN) 500 MG tablet Take 1 tablet (500 mg total) by mouth 2 (two) times daily with a meal. 11/26/15   Sharman CheekPhillip Stafford, MD  phenazopyridine (PYRIDIUM) 95 MG tablet  Take 1 tablet (95 mg total) by mouth 3 (three) times daily as needed for pain. 03/13/15   Delorise RoyalsJonathan D Cuthriell, PA-C  predniSONE (DELTASONE) 20 MG tablet Take 3 tablets (60 mg total) by mouth daily. 09/23/14   Barbaraann Barthelina A Betancourt, NP    Allergies Pineapple; Sulfa antibiotics; Latex; Vicodin [hydrocodone-acetaminophen]; Codeine; and Penicillins  No family history on file.  Social History Social History  Substance Use Topics  . Smoking status: Former Games developermoker  . Smokeless tobacco: Never Used  . Alcohol use No    Review of Systems  Constitutional: Negative for fever. Cardiovascular: Negative for chest pain. Respiratory: Negative for shortness of breath. Gastrointestinal: Negative for abdominal pain, vomiting and diarrhea. Musculoskeletal: Negative for back pain. Left ankle pain as above. Skin: Negative for rash. Neurological: Negative for headaches, focal weakness or numbness. ____________________________________________  PHYSICAL EXAM:  VITAL SIGNS: ED Triage Vitals  Enc Vitals Group     BP 04/06/16 1241 117/79     Pulse Rate 04/06/16 1241 80     Resp 04/06/16 1241 18     Temp 04/06/16 1241 98.1 F (36.7 C)     Temp Source 04/06/16 1241 Oral     SpO2 04/06/16 1241 95 %     Weight 04/06/16 1242 290 lb (131.5 kg)     Height 04/06/16 1242 5\' 4"  (1.626 m)     Head Circumference --      Peak Flow --  Pain Score 04/06/16 1242 10     Pain Loc --      Pain Edu? --      Excl. in GC? --     Constitutional: Alert and oriented. Well appearing and in no distress. Head: Normocephalic and atraumatic. Cardiovascular:  Normal distal pulses. Respiratory: Normal respiratory effort.  Musculoskeletal: Left ankle with obvious joint effusion and swelling about the lateral malleolus. Patient is to palpation to the anterior talus. She has no calf or Achilles tenderness on exam. Left knee is benign. Nontender with normal range of motion in all extremities.  Neurologic:  Normal speech and  language. No gross focal neurologic deficits are appreciated. Skin:  Skin is warm, dry and intact. No rash noted. ____________________________________________   RADIOLOGY  Left Ankle IMPRESSION: There is an osteochondral fragment at the lateral tailor dome.  There is widening of the distal tibial fibular interosseous region. Disruption of the ankle joint cannot be excluded. CT may be helpful to further characterize bony derangement. MRI would be more helpful for soft tissue evaluation.  I, Haji Delaine, Charlesetta IvoryJenise V Bacon, personally viewed and evaluated these images (plain radiographs) as part of my medical decision making, as well as reviewing the written report by the radiologist. ____________________________________________  PROCEDURES  IBU 800 mg PO OCL posterior splint Crutches  ____________________________________________  INITIAL IMPRESSION / ASSESSMENT AND PLAN / ED COURSE  ----------------------------------------- 1:06 PM on 04/06/2016 ----------------------------------------- S/w Dr. Martha ClanKrasinski. He does not require an emergent MRI/CT for this injury. He suggest splinting and non-weight bearing on crutches. He suggests she can be managed by podiatry.   Patient with initial fracture management for a closed talar dome avulsion fracture and ankle sprain on the left. She is spitting appropriate OCL splint and crutches to ambulate with nonweightbearing gait. She will be referred to Dr. Ether GriffinsFowler for further fracture management.  Clinical Course    ____________________________________________  FINAL CLINICAL IMPRESSION(S) / ED DIAGNOSES  Final diagnoses:  Closed fracture of left ankle, initial encounter      Lissa HoardJenise V Bacon Marciel Offenberger, PA-C 04/06/16 1636    Loleta Roseory Forbach, MD 04/06/16 1800

## 2016-04-06 NOTE — Discharge Instructions (Signed)
Take ibuprofen as needed for pain and inflammation. Rest with leg elevated when seated. Do not bear any weight when walking with the crutches. Follow-up with Dr. Ether GriffinsFowler for further fracture care.

## 2016-04-08 ENCOUNTER — Other Ambulatory Visit: Payer: Self-pay | Admitting: Podiatry

## 2016-04-08 DIAGNOSIS — S82892A Other fracture of left lower leg, initial encounter for closed fracture: Secondary | ICD-10-CM

## 2016-04-08 DIAGNOSIS — M25572 Pain in left ankle and joints of left foot: Secondary | ICD-10-CM

## 2016-04-09 ENCOUNTER — Ambulatory Visit
Admission: RE | Admit: 2016-04-09 | Discharge: 2016-04-09 | Disposition: A | Discharge status: Home / Self Care | Payer: BLUE CROSS/BLUE SHIELD | Source: Ambulatory Visit | Attending: Podiatry | Admitting: Podiatry

## 2016-04-09 DIAGNOSIS — M25572 Pain in left ankle and joints of left foot: Secondary | ICD-10-CM | POA: Insufficient documentation

## 2016-04-09 DIAGNOSIS — M93972 Osteochondropathy, unspecified, left ankle and foot: Secondary | ICD-10-CM | POA: Diagnosis not present

## 2016-04-09 DIAGNOSIS — S82892A Other fracture of left lower leg, initial encounter for closed fracture: Secondary | ICD-10-CM | POA: Diagnosis not present

## 2016-04-13 MED ORDER — LIDOCAINE HCL (PF) 1 % IJ SOLN
INTRAMUSCULAR | Status: AC
  Filled 2016-04-13: qty 5

## 2016-05-24 ENCOUNTER — Telehealth: Payer: Self-pay | Admitting: Emergency Medicine

## 2016-05-24 ENCOUNTER — Emergency Department
Admission: EM | Admit: 2016-05-24 | Discharge: 2016-05-24 | Disposition: A | Discharge status: Home / Self Care | Payer: BLUE CROSS/BLUE SHIELD | Attending: Emergency Medicine | Admitting: Emergency Medicine

## 2016-05-24 DIAGNOSIS — Z5321 Procedure and treatment not carried out due to patient leaving prior to being seen by health care provider: Secondary | ICD-10-CM | POA: Insufficient documentation

## 2016-05-24 DIAGNOSIS — Z87891 Personal history of nicotine dependence: Secondary | ICD-10-CM | POA: Insufficient documentation

## 2016-05-24 DIAGNOSIS — R109 Unspecified abdominal pain: Secondary | ICD-10-CM | POA: Insufficient documentation

## 2016-05-24 LAB — URINALYSIS, COMPLETE (UACMP) WITH MICROSCOPIC
Bilirubin Urine: NEGATIVE
Glucose, UA: NEGATIVE mg/dL
HGB URINE DIPSTICK: NEGATIVE
Ketones, ur: NEGATIVE mg/dL
Nitrite: NEGATIVE
PROTEIN: NEGATIVE mg/dL
Specific Gravity, Urine: 1.012 (ref 1.005–1.030)
pH: 5 (ref 5.0–8.0)

## 2016-05-24 LAB — CBC
HEMATOCRIT: 39.6 % (ref 35.0–47.0)
Hemoglobin: 13.8 g/dL (ref 12.0–16.0)
MCH: 30.3 pg (ref 26.0–34.0)
MCHC: 34.9 g/dL (ref 32.0–36.0)
MCV: 86.9 fL (ref 80.0–100.0)
PLATELETS: 252 10*3/uL (ref 150–440)
RBC: 4.56 MIL/uL (ref 3.80–5.20)
RDW: 13.6 % (ref 11.5–14.5)
WBC: 6.6 10*3/uL (ref 3.6–11.0)

## 2016-05-24 LAB — BASIC METABOLIC PANEL
Anion gap: 11 (ref 5–15)
BUN: 14 mg/dL (ref 6–20)
CO2: 23 mmol/L (ref 22–32)
Calcium: 9.3 mg/dL (ref 8.9–10.3)
Chloride: 106 mmol/L (ref 101–111)
Creatinine, Ser: 0.81 mg/dL (ref 0.44–1.00)
GFR calc Af Amer: >60 mL/min (ref >60)
Glucose, Bld: 115 mg/dL — ABNORMAL HIGH (ref 65–99)
POTASSIUM: 3.9 mmol/L (ref 3.5–5.1)
SODIUM: 140 mmol/L (ref 135–145)

## 2016-05-24 LAB — POCT PREGNANCY, URINE: Preg Test, Ur: NEGATIVE

## 2016-05-24 NOTE — Telephone Encounter (Signed)
Called patient due to lwot to inquire about condition and follow up plans. Left message.   

## 2016-05-24 NOTE — ED Triage Notes (Signed)
Pt reports that starts over her kidney region and radiates around both flanks that started yesterday. Pt reports increased urination and orange juice looking urine with a foul odor.

## 2016-06-21 ENCOUNTER — Emergency Department
Admission: EM | Admit: 2016-06-21 | Discharge: 2016-06-21 | Disposition: A | Discharge status: Home / Self Care | Payer: Self-pay | Attending: Emergency Medicine | Admitting: Emergency Medicine

## 2016-06-21 ENCOUNTER — Emergency Department: Payer: Self-pay

## 2016-06-21 ENCOUNTER — Encounter: Payer: Self-pay | Admitting: Emergency Medicine

## 2016-06-21 DIAGNOSIS — M545 Low back pain, unspecified: Secondary | ICD-10-CM

## 2016-06-21 DIAGNOSIS — M2578 Osteophyte, vertebrae: Secondary | ICD-10-CM | POA: Insufficient documentation

## 2016-06-21 DIAGNOSIS — Z87891 Personal history of nicotine dependence: Secondary | ICD-10-CM | POA: Insufficient documentation

## 2016-06-21 DIAGNOSIS — Z791 Long term (current) use of non-steroidal anti-inflammatories (NSAID): Secondary | ICD-10-CM | POA: Insufficient documentation

## 2016-06-21 DIAGNOSIS — Z79899 Other long term (current) drug therapy: Secondary | ICD-10-CM | POA: Insufficient documentation

## 2016-06-21 LAB — URINALYSIS, COMPLETE (UACMP) WITH MICROSCOPIC
Bacteria, UA: NONE SEEN
Bilirubin Urine: NEGATIVE
GLUCOSE, UA: NEGATIVE mg/dL
Hgb urine dipstick: NEGATIVE
Ketones, ur: NEGATIVE mg/dL
Leukocytes, UA: NEGATIVE
Nitrite: NEGATIVE
PROTEIN: NEGATIVE mg/dL
Specific Gravity, Urine: 1.019 (ref 1.005–1.030)
pH: 5 (ref 5.0–8.0)

## 2016-06-21 LAB — PREGNANCY, URINE: PREG TEST UR: NEGATIVE

## 2016-06-21 MED ORDER — MELOXICAM 15 MG PO TABS: 15.0000 mg | ORAL_TABLET | Freq: Every day | ORAL | 0 refills | Status: DC

## 2016-06-21 NOTE — Discharge Instructions (Signed)
Follow up with orthopedics for symptoms that are not improving over the week. Return to the ER for symptoms that change or worsen if unable to schedule an appointment.  Take the meloxicam daily.

## 2016-06-21 NOTE — ED Triage Notes (Signed)
Low back pain, denies fall or injury at onset.

## 2016-06-21 NOTE — ED Notes (Signed)
See triage note   Lower back pain non radiating denies injury  Ambulates slowly no limp

## 2016-06-21 NOTE — ED Provider Notes (Signed)
[facility] Emergency Department Provider Note ____________________________________________  Time seen: Approximately 11:07 AM  I have reviewed the triage vital signs and the nursing notes.   HISTORY  Chief Complaint Back Pain    HPI Sara Boyd is a 26 y.o. female who presents to the emergency department for evaluation of lower back pain. She was recently treated for a UTI, but believes those symptoms have resolved. She states the pain is similar to when she "tore some muscles" in her back a few years ago.   History reviewed. No pertinent past medical history.  There are no active problems to display for this patient.   Past Surgical History:  Procedure Laterality Date  . BLADDER SURGERY    . HERNIA REPAIR    . TONSILLECTOMY      Prior to Admission medications   Medication Sig Start Date End Date Taking? Authorizing Provider  cholecalciferol (VITAMIN D) 400 UNITS TABS tablet Take 5,000 Units by mouth daily.    Historical Provider, MD  ciprofloxacin (CIPRO) 500 MG tablet Take 1 tablet (500 mg total) by mouth 2 (two) times daily. 03/13/15   Delorise RoyalsJonathan D Cuthriell, PA-C  diazepam (VALIUM) 5 MG tablet Take 1 tablet (5 mg total) by mouth every 8 (eight) hours as needed for muscle spasms. 01/23/15   Sharman CheekPhillip Stafford, MD  gabapentin (NEURONTIN) 300 MG capsule Take 300 mg by mouth daily.    Historical Provider, MD  ibuprofen (ADVIL,MOTRIN) 800 MG tablet Take 1 tablet (800 mg total) by mouth every 8 (eight) hours as needed. 04/06/16   Jenise V Bacon Menshew, PA-C  meloxicam (MOBIC) 15 MG tablet Take 1 tablet (15 mg total) by mouth daily. 06/21/16   Chinita Pesterari B Idamae Coccia, FNP  Multiple Vitamin (MULTIVITAMIN) tablet Take 1 tablet by mouth daily.    Historical Provider, MD  naproxen (NAPROSYN) 500 MG tablet Take 1 tablet (500 mg total) by mouth 2 (two) times daily with a meal. 11/26/15   Sharman CheekPhillip Stafford, MD  phenazopyridine (PYRIDIUM) 95 MG tablet Take 1 tablet  (95 mg total) by mouth 3 (three) times daily as needed for pain. 03/13/15   Delorise RoyalsJonathan D Cuthriell, PA-C  predniSONE (DELTASONE) 20 MG tablet Take 3 tablets (60 mg total) by mouth daily. 09/23/14   Barbaraann Barthelina A Betancourt, NP    Allergies Pineapple; Sulfa antibiotics; Latex; Vicodin [hydrocodone-acetaminophen]; Codeine; and Penicillins  No family history on file.  Social History Social History  Substance Use Topics  . Smoking status: Former Games developermoker  . Smokeless tobacco: Never Used  . Alcohol use No    Review of Systems Constitutional: No recent illness. Cardiovascular: Denies chest pain or palpitations. Respiratory: Denies shortness of breath. Musculoskeletal: Pain in lower back with radiation into both hips. Skin: Negative for rash, wound, lesion. Neurological: Negative for focal weakness or numbness.  ____________________________________________   PHYSICAL EXAM:  VITAL SIGNS: ED Triage Vitals  Enc Vitals Group     BP 06/21/16 1030 130/84     Pulse Rate 06/21/16 1030 72     Resp 06/21/16 1030 20     Temp 06/21/16 1030 98.6 F (37 C)     Temp Source 06/21/16 1030 Oral     SpO2 06/21/16 1030 99 %     Weight 06/21/16 1032 290 lb (131.5 kg)     Height 06/21/16 1032 5\' 4"  (1.626 m)     Head Circumference --      Peak Flow --      Pain Score 06/21/16 1032 7  Pain Loc --      Pain Edu? --      Excl. in GC? --     Constitutional: Alert and oriented. Well appearing and in no acute distress. Eyes: Conjunctivae are normal. EOMI. Head: Atraumatic. Neck: No stridor.  Respiratory: Normal respiratory effort.   Musculoskeletal: Diffuse, transverse lower back pain without focal midline tenderness.  Neurologic:  Normal speech and language. No gross focal neurologic deficits are appreciated. Speech is normal. No gait instability. Skin:  Skin is warm, dry and intact. Atraumatic. Psychiatric: Mood and affect are normal. Speech and behavior are  normal.  ____________________________________________   LABS (all labs ordered are listed, but only abnormal results are displayed)  Labs Reviewed  URINALYSIS, COMPLETE (UACMP) WITH MICROSCOPIC - Abnormal; Notable for the following:       Result Value   Color, Urine YELLOW (*)    APPearance CLEAR (*)    Squamous Epithelial / LPF 0-5 (*)    All other components within normal limits  PREGNANCY, URINE   ____________________________________________  RADIOLOGY  Mild L5/S1 degenerative disc disease. Otherwise, no acute findings on the lumbar spine images. ____________________________________________   PROCEDURES  Procedure(s) performed: None   ____________________________________________   INITIAL IMPRESSION / ASSESSMENT AND PLAN / ED COURSE     Pertinent labs & imaging results that were available during my care of the patient were reviewed by me and considered in my medical decision making (see chart for details).  26 year old female presenting to the emergency department for evaluation of lower back pain. Lumbar spine film and urinalysis negative for acute abnormality. She'll be treated with meloxicam and instructed to follow-up with orthopedics for symptoms that are not improving over the week. She was instructed to return to emergency department for symptoms that change or worsen if she is unable schedule appointment with her primary care provider or the orthopedic specialist. ____________________________________________   FINAL CLINICAL IMPRESSION(S) / ED DIAGNOSES  Final diagnoses:  Acute lumbar back pain  Osteophyte of vertebrae       Chinita Pester, FNP 06/21/16 1657    Rockne Menghini, MD 06/29/16 8119

## 2016-08-10 ENCOUNTER — Encounter: Payer: Self-pay | Admitting: Certified Nurse Midwife

## 2016-08-10 ENCOUNTER — Ambulatory Visit (INDEPENDENT_AMBULATORY_CARE_PROVIDER_SITE_OTHER): Payer: BLUE CROSS/BLUE SHIELD | Admitting: Certified Nurse Midwife

## 2016-08-10 VITALS — BP 122/70 | HR 83 | Ht 64.0 in | Wt 299.2 lb

## 2016-08-10 DIAGNOSIS — N926 Irregular menstruation, unspecified: Secondary | ICD-10-CM | POA: Diagnosis not present

## 2016-08-10 DIAGNOSIS — Z3201 Encounter for pregnancy test, result positive: Secondary | ICD-10-CM | POA: Diagnosis not present

## 2016-08-10 DIAGNOSIS — O09891 Supervision of other high risk pregnancies, first trimester: Secondary | ICD-10-CM

## 2016-08-10 DIAGNOSIS — O09211 Supervision of pregnancy with history of pre-term labor, first trimester: Secondary | ICD-10-CM

## 2016-08-10 LAB — POCT URINE PREGNANCY: PREG TEST UR: POSITIVE — AB

## 2016-08-10 MED ORDER — DOXYLAMINE-PYRIDOXINE 10-10 MG PO TBEC: 2.0000 | DELAYED_RELEASE_TABLET | Freq: Every day | ORAL | 5 refills | Status: DC

## 2016-08-10 NOTE — Progress Notes (Signed)
GYN ENCOUNTER NOTE  Subjective:       Sara Boyd is a 26 y.o. [phone] female is here for pregnancy confirmation.   This pregnancy was unplanned, but not prevented. FOB status is unknown and pt is unsure about her desire to continue this pregnancy.   [name] reports nausea without vomiting.   Denies difficulty breathing and respiratory distress, chest pain, abdominal pain, vaginal bleeding, and leg pain or swelling.   History significant for hyperemesis gravidarum, PPROM, and PTB.    Gynecologic History  Patient's last menstrual period was 07/08/2016.   EDD: 04/14/2017  Gestational age: 45 weeks 5 days   Date of conception: 07/22/2016  Contraception: none   Obstetric History OB History  Gravida Para Term Preterm AB Living  0 2  SAB TAB Ectopic Multiple Live Births  0 0 0 0      # Outcome Date GA Lbr Len/2nd Weight Sex Delivery Anes PTL Lv  3 Current           2 Term 2014    M      1 Preterm 2010    F        Obstetric Comments  Pt water ruptured @ 19 weeks, bed rest, baby with CP    History reviewed. No pertinent past medical history.  Past Surgical History:  Procedure Laterality Date  . BLADDER SURGERY    . HERNIA REPAIR    . TONSILLECTOMY      No current outpatient prescriptions on file prior to visit.   No current facility-administered medications on file prior to visit.     Allergies  Allergen Reactions  . Pineapple Shortness Of Breath    Pt states her throat shuts completely. Anything pineapple, pt states it is juice, the whole fruit, or flavoring.   . Sulfa Antibiotics Shortness Of Breath    Pt states her throat closes as well, tightness.   . Latex Itching    Pt states her throat also starts closing.   . Vicodin [Hydrocodone-Acetaminophen] Other (See Comments)    Hallucinations.   . Codeine Rash    Pt states it feels like her skin is coming off.   . Penicillins Rash    Pt states it feels like her skin is coming off.      Social History   Social History  . Marital status: Married    Spouse name: N/A  . Number of children: N/A  . Years of education: N/A   Occupational History  . Not on file.   Social History Main Topics  . Smoking status: Former Games developer  . Smokeless tobacco: Never Used  . Alcohol use No  . Drug use: No  . Sexual activity: Yes   Other Topics Concern  . Not on file   Social History Narrative  . No narrative on file    History reviewed. No pertinent family history.  The following portions of the patient's history were reviewed and updated as appropriate: allergies, current medications, past family history, past medical history, past social history, past surgical history and problem list.  Review of Systems  Review of Systems - Negative except as noted above History obtained from the patient  Objective:   BP 122/70   Pulse 83   Ht  (1.626 m)   Wt 299 lb 3.2 oz (135.7 kg)   LMP 07/08/2016   BMI 51.36 kg/m   Alert and oriented x 4, no apparent distress  Positive UPT  Assessment:   1. Missed menses  - POCT urine pregnancy   2. Positive pregnancy test  3. Irregular menses  Plan:   Information given on [facility] [employer]  Encouraged PNV with folic acid and DHA  Rx Dicelgis, instructions given  Reviewed red flag symptoms and when to call  RTC x 4 weeks for dating/viability [place] and Nurse Intake   Gunnar Bulla, CNM

## 2016-08-10 NOTE — Patient Instructions (Signed)
First Trimester of Pregnancy The first trimester of pregnancy is from week 1 until the end of week 13 (months 1 through 3). A week after a sperm fertilizes an egg, the egg will implant on the wall of the uterus. This embryo will begin to develop into a baby. Genes from you and your partner will form the baby. The female genes will determine whether the baby will be a boy or a girl. At 6-8 weeks, the eyes and face will be formed, and the heartbeat can be seen on ultrasound. At the end of 12 weeks, all the baby's organs will be formed. Now that you are pregnant, you will want to do everything you can to have a healthy baby. Two of the most important things are to get good prenatal care and to follow your health care provider's instructions. Prenatal care is all the medical care you receive before the baby's birth. This care will help prevent, find, and treat any problems during the pregnancy and childbirth. Body changes during your first trimester Your body goes through many changes during pregnancy. The changes vary from woman to woman.  You may gain or lose a couple of pounds at first.  You may feel sick to your stomach (nauseous) and you may throw up (vomit). If the vomiting is uncontrollable, call your health care provider.  You may tire easily.  You may develop headaches that can be relieved by medicines. All medicines should be approved by your health care provider.  You may urinate more often. Painful urination may mean you have a bladder infection.  You may develop heartburn as a result of your pregnancy.  You may develop constipation because certain hormones are causing the muscles that push stool through your intestines to slow down.  You may develop hemorrhoids or swollen veins (varicose veins).  Your breasts may begin to grow larger and become tender. Your nipples may stick out more, and the tissue that surrounds them (areola) may become darker.  Your gums may bleed and may be  sensitive to brushing and flossing.  Dark spots or blotches (chloasma, mask of pregnancy) may develop on your face. This will likely fade after the baby is born.  Your menstrual periods will stop.  You may have a loss of appetite.  You may develop cravings for certain kinds of food.  You may have changes in your emotions from day to day, such as being excited to be pregnant or being concerned that something may go wrong with the pregnancy and baby.  You may have more vivid and strange dreams.  You may have changes in your hair. These can include thickening of your hair, rapid growth, and changes in texture. Some women also have hair loss during or after pregnancy, or hair that feels dry or thin. Your hair will most likely return to normal after your baby is born.  What to expect at prenatal visits During a routine prenatal visit:  You will be weighed to make sure you and the baby are growing normally.  Your blood pressure will be taken.  Your abdomen will be measured to track your baby's growth.  The fetal heartbeat will be listened to between weeks 10 and 14 of your pregnancy.  Test results from any previous visits will be discussed.  Your health care provider may ask you:  How you are feeling.  If you are feeling the baby move.  If you have had any abnormal symptoms, such as leaking fluid, bleeding, severe headaches,   or abdominal cramping.  If you are using any tobacco products, including cigarettes, chewing tobacco, and electronic cigarettes.  If you have any questions.  Other tests that may be performed during your first trimester include:  Blood tests to find your blood type and to check for the presence of any previous infections. The tests will also be used to check for low iron levels (anemia) and protein on red blood cells (Rh antibodies). Depending on your risk factors, or if you previously had diabetes during pregnancy, you may have tests to check for high blood  sugar that affects pregnant women (gestational diabetes).  Urine tests to check for infections, diabetes, or protein in the urine.  An ultrasound to confirm the proper growth and development of the baby.  Fetal screens for spinal cord problems (spina bifida) and Down syndrome.  HIV (human immunodeficiency virus) testing. Routine prenatal testing includes screening for HIV, unless you choose not to have this test.  You may need other tests to make sure you and the baby are doing well.  Follow these instructions at home: Medicines  Follow your health care provider's instructions regarding medicine use. Specific medicines may be either safe or unsafe to take during pregnancy.  Take a prenatal vitamin that contains at least 600 micrograms (mcg) of folic acid.  If you develop constipation, try taking a stool softener if your health care provider approves. Eating and drinking  Eat a balanced diet that includes fresh fruits and vegetables, whole grains, good sources of protein such as meat, eggs, or tofu, and low-fat dairy. Your health care provider will help you determine the amount of weight gain that is right for you.  Avoid raw meat and uncooked cheese. These carry germs that can cause birth defects in the baby.  Eating four or five small meals rather than three large meals a day may help relieve nausea and vomiting. If you start to feel nauseous, eating a few soda crackers can be helpful. Drinking liquids between meals, instead of during meals, also seems to help ease nausea and vomiting.  Limit foods that are high in fat and processed sugars, such as fried and sweet foods.  To prevent constipation: ? Eat foods that are high in fiber, such as fresh fruits and vegetables, whole grains, and beans. ? Drink enough fluid to keep your urine clear or pale yellow. Activity  Exercise only as directed by your health care provider. Most women can continue their usual exercise routine during  pregnancy. Try to exercise for 30 minutes at least 5 days a week. Exercising will help you: ? Control your weight. ? Stay in shape. ? Be prepared for labor and delivery.  Experiencing pain or cramping in the lower abdomen or lower back is a good sign that you should stop exercising. Check with your health care provider before continuing with normal exercises.  Try to avoid standing for long periods of time. Move your legs often if you must stand in one place for a long time.  Avoid heavy lifting.  Wear low-heeled shoes and practice good posture.  You may continue to have sex unless your health care provider tells you not to. Relieving pain and discomfort  Wear a good support bra to relieve breast tenderness.  Take warm sitz baths to soothe any pain or discomfort caused by hemorrhoids. Use hemorrhoid cream if your health care provider approves.  Rest with your legs elevated if you have leg cramps or low back pain.  If you develop   varicose veins in your legs, wear support hose. Elevate your feet for 15 minutes, 3-4 times a day. Limit salt in your diet. Prenatal care  Schedule your prenatal visits by the twelfth week of pregnancy. They are usually scheduled monthly at first, then more often in the last 2 months before delivery.  Write down your questions. Take them to your prenatal visits.  Keep all your prenatal visits as told by your health care provider. This is important. Safety  Wear your seat belt at all times when driving.  Make a list of emergency phone numbers, including numbers for family, friends, the hospital, and police and fire departments. General instructions  Ask your health care provider for a referral to a local prenatal education class. Begin classes no later than the beginning of month 6 of your pregnancy.  Ask for help if you have counseling or nutritional needs during pregnancy. Your health care provider can offer advice or refer you to specialists for help  with various needs.  Do not use hot tubs, steam rooms, or saunas.  Do not douche or use tampons or scented sanitary pads.  Do not cross your legs for long periods of time.  Avoid cat litter boxes and soil used by cats. These carry germs that can cause birth defects in the baby and possibly loss of the fetus by miscarriage or stillbirth.  Avoid all smoking, herbs, alcohol, and medicines not prescribed by your health care provider. Chemicals in these products affect the formation and growth of the baby.  Do not use any products that contain nicotine or tobacco, such as cigarettes and e-cigarettes. If you need help quitting, ask your health care provider. You may receive counseling support and other resources to help you quit.  Schedule a dentist appointment. At home, brush your teeth with a soft toothbrush and be gentle when you floss. Contact a health care provider if:  You have dizziness.  You have mild pelvic cramps, pelvic pressure, or nagging pain in the abdominal area.  You have persistent nausea, vomiting, or diarrhea.  You have a bad smelling vaginal discharge.  You have pain when you urinate.  You notice increased swelling in your face, hands, legs, or ankles.  You are exposed to fifth disease or chickenpox.  You are exposed to German measles (rubella) and have never had it. Get help right away if:  You have a fever.  You are leaking fluid from your vagina.  You have spotting or bleeding from your vagina.  You have severe abdominal cramping or pain.  You have rapid weight gain or loss.  You vomit blood or material that looks like coffee grounds.  You develop a severe headache.  You have shortness of breath.  You have any kind of trauma, such as from a fall or a car accident. Summary  The first trimester of pregnancy is from week 1 until the end of week 13 (months 1 through 3).  Your body goes through many changes during pregnancy. The changes vary from  woman to woman.  You will have routine prenatal visits. During those visits, your health care provider will examine you, discuss any test results you may have, and talk with you about how you are feeling. This information is not intended to replace advice given to you by your health care provider. Make sure you discuss any questions you have with your health care provider. Document Released: 04/20/2001 Document Revised: 04/07/2016 Document Reviewed: 04/07/2016 Elsevier Interactive Patient Education  2017 [employer]  

## 2016-08-14 ENCOUNTER — Encounter: Payer: Self-pay | Admitting: Emergency Medicine

## 2016-08-14 ENCOUNTER — Emergency Department
Admission: EM | Admit: 2016-08-14 | Discharge: 2016-08-14 | Disposition: A | Discharge status: Home / Self Care | Payer: BLUE CROSS/BLUE SHIELD | Attending: Emergency Medicine | Admitting: Emergency Medicine

## 2016-08-14 ENCOUNTER — Emergency Department: Payer: BLUE CROSS/BLUE SHIELD

## 2016-08-14 DIAGNOSIS — O209 Hemorrhage in early pregnancy, unspecified: Secondary | ICD-10-CM | POA: Diagnosis present

## 2016-08-14 DIAGNOSIS — Z3A01 Less than 8 weeks gestation of pregnancy: Secondary | ICD-10-CM | POA: Diagnosis not present

## 2016-08-14 DIAGNOSIS — O200 Threatened abortion: Secondary | ICD-10-CM | POA: Diagnosis not present

## 2016-08-14 DIAGNOSIS — Z87891 Personal history of nicotine dependence: Secondary | ICD-10-CM | POA: Diagnosis not present

## 2016-08-14 DIAGNOSIS — O4690 Antepartum hemorrhage, unspecified, unspecified trimester: Secondary | ICD-10-CM

## 2016-08-14 LAB — CBC WITH DIFFERENTIAL/PLATELET
Basophils Absolute: 0.1 10*3/uL (ref 0–0.1)
Basophils Relative: 1 %
EOS PCT: 1 %
Eosinophils Absolute: 0.1 10*3/uL (ref 0–0.7)
HCT: 38.9 % (ref 35.0–47.0)
Hemoglobin: 13.2 g/dL (ref 12.0–16.0)
LYMPHS ABS: 1.2 10*3/uL (ref 1.0–3.6)
LYMPHS PCT: 17 %
MCH: 29.8 pg (ref 26.0–34.0)
MCHC: 33.9 g/dL (ref 32.0–36.0)
MCV: 87.8 fL (ref 80.0–100.0)
MONOS PCT: 6 %
Monocytes Absolute: 0.4 10*3/uL (ref 0.2–0.9)
Neutro Abs: 5.2 10*3/uL (ref 1.4–6.5)
Neutrophils Relative %: 75 %
PLATELETS: 235 10*3/uL (ref 150–440)
RBC: 4.43 MIL/uL (ref 3.80–5.20)
RDW: 14.4 % (ref 11.5–14.5)
WBC: 6.9 10*3/uL (ref 3.6–11.0)

## 2016-08-14 LAB — ABO/RH
ABO/RH(D): O POS
DAT, IGG: NEGATIVE
WEAK D: POSITIVE

## 2016-08-14 LAB — WET PREP, GENITAL
CLUE CELLS WET PREP: NONE SEEN
Sperm: NONE SEEN
TRICH WET PREP: NONE SEEN
Yeast Wet Prep HPF POC: NONE SEEN

## 2016-08-14 LAB — URINALYSIS, COMPLETE (UACMP) WITH MICROSCOPIC
Bacteria, UA: NONE SEEN
Bilirubin Urine: NEGATIVE
Glucose, UA: 50 mg/dL — AB
Hgb urine dipstick: NEGATIVE
KETONES UR: NEGATIVE mg/dL
Leukocytes, UA: NEGATIVE
Nitrite: NEGATIVE
PH: 5 (ref 5.0–8.0)
Protein, ur: NEGATIVE mg/dL
SPECIFIC GRAVITY, URINE: 1.021 (ref 1.005–1.030)

## 2016-08-14 LAB — BASIC METABOLIC PANEL
Anion gap: 7 (ref 5–15)
BUN: 11 mg/dL (ref 6–20)
CALCIUM: 9 mg/dL (ref 8.9–10.3)
CO2: 23 mmol/L (ref 22–32)
Chloride: 109 mmol/L (ref 101–111)
Creatinine, Ser: 0.8 mg/dL (ref 0.44–1.00)
GFR calc Af Amer: >60 mL/min (ref >60)
GLUCOSE: 103 mg/dL — AB (ref 65–99)
Potassium: 3.8 mmol/L (ref 3.5–5.1)
Sodium: 139 mmol/L (ref 135–145)

## 2016-08-14 LAB — CHLAMYDIA/NGC RT PCR (ARMC ONLY)
CHLAMYDIA TR: DETECTED — AB
N GONORRHOEAE: NOT DETECTED

## 2016-08-14 LAB — HCG, QUANTITATIVE, PREGNANCY: HCG, BETA CHAIN, QUANT, S: 4251 m[IU]/mL — AB (ref <5)

## 2016-08-14 NOTE — ED Notes (Signed)

## 2016-08-14 NOTE — ED Triage Notes (Signed)
Pt states that she is approximately [redacted] weeks pregnant. Pt has been having cramping in the Left lower abdomen x 1 week, pt states that this morning she started bleeding, blood is bright red. Pt denies passing clots or tissue, last intercourse was 4/4 or 4/5

## 2016-08-14 NOTE — Discharge Instructions (Signed)
Please follow-up with your OB gynecologist in 2 days for recheck. Return to the emergency department sooner for any new or worsening symptoms such as worsening pain fevers chills or for any other concerns. Please continue with pelvic rest until he follow up with your OB gynecologist.  It was a pleasure to take care of you today, and thank you for coming to our emergency department.  If you have any questions or concerns before leaving please ask the nurse to grab me and I'm more than happy to go through your aftercare instructions again.  If you were prescribed any opioid pain medication today such as Norco, Vicodin, Percocet, morphine, hydrocodone, or oxycodone please make sure you do not drive when you are taking this medication as it can alter your ability to drive safely.  If you have any concerns once you are home that you are not improving or are in fact getting worse before you can make it to your follow-up appointment, please do not hesitate to call 911 and come back for further evaluation.  Merrily Brittle MD  Results for orders placed or performed during the hospital encounter of 08/14/16  Wet prep, genital  Result Value Ref Range   Yeast Wet Prep HPF POC NONE SEEN NONE SEEN   Trich, Wet Prep NONE SEEN NONE SEEN   Clue Cells Wet Prep HPF POC NONE SEEN NONE SEEN   WBC, Wet Prep HPF POC TOO NUMEROUS TO COUNT (A) NONE SEEN   Sperm NONE SEEN   hCG, quantitative, pregnancy  Result Value Ref Range   hCG, Beta Chain, Quant, S 4,251 (H) <5 mIU/mL  CBC with Differential  Result Value Ref Range   WBC 6.9 3.6 - 11.0 K/uL   RBC 4.43 3.80 - 5.20 MIL/uL   Hemoglobin 13.2 12.0 - 16.0 g/dL   HCT 40.9 81.1 - 91.4 %   MCV 87.8 80.0 - 100.0 fL   MCH 29.8 26.0 - 34.0 pg   MCHC 33.9 32.0 - 36.0 g/dL   RDW 78.2 95.6 - 21.3 %   Platelets 235 150 - 440 K/uL   Neutrophils Relative % 75 %   Neutro Abs 5.2 1.4 - 6.5 K/uL   Lymphocytes Relative 17 %   Lymphs Abs 1.2 1.0 - 3.6 K/uL   Monocytes  Relative 6 %   Monocytes Absolute 0.4 0.2 - 0.9 K/uL   Eosinophils Relative 1 %   Eosinophils Absolute 0.1 0 - 0.7 K/uL   Basophils Relative 1 %   Basophils Absolute 0.1 0 - 0.1 K/uL  Basic metabolic panel  Result Value Ref Range   Sodium 139 135 - 145 mmol/L   Potassium 3.8 3.5 - 5.1 mmol/L   Chloride 109 101 - 111 mmol/L   CO2 23 22 - 32 mmol/L   Glucose, Bld 103 (H) 65 - 99 mg/dL   BUN 11 6 - 20 mg/dL   Creatinine, Ser 0.86 0.44 - 1.00 mg/dL   Calcium 9.0 8.9 - 57.8 mg/dL   GFR calc non Af Amer >60 >60 mL/min   GFR calc Af Amer >60 >60 mL/min   Anion gap 7 5 - 15  Urinalysis, Complete w Microscopic  Result Value Ref Range   Color, Urine YELLOW (A) YELLOW   APPearance HAZY (A) CLEAR   Specific Gravity, Urine 1.021 1.005 - 1.030   pH 5.0 5.0 - 8.0   Glucose, UA 50 (A) NEGATIVE mg/dL   Hgb urine dipstick NEGATIVE NEGATIVE   Bilirubin Urine NEGATIVE NEGATIVE  Ketones, ur NEGATIVE NEGATIVE mg/dL   Protein, ur NEGATIVE NEGATIVE mg/dL   Nitrite NEGATIVE NEGATIVE   Leukocytes, UA NEGATIVE NEGATIVE   RBC / HPF 0-5 0 - 5 RBC/hpf   WBC, UA 0-5 0 - 5 WBC/hpf   Bacteria, UA NONE SEEN NONE SEEN   Squamous Epithelial / LPF 0-5 (A) NONE SEEN   Mucous PRESENT   ABO/Rh  Result Value Ref Range   ABO/RH(D) O POS    DAT, IgG NEG    Weak D POS    US Ob Comp Less 14 Wks  Result Date: 08/14/2016 CLINICAL DATA:  Pregnant, spotting, left lower quadrant pain, beta HCG 4251 EXAM: OBSTETRIC <14 WK [place] AND TRANSVAGINAL OB US TECHNIQUE: Both transabdominal and transvaginal ultrasound examinations were performed for complete evaluation of the gestation as well as the maternal uterus, adnexal regions, and pelvic cul-de-sac. Transvaginal technique was performed to assess early pregnancy. COMPARISON:  None. FINDINGS: Intrauterine gestational sac: Single Yolk sac:  Not Visualized. Embryo:  Not Visualized. MSD: 6.1  mm   5 w   2  d Subchorionic hemorrhage:  None visualized. Maternal uterus/adnexae:  Bilateral ovaries are not discretely visualized. No free fluid. IMPRESSION: Single intrauterine gestation, measuring 5 weeks 2 days by mean sac diameter. Consider follow-up pelvic ultrasound in 14 days to confirm viability, as clinically warranted. Electronically Signed   By: Charline Bills M.D.   On: 08/14/2016 17:03   US Ob Transvaginal  Result Date: 08/14/2016 CLINICAL DATA:  Pregnant, spotting, left lower quadrant pain, beta HCG 4251 EXAM: OBSTETRIC <14 WK [place] AND TRANSVAGINAL OB US TECHNIQUE: Both transabdominal and transvaginal ultrasound examinations were performed for complete evaluation of the gestation as well as the maternal uterus, adnexal regions, and pelvic cul-de-sac. Transvaginal technique was performed to assess early pregnancy. COMPARISON:  None. FINDINGS: Intrauterine gestational sac: Single Yolk sac:  Not Visualized. Embryo:  Not Visualized. MSD: 6.1  mm   5 w   2  d Subchorionic hemorrhage:  None visualized. Maternal uterus/adnexae: Bilateral ovaries are not discretely visualized. No free fluid. IMPRESSION: Single intrauterine gestation, measuring 5 weeks 2 days by mean sac diameter. Consider follow-up pelvic ultrasound in 14 days to confirm viability, as clinically warranted. Electronically Signed   By: Charline Bills M.D.   On: 08/14/2016 17:03

## 2016-08-14 NOTE — ED Provider Notes (Signed)
[facility] Emergency Department Provider Note ____________________________________________   I have reviewed the triage vital signs and the triage nursing note.  HISTORY  Chief Complaint Vaginal Bleeding   Historian Patient  HPI Sara Boyd is a 26 y.o. female G3 P2, presents with positive urine pregnancy test, proximally 5 weeks by her last menstrual period, presents today with a small amount of bright red blood on the tissue when she wiped from urinating. No dysuria. Mild lower left-sided abdominal discomfort for the last couple days. She discussed this with her midwife by Margo Aye was referred to the ER for further evaluation today.  No shortness breath, trouble breathing, palpitations, fever, diarrhea, nausea or vomiting.    History reviewed. No pertinent past medical history.  Patient Active Problem List   Diagnosis Date Noted  . History of preterm delivery, currently pregnant in first trimester 08/10/2016    Past Surgical History:  Procedure Laterality Date  . BLADDER SURGERY    . HERNIA REPAIR    . TONSILLECTOMY      Prior to Admission medications   Medication Sig Start Date End Date Taking? Authorizing Provider  Doxylamine-Pyridoxine (DICLEGIS) 10-10 MG TBEC Take 2 tablets by mouth at bedtime. If symptoms persist, add one tablet in the morning and one in the afternoon 08/10/16   Gunnar Bulla, CNM    Allergies  Allergen Reactions  . Pineapple Shortness Of Breath    Pt states her throat shuts completely. Anything pineapple, pt states it is juice, the whole fruit, or flavoring.   . Sulfa Antibiotics Shortness Of Breath    Pt states her throat closes as well, tightness.   . Latex Itching    Pt states her throat also starts closing.   . Vicodin [Hydrocodone-Acetaminophen] Other (See Comments)    Hallucinations.   . Codeine Rash    Pt states it feels like her skin is coming off.   . Penicillins Rash    Pt states it  feels like her skin is coming off.     No family history on file.  Social History Social History  Substance Use Topics  . Smoking status: Former Games developer  . Smokeless tobacco: Never Used  . Alcohol use No    Review of Systems  Constitutional: Negative for fever. Eyes: Negative for visual changes. ENT: Negative for sore throat. Cardiovascular: Negative for chest pain. Respiratory: Negative for shortness of breath. Gastrointestinal: Negative for vomiting and diarrhea. Genitourinary: Negative for dysuria.  Negative for vaginal discharge. Musculoskeletal: Negative for back pain.  Mild pain to her left shoulder blade. Skin: Negative for rash. Neurological: Negative for headache. 10 point Review of Systems otherwise negative ____________________________________________   PHYSICAL EXAM:  VITAL SIGNS: ED Triage Vitals  Enc Vitals Group     BP 08/14/16 1408 126/68     Pulse Rate 08/14/16 1408 80     Resp 08/14/16 1408 18     Temp --      Temp src --      SpO2 08/14/16 1408 99 %     Weight 08/14/16 1235 299 lb (135.6 kg)     Height 08/14/16 1411  (1.626 m)     Head Circumference --      Peak Flow --      Pain Score 08/14/16 1234 5     Pain Loc --      Pain Edu? --      Excl. in GC? --      Constitutional: Alert and  oriented. Well appearing and in no distress. HEENT   Head: Normocephalic and atraumatic.      Eyes: Conjunctivae are normal. PERRL. Normal extraocular movements.      Ears:         Nose: No congestion/rhinnorhea.   Mouth/Throat: Mucous membranes are moist.   Neck: No stridor. Cardiovascular/Chest: Normal rate, regular rhythm.  No murmurs, rubs, or gallops. Respiratory: Normal respiratory effort without tachypnea nor retractions. Breath sounds are clear and equal bilaterally. No wheezes/rales/rhonchi. Gastrointestinal: Soft. No distention, no guarding, no rebound. Morbidly obese, mild tenderness left lower quadrant.  Genitourinary/rectal: No  vaginal bleeding. Small amount of vaginal discharge. No cervicitis. Mild tenderness left adnexa without mass. Musculoskeletal: Nontender with normal range of motion in all extremities. No joint effusions.  No lower extremity tenderness.  No edema. Neurologic:  Normal speech and language. No gross or focal neurologic deficits are appreciated. Skin:  Skin is warm, dry and intact. No rash noted. Psychiatric: Mood and affect are normal. Speech and behavior are normal. Patient exhibits appropriate insight and judgment.   ____________________________________________  LABS (pertinent positives/negatives)  Labs Reviewed  WET PREP, GENITAL - Abnormal; Notable for the following:       Result Value   WBC, Wet Prep HPF POC TOO NUMEROUS TO COUNT (*)    All other components within normal limits  HCG, QUANTITATIVE, PREGNANCY - Abnormal; Notable for the following:    hCG, Beta Chain, Quant, S 4,251 (*)    All other components within normal limits  BASIC METABOLIC PANEL - Abnormal; Notable for the following:    Glucose, Bld 103 (*)    All other components within normal limits  URINALYSIS, COMPLETE (UACMP) WITH MICROSCOPIC - Abnormal; Notable for the following:    Color, Urine YELLOW (*)    APPearance HAZY (*)    Glucose, UA 50 (*)    Squamous Epithelial / LPF 0-5 (*)    All other components within normal limits  CHLAMYDIA/NGC RT PCR ([facility] ONLY)  CBC WITH DIFFERENTIAL/PLATELET  ABO/RH    ____________________________________________    EKG I, Governor Rooks, MD, the attending physician have personally viewed and interpreted all ECGs.  None ____________________________________________  RADIOLOGY All Xrays were viewed by me. Imaging interpreted by Radiologist.  Ultrasound pelvic and transvaginal: Pending __________________________________________  PROCEDURES  Procedure(s) performed: None  Critical Care performed: None  ____________________________________________   ED  COURSE / ASSESSMENT AND PLAN  Pertinent labs & imaging results that were available during my care of the patient were reviewed by me and considered in my medical decision making (see chart for details).   Blood beta Quant about 4000. Vaginal exam without bleeding, but history of small amount of bleeding. Ultrasound will be obtained.  She overall appears stable. We discussed, that if she is in fact [redacted] weeks pregnant, we may not see the heart beat yet. Patient care transferred to Dr. Lucas Mallow at shift change for 30 p.m. Ultrasound pending.   CONSULTATIONS:   None   Patient / Family / Caregiver informed of clinical course, medical decision-making process, and agree with plan.     ___________________________________________   FINAL CLINICAL IMPRESSION(S) / ED DIAGNOSES   Final diagnoses:  Vaginal bleeding in pregnancy              Note: This dictation was prepared with Dragon dictation. Any transcriptional errors that result from this process are unintentional    Governor Rooks, MD 08/14/16 [phone]

## 2016-08-14 NOTE — ED Provider Notes (Signed)
Care signed over from Dr. Shaune Pollack. Briefly the patient is 26 years old with first trimester vaginal bleeding pending results of her ultrasound. Fortunately her ultrasound shows a single live intrauterine pregnancy. I discussed pelvic rest with the patient and she understands she must follow up with her OB gynecologist in 2 days.   Sara Brittle, MD 08/14/16 1728

## 2016-08-16 ENCOUNTER — Telehealth: Payer: Self-pay | Admitting: Emergency Medicine

## 2016-08-16 NOTE — Telephone Encounter (Signed)
Called patient to inform of positive chlamydia and need for treatment.  Per dr Don Perking, call in azithromycin 1 gram po.  Called med to [facility] and explained to patient.

## 2016-08-17 ENCOUNTER — Other Ambulatory Visit: Payer: Self-pay | Admitting: Certified Nurse Midwife

## 2016-08-17 ENCOUNTER — Ambulatory Visit (INDEPENDENT_AMBULATORY_CARE_PROVIDER_SITE_OTHER): Payer: BLUE CROSS/BLUE SHIELD | Admitting: Certified Nurse Midwife

## 2016-08-17 ENCOUNTER — Encounter: Payer: Self-pay | Admitting: Certified Nurse Midwife

## 2016-08-17 VITALS — BP 107/90 | HR 73 | Wt 297.8 lb

## 2016-08-17 DIAGNOSIS — R102 Pelvic and perineal pain: Secondary | ICD-10-CM | POA: Diagnosis not present

## 2016-08-17 DIAGNOSIS — O209 Hemorrhage in early pregnancy, unspecified: Secondary | ICD-10-CM

## 2016-08-17 DIAGNOSIS — O26891 Other specified pregnancy related conditions, first trimester: Secondary | ICD-10-CM

## 2016-08-17 DIAGNOSIS — O200 Threatened abortion: Secondary | ICD-10-CM

## 2016-08-17 LAB — POCT URINALYSIS DIPSTICK
Bilirubin, UA: NEGATIVE
GLUCOSE UA: NEGATIVE
Ketones, UA: NEGATIVE
Leukocytes, UA: NEGATIVE
Nitrite, UA: NEGATIVE
Protein, UA: NEGATIVE
SPEC GRAV UA: 1.025 (ref 1.030–1.035)
UROBILINOGEN UA: NEGATIVE (ref ?–2.0)
pH, UA: 8.5 — AB (ref 5.0–8.0)

## 2016-08-17 NOTE — Progress Notes (Signed)
Subjective:    Sara Boyd is a 26 y.o. female. Sara Boyd reports bleeding and left sided back pain since . She is not in acute distress. She was seen in the ED for evaluation . U/S completed and HCG. This past weekend 08/14/16. Ultrasound show single intrauterine gestation, measuring 5 weeks 2 days by mean sac. HCG: 4,251 Currently her bleeding has decreased but she continues to have back pain . Denies hx of kidney stones.  The following portions of the patient's history were reviewed and updated as appropriate: allergies, current medications, past family history, past medical history, past social history, past surgical history and problem list.  Review of Systems Pertinent items are noted in HPI.     Objective:     BP 107/90   Pulse 73   Wt 297 lb 12.8 oz (135.1 kg)   LMP 07/08/2016   BMI 51.12 kg/m   Imaging on 08/14/16  CLINICAL DATA:  Pregnant, spotting, left lower quadrant pain, beta HCG 4251  EXAM: OBSTETRIC <14 WK [place] AND TRANSVAGINAL OB US  TECHNIQUE: Both transabdominal and transvaginal ultrasound examinations were performed for complete evaluation of the gestation as well as the maternal uterus, adnexal regions, and pelvic cul-de-sac. Transvaginal technique was performed to assess early pregnancy.  COMPARISON:  None.  FINDINGS: Intrauterine gestational sac: Single  Yolk sac:  Not Visualized.  Embryo:  Not Visualized.  MSD: 6.1  mm   5 w   2  d  Subchorionic hemorrhage:  None visualized.  Maternal uterus/adnexae: Bilateral ovaries are not discretely visualized.  No free fluid.  IMPRESSION: Single intrauterine gestation, measuring 5 weeks 2 days by mean sac diameter.  Consider follow-up pelvic ultrasound in 14 days to confirm viability, as clinically warranted.  Electronically Signed   By: Charline Bills M.D.   On: 08/14/2016 17:03  Negative for CVA tenderness.   Assessment:      Bleeding in early pregnancy      Plan:    hCG today, return as scheduled for viability and dating u/s   Urine culture sent. Warm heat pack to back and tylenol for back discomfort. Abortion precautions reviewed. Warning signs and symptoms for when to go to ED discussed.  Doreene Burke, CNM

## 2016-08-18 ENCOUNTER — Telehealth: Payer: Self-pay | Admitting: Certified Nurse Midwife

## 2016-08-18 NOTE — Telephone Encounter (Signed)
Patient wants call with results as soon as available

## 2016-08-19 ENCOUNTER — Other Ambulatory Visit: Payer: Self-pay | Admitting: *Deleted

## 2016-08-19 DIAGNOSIS — Z3A01 Less than 8 weeks gestation of pregnancy: Secondary | ICD-10-CM

## 2016-08-19 LAB — BETA HCG QUANT (REF LAB): hCG Quant: 8173 m[IU]/mL

## 2016-08-20 ENCOUNTER — Encounter: Payer: Self-pay | Admitting: Certified Nurse Midwife

## 2016-08-23 ENCOUNTER — Encounter: Payer: Self-pay | Admitting: Certified Nurse Midwife

## 2016-08-23 ENCOUNTER — Other Ambulatory Visit: Payer: Self-pay | Admitting: *Deleted

## 2016-08-23 MED ORDER — ONDANSETRON 4 MG PO TBDP: 4.0000 mg | ORAL_TABLET | Freq: Three times a day (TID) | ORAL | 2 refills | Status: DC | PRN

## 2016-08-31 ENCOUNTER — Other Ambulatory Visit: Payer: Self-pay | Admitting: Certified Nurse Midwife

## 2016-08-31 ENCOUNTER — Ambulatory Visit (INDEPENDENT_AMBULATORY_CARE_PROVIDER_SITE_OTHER): Payer: BLUE CROSS/BLUE SHIELD

## 2016-08-31 DIAGNOSIS — N926 Irregular menstruation, unspecified: Secondary | ICD-10-CM

## 2016-09-07 ENCOUNTER — Other Ambulatory Visit: Payer: BLUE CROSS/BLUE SHIELD

## 2016-10-05 ENCOUNTER — Encounter: Payer: Self-pay | Admitting: Emergency Medicine

## 2016-10-05 ENCOUNTER — Emergency Department
Admission: EM | Admit: 2016-10-05 | Discharge: 2016-10-05 | Disposition: A | Discharge status: Home / Self Care | Payer: BLUE CROSS/BLUE SHIELD | Attending: Student in an Organized Health Care Education/Training Program | Admitting: Student in an Organized Health Care Education/Training Program

## 2016-10-05 DIAGNOSIS — J00 Acute nasopharyngitis [common cold]: Secondary | ICD-10-CM | POA: Diagnosis not present

## 2016-10-05 DIAGNOSIS — R059 Cough, unspecified: Secondary | ICD-10-CM

## 2016-10-05 DIAGNOSIS — R05 Cough: Secondary | ICD-10-CM

## 2016-10-05 DIAGNOSIS — Z87891 Personal history of nicotine dependence: Secondary | ICD-10-CM | POA: Insufficient documentation

## 2016-10-05 DIAGNOSIS — R0981 Nasal congestion: Secondary | ICD-10-CM | POA: Diagnosis present

## 2016-10-05 MED ORDER — AZITHROMYCIN 250 MG PO TABS: ORAL_TABLET | ORAL | 0 refills | Status: AC

## 2016-10-05 MED ORDER — BENZONATATE 100 MG PO CAPS: 100.0000 mg | ORAL_CAPSULE | Freq: Three times a day (TID) | ORAL | 0 refills | Status: DC | PRN

## 2016-10-05 NOTE — Discharge Instructions (Signed)
Take over-the-counter cough and cold to treat congestion symptoms. Utilize saline nasal spray as needed.

## 2016-10-05 NOTE — ED Triage Notes (Signed)
Patient ambulatory to triage with steady gait, without difficulty or distress noted, mask in place; pt st since mother's day having sinus drainage, ears stopped up, prod cough green sputum; taking OTC meds without relief

## 2016-10-05 NOTE — ED Provider Notes (Signed)
[facility] Emergency Department Provider Note   ____________________________________________   I have reviewed the triage vital signs and the nursing notes.   HISTORY  Chief Complaint Nasal Congestion and Cough    HPI Sara Boyd is a 26 y.o. female presents with sore throat, rhinorrhea, cough, congestion, ear pressure and sense of ear feeling stopped up for 1 week. Patient endorses a sick contact, her child is recovering from the croup. Patient denies fever, chills, headache, shortness of breath, nausea or vomiting. Patient denies history of asthma or pneumonia.  History reviewed. No pertinent past medical history.  Patient Active Problem List   Diagnosis Date Noted  . Vaginal bleeding in pregnancy, first trimester 08/17/2016  . History of preterm delivery, currently pregnant in first trimester 08/10/2016    Past Surgical History:  Procedure Laterality Date  . BLADDER SURGERY    . HERNIA REPAIR    . TONSILLECTOMY      Prior to Admission medications   Medication Sig Start Date End Date Taking? Authorizing Provider  azithromycin (ZITHROMAX Z-PAK) 250 MG tablet Take 2 tablets (500 mg) on  Day 1,  followed by 1 tablet (250 mg) once daily on Days 2 through 5. 10/05/16 10/10/16  Dionne Knoop M, PA-C  benzonatate (TESSALON PERLES) 100 MG capsule Take 1 capsule (100 mg total) by mouth 3 (three) times daily as needed for cough. 10/05/16 10/05/17  Trevontae Lindahl M, PA-C  Doxylamine-Pyridoxine (DICLEGIS) 10-10 MG TBEC Take 2 tablets by mouth at bedtime. If symptoms persist, add one tablet in the morning and one in the afternoon 08/10/16   Lawhorn, Vanessa DurhamJenkins Michelle, CNM  ondansetron (ZOFRAN ODT) 4 MG disintegrating tablet Take 1 tablet (4 mg total) by mouth every 8 (eight) hours as needed for nausea or vomiting. 08/23/16   Gunnar BullaLawhorn, Jenkins Michelle, CNM    Allergies Pineapple; Sulfa antibiotics; Latex; Vicodin [hydrocodone-acetaminophen]; Codeine; and  Penicillins  No family history on file.  Social History Social History  Substance Use Topics  . Smoking status: Former Games developermoker  . Smokeless tobacco: Never Used  . Alcohol use No    Review of Systems Constitutional: Negative for fever/chills Eyes: No visual changes. ENT: Positive for sore throat. Rhinorrhea. Cardiovascular: Denies chest pain. Respiratory: Productive cough cough. Denies shortness of breath. Gastrointestinal: No abdominal pain.  No nausea, vomiting, diarrhea. Neurological: Negative for headaches.  ____________________________________________   PHYSICAL EXAM:  VITAL SIGNS: ED Triage Vitals  Enc Vitals Group     BP 10/05/16 2222 112/76     Pulse Rate 10/05/16 2222 80     Resp 10/05/16 2222 18     Temp 10/05/16 2222 98.3 F (36.8 C)     Temp Source 10/05/16 2222 Oral     SpO2 10/05/16 2222 99 %     Weight 10/05/16 2223 289 lb (131.1 kg)     Height 10/05/16 2223 5\' 4"  (1.626 m)     Head Circumference --      Peak Flow --      Pain Score --      Pain Loc --      Pain Edu? --      Excl. in GC? --     Constitutional: Alert and oriented. Well appearing and in no acute distress.  Head: Normocephalic and atraumatic. Eyes: Conjunctivae are normal. Ears: Auditory canals are mildly erythematous, right canal swollen.. TMs intact bilaterally. Nose: Congestion and rhinorrhea. Mouth/Throat: Mucous membranes are moist. Oropharynx normal  Neck: Supple. Hematological/Lymphatic/Immunological: No cervical lymphadenopathy. Cardiovascular:  Normal rate, regular rhythm. Normal distal pulses. Respiratory: Normal respiratory effort. No wheezes/rales/rhonchi. Lungs CTAB Gastrointestinal: Soft and nontender.  Skin:  Skin is warm, dry and intact. No rash noted. Psychiatric: Mood and affect are normal.  ____________________________________________   LABS (all labs ordered are listed, but only abnormal results are displayed)  Labs Reviewed - No data to  display ____________________________________________  EKG None ____________________________________________  RADIOLOGY None ____________________________________________   PROCEDURES  Procedure(s) performed: no    Critical Care performed: no ____________________________________________   INITIAL IMPRESSION / ASSESSMENT AND PLAN / ED COURSE  Pertinent labs & imaging results that were available during my care of the patient were reviewed by me and considered in my medical decision making (see chart for details).  Patient presents to the emergency department sore throat, cough, congestion, rhinorrhea, ear pressure and fever. Symptoms are consistent with acute nasopharyngitis. Patient will be prescribed azithromycin and recommend continuing over-the-counter cough and cold for symptoms management. Encouraged patient to utilize saline nasal spray to moisturize nares. Patient also will be prescribed Tessalon Perles as needed for cough. Physical exam is reassuring at this time. All patient questions were answered. Patient  informed of clinical course, understand medical decision-making process, and agree with plan.  Patient was advised to follow up with PCP as needed and was also advised to return to the emergency department for symptoms that change or worsen.     _______   FINAL CLINICAL IMPRESSION(S) / ED DIAGNOSES  Final diagnoses:  Acute nasopharyngitis  Cough       NEW MEDICATIONS STARTED DURING THIS VISIT:  New Prescriptions   AZITHROMYCIN (ZITHROMAX Z-PAK) 250 MG TABLET    Take 2 tablets (500 mg) on  Day 1,  followed by 1 tablet (250 mg) once daily on Days 2 through 5.   BENZONATATE (TESSALON PERLES) 100 MG CAPSULE    Take 1 capsule (100 mg total) by mouth 3 (three) times daily as needed for cough.     Note:  This document was prepared using Dragon voice recognition software and may include unintentional dictation errors.    Keevin Panebianco, Karl Pock 10/05/16  2320    Willy Eddy, MD 10/05/16 [phone]

## 2016-10-20 ENCOUNTER — Emergency Department: Payer: BLUE CROSS/BLUE SHIELD

## 2016-10-20 ENCOUNTER — Encounter: Payer: Self-pay | Admitting: *Deleted

## 2016-10-20 ENCOUNTER — Observation Stay
Admission: EM | Admit: 2016-10-20 | Discharge: 2016-10-22 | Disposition: A | Discharge status: Home / Self Care | Payer: BLUE CROSS/BLUE SHIELD | Attending: Obstetrics & Gynecology | Admitting: Obstetrics & Gynecology

## 2016-10-20 DIAGNOSIS — O045 Genital tract and pelvic infection following (induced) termination of pregnancy: Principal | ICD-10-CM | POA: Insufficient documentation

## 2016-10-20 DIAGNOSIS — Z91040 Latex allergy status: Secondary | ICD-10-CM | POA: Diagnosis not present

## 2016-10-20 DIAGNOSIS — Z87891 Personal history of nicotine dependence: Secondary | ICD-10-CM | POA: Insufficient documentation

## 2016-10-20 DIAGNOSIS — Z91018 Allergy to other foods: Secondary | ICD-10-CM | POA: Insufficient documentation

## 2016-10-20 DIAGNOSIS — Z880 Allergy status to penicillin: Secondary | ICD-10-CM | POA: Insufficient documentation

## 2016-10-20 DIAGNOSIS — Z882 Allergy status to sulfonamides status: Secondary | ICD-10-CM | POA: Diagnosis not present

## 2016-10-20 DIAGNOSIS — N939 Abnormal uterine and vaginal bleeding, unspecified: Secondary | ICD-10-CM | POA: Diagnosis present

## 2016-10-20 DIAGNOSIS — O034 Incomplete spontaneous abortion without complication: Secondary | ICD-10-CM

## 2016-10-20 DIAGNOSIS — Z885 Allergy status to narcotic agent status: Secondary | ICD-10-CM | POA: Diagnosis not present

## 2016-10-20 DIAGNOSIS — N719 Inflammatory disease of uterus, unspecified: Secondary | ICD-10-CM | POA: Diagnosis present

## 2016-10-20 DIAGNOSIS — N710 Acute inflammatory disease of uterus: Secondary | ICD-10-CM

## 2016-10-20 HISTORY — DX: Other specified health status: Z78.9

## 2016-10-20 LAB — COMPREHENSIVE METABOLIC PANEL
ALK PHOS: 64 U/L (ref 38–126)
ALT: 14 U/L (ref 14–54)
ANION GAP: 8 (ref 5–15)
AST: 17 U/L (ref 15–41)
Albumin: 4 g/dL (ref 3.5–5.0)
BILIRUBIN TOTAL: 1.2 mg/dL (ref 0.3–1.2)
BUN: 9 mg/dL (ref 6–20)
CALCIUM: 9.4 mg/dL (ref 8.9–10.3)
CO2: 19 mmol/L — AB (ref 22–32)
CREATININE: 0.97 mg/dL (ref 0.44–1.00)
Chloride: 108 mmol/L (ref 101–111)
Glucose, Bld: 140 mg/dL — ABNORMAL HIGH (ref 65–99)
Potassium: 3.3 mmol/L — ABNORMAL LOW (ref 3.5–5.1)
Sodium: 135 mmol/L (ref 135–145)
TOTAL PROTEIN: 7.6 g/dL (ref 6.5–8.1)

## 2016-10-20 LAB — CBC WITH DIFFERENTIAL/PLATELET
Basophils Absolute: 0 10*3/uL (ref 0–0.1)
Basophils Relative: 0 %
EOS ABS: 0 10*3/uL (ref 0–0.7)
Eosinophils Relative: 1 %
HEMATOCRIT: 40 % (ref 35.0–47.0)
HEMOGLOBIN: 13.5 g/dL (ref 12.0–16.0)
LYMPHS ABS: 0.9 10*3/uL — AB (ref 1.0–3.6)
LYMPHS PCT: 10 %
MCH: 29.2 pg (ref 26.0–34.0)
MCHC: 33.8 g/dL (ref 32.0–36.0)
MCV: 86.5 fL (ref 80.0–100.0)
MONOS PCT: 6 %
Monocytes Absolute: 0.6 10*3/uL (ref 0.2–0.9)
Neutro Abs: 7.6 10*3/uL — ABNORMAL HIGH (ref 1.4–6.5)
Neutrophils Relative %: 83 %
Platelets: 225 10*3/uL (ref 150–440)
RBC: 4.63 MIL/uL (ref 3.80–5.20)
RDW: 13.8 % (ref 11.5–14.5)
WBC: 9.2 10*3/uL (ref 3.6–11.0)

## 2016-10-20 LAB — LIPASE, BLOOD: LIPASE: 19 U/L (ref 11–51)

## 2016-10-20 LAB — URINALYSIS, ROUTINE W REFLEX MICROSCOPIC
SQUAMOUS EPITHELIAL / LPF: NONE SEEN
Specific Gravity, Urine: 1.023 (ref 1.005–1.030)

## 2016-10-20 LAB — LACTIC ACID, PLASMA: LACTIC ACID, VENOUS: 1 mmol/L (ref 0.5–1.9)

## 2016-10-20 LAB — PROTIME-INR
INR: 1.02
PROTHROMBIN TIME: 13.4 s (ref 11.4–15.2)

## 2016-10-20 LAB — HCG, QUANTITATIVE, PREGNANCY: HCG, BETA CHAIN, QUANT, S: 65 m[IU]/mL — AB (ref <5)

## 2016-10-20 LAB — POCT PREGNANCY, URINE: Preg Test, Ur: POSITIVE — AB

## 2016-10-20 MED ORDER — LEVOFLOXACIN IN D5W 750 MG/150ML IV SOLN
750.0000 mg | INTRAVENOUS | Status: DC
  Filled 2016-10-20: qty 150

## 2016-10-20 MED ORDER — METRONIDAZOLE IN NACL 5-0.79 MG/ML-% IV SOLN
500.0000 mg | Freq: Once | INTRAVENOUS | Status: AC
  Administered 2016-10-21: 500 mg via INTRAVENOUS

## 2016-10-20 MED ORDER — METRONIDAZOLE IN NACL 5-0.79 MG/ML-% IV SOLN
500.0000 mg | Freq: Three times a day (TID) | INTRAVENOUS | Status: DC
  Filled 2016-10-20 (×3): qty 100

## 2016-10-20 MED ORDER — ONDANSETRON HCL 4 MG/2ML IJ SOLN
4.0000 mg | Freq: Once | INTRAMUSCULAR | Status: AC
  Administered 2016-10-20: 4 mg via INTRAVENOUS

## 2016-10-20 MED ORDER — LEVOFLOXACIN IN D5W 750 MG/150ML IV SOLN
750.0000 mg | Freq: Once | INTRAVENOUS | Status: AC
  Administered 2016-10-20: 750 mg via INTRAVENOUS

## 2016-10-20 MED ORDER — SODIUM CHLORIDE 0.9 % IV BOLUS (SEPSIS)
1000.0000 mL | INTRAVENOUS | Status: AC
  Administered 2016-10-20: 1000 mL via INTRAVENOUS

## 2016-10-20 NOTE — ED Triage Notes (Signed)
Pt had an abortion 1 month ago   Pt began with vaginal bleeding 1 week ago and continues to have heavy bleeding with dizziness.  Pt alert.

## 2016-10-20 NOTE — ED Notes (Signed)
Pt given pads and underwear to clean up. Pt states this is heavy bleeding compared to her previous periods in the past

## 2016-10-20 NOTE — ED Provider Notes (Signed)
[facility] Emergency Department Provider Note  ____________________________________________   First MD Initiated Contact with Patient 10/20/16 2231     (approximate)  I have reviewed the triage vital signs and the nursing notes.   HISTORY  Chief Complaint Vaginal Bleeding    HPI Sara Boyd is a 26 y.o. female who presents for evaluation of heavy vaginal bleeding and fever and abdominal pain in the setting of an elective abortion performed about a month ago.  She [facility]  Afterwards she seemed to be recovering fine and had a Nexplanon placed at the same time as the abortion.  She started having vaginal bleeding about 5 days ago this started out light but has become heavy with large clots and frequent need to change her pad.  Additionally she started having subjective fevers and then as of last night she had a fever as high as 103 with severe lower abdominal pain all over her abdomen.  Using a heating pad makes it feel better, everything else makes it feel worse.  She has had some aching chills.  His had nausea but no vomiting.  She denies chest pain or shortness of breath.  Her symptoms are severe.  She has not been on antibiotics recently.  No past medical history on file.  Patient Active Problem List   Diagnosis Date Noted  . Vaginal bleeding in pregnancy, first trimester 08/17/2016  . History of preterm delivery, currently pregnant in first trimester 08/10/2016    Past Surgical History:  Procedure Laterality Date  . BLADDER SURGERY    . HERNIA REPAIR    . TONSILLECTOMY      Prior to Admission medications   Medication Sig Start Date End Date Taking? Authorizing Provider  benzonatate (TESSALON PERLES) 100 MG capsule Take 1 capsule (100 mg total) by mouth 3 (three) times daily as needed for cough. 10/05/16 10/05/17  Little, Traci M, PA-C  Doxylamine-Pyridoxine (DICLEGIS) 10-10 MG TBEC  Take 2 tablets by mouth at bedtime. If symptoms persist, add one tablet in the morning and one in the afternoon 08/10/16   Lawhorn, Vanessa DurhamJenkins Michelle, CNM  ondansetron (ZOFRAN ODT) 4 MG disintegrating tablet Take 1 tablet (4 mg total) by mouth every 8 (eight) hours as needed for nausea or vomiting. 08/23/16   Gunnar BullaLawhorn, Jenkins Michelle, CNM    Allergies Pineapple; Sulfa antibiotics; Latex; Vicodin [hydrocodone-acetaminophen]; Codeine; and Penicillins  No family history on file.  Social History Social History  Substance Use Topics  . Smoking status: Former Games developermoker  . Smokeless tobacco: Never Used  . Alcohol use No    Review of Systems Constitutional: +fever/chills, Tmax 103 Eyes: No visual changes. ENT: No sore throat. Cardiovascular: Denies chest pain. Respiratory: Denies shortness of breath. Gastrointestinal: Lower abdominal pain.  Nausea, no vomiting.  No diarrhea.  No constipation. Genitourinary: Negative for dysuria. Heavy vaginal bleeding with large clots Musculoskeletal: Negative for neck pain.  Negative for back pain. Integumentary: Negative for rash. Neurological: Negative for headaches, focal weakness or numbness.   ____________________________________________   PHYSICAL EXAM:  VITAL SIGNS: ED Triage Vitals  Enc Vitals Group     BP 10/20/16 2208 (!) 144/81     Pulse Rate 10/20/16 2208 (!) 136     Resp 10/20/16 2208 (!) 22     Temp 10/20/16 2208 (!) 100.6 F (38.1 C)     Temp Source 10/20/16 2208 Oral     SpO2 10/20/16 2208 100 %  Weight 10/20/16 2205 127 kg (280 lb)     Height 10/20/16 2205 1.626 m (5\' 4" )     Head Circumference --      Peak Flow --      Pain Score 10/20/16 2204 10     Pain Loc --      Pain Edu? --      Excl. in GC? --     Constitutional: Alert and oriented. Appears uncomfortable but non-toxic Eyes: Conjunctivae are normal.  Head: Atraumatic. Neck: No stridor.  No meningeal signs.   Cardiovascular: Normal rate, regular rhythm. Good  peripheral circulation. Grossly normal heart sounds. Respiratory: Normal respiratory effort.  No retractions. Lungs CTAB. Gastrointestinal: Soft and nondistended but with moderate to severe generalized tenderness to palpation throughout. GU:  deferred in favor of urgent ultrasound Musculoskeletal: No lower extremity tenderness nor edema. No gross deformities of extremities. Neurologic:  Normal speech and language. No gross focal neurologic deficits are appreciated.  Skin:  Skin is warm, dry and intact. No rash noted. Psychiatric: Mood and affect are normal. Speech and behavior are normal.  ____________________________________________   LABS (all labs ordered are listed, but only abnormal results are displayed)  Labs Reviewed  CBC WITH DIFFERENTIAL/PLATELET - Abnormal; Notable for the following:       Result Value   Neutro Abs 7.6 (*)    Lymphs Abs 0.9 (*)    All other components within normal limits  COMPREHENSIVE METABOLIC PANEL - Abnormal; Notable for the following:    Potassium 3.3 (*)    CO2 19 (*)    Glucose, Bld 140 (*)    All other components within normal limits  HCG, QUANTITATIVE, PREGNANCY - Abnormal; Notable for the following:    hCG, Beta Chain, Quant, S 65 (*)    All other components within normal limits  URINALYSIS, ROUTINE W REFLEX MICROSCOPIC - Abnormal; Notable for the following:    Color, Urine RED (*)    APPearance CLOUDY (*)    Glucose, UA   (*)    Value: TEST NOT REPORTED DUE TO COLOR INTERFERENCE OF URINE PIGMENT   Hgb urine dipstick   (*)    Value: TEST NOT REPORTED DUE TO COLOR INTERFERENCE OF URINE PIGMENT   Bilirubin Urine   (*)    Value: TEST NOT REPORTED DUE TO COLOR INTERFERENCE OF URINE PIGMENT   Ketones, ur   (*)    Value: TEST NOT REPORTED DUE TO COLOR INTERFERENCE OF URINE PIGMENT   Protein, ur   (*)    Value: TEST NOT REPORTED DUE TO COLOR INTERFERENCE OF URINE PIGMENT   Nitrite   (*)    Value: TEST NOT REPORTED DUE TO COLOR INTERFERENCE OF  URINE PIGMENT   Leukocytes, UA   (*)    Value: TEST NOT REPORTED DUE TO COLOR INTERFERENCE OF URINE PIGMENT   Bacteria, UA MANY (*)    All other components within normal limits  POCT PREGNANCY, URINE - Abnormal; Notable for the following:    Preg Test, Ur POSITIVE (*)    All other components within normal limits  CULTURE, BLOOD (ROUTINE X 2)  CULTURE, BLOOD (ROUTINE X 2)  URINE CULTURE  LIPASE, BLOOD  LACTIC ACID, PLASMA  PROTIME-INR  PROCALCITONIN  POC URINE PREG, ED   ____________________________________________  EKG  None - EKG not ordered by ED physician ____________________________________________  RADIOLOGY   No results found.  ____________________________________________   PROCEDURES  Critical Care performed: Yes, see critical care procedure note(s)   Procedure(s)  performed:   .Critical Care Performed by: Loleta Rose Authorized by: Loleta Rose   Critical care provider statement:    Critical care time (minutes):  30   Critical care time was exclusive of:  Separately billable procedures and treating other patients   Critical care was necessary to treat or prevent imminent or life-threatening deterioration of the following conditions:  Sepsis   Critical care was time spent personally by me on the following activities:  Development of treatment plan with patient or surrogate, discussions with consultants, evaluation of patient's response to treatment, examination of patient, obtaining history from patient or surrogate, ordering and performing treatments and interventions, ordering and review of laboratory studies, ordering and review of radiographic studies, pulse oximetry, re-evaluation of patient's condition and review of old charts      ____________________________________________   INITIAL IMPRESSION / ASSESSMENT AND PLAN / ED COURSE  Pertinent labs & imaging results that were available during my care of the patient were reviewed by me and  considered in my medical decision making (see chart for details).  The patient meets criteria for sepsis upon her initial arrival with substantial tachycardia and fever which was as high as 103 at home but is still 100.6 here in the emergency department.  She has diffuse and severe tenderness throughout her abdomen but I am concerned about endometritis and retained products although I do not understand why this would have taken treated for weeks to develop.  Because her abdomen is not firm/peritoneal, I feel that a transvaginal ultrasound to carefully evaluate the uterus is the optimal study at this time.  I am performing a full septic workup including empiric antibiotics for intra-abdominal infection (penicillin allergic alternative) and symmetric.  Fluids, although I will wait to see if she meets criteria for severe sepsis before giving 30 mi./kg.  The patient and her mother are in agreement with the plan.  I think that it is most important that we get the imaging as soon as possible so I am holding off on a pelvic exam for now until imaging she has been performed.  Clinical Course as of Jun 14 0000  Wed Oct 20, 2016  2348 Delay documentation due to multiple critically ill patients in the ED.  The patient has a very slightly positive hCG of 65 and her urine point-of-care pregnancy test also looked slightly positive (although the line showed up as positive it was very faint).  Surprisingly her lactic acid is normal and she does not have a leukocytosis which is reassuring and after getting her liter of fluids her heart rate is down to about 100.  I will reassess soon as possible after the ultrasound.  [CF]  2358 Transferring ED care to Dr. Manson Passey to follow up ultrasound, pelvic if necessary, or defer to OB/GYN.  [CF]    Clinical Course User Index [CF] Loleta Rose, MD    ____________________________________________  FINAL CLINICAL IMPRESSION(S) / ED DIAGNOSES  Final diagnoses:  None      MEDICATIONS GIVEN DURING THIS VISIT:  Medications  levofloxacin (LEVAQUIN) IVPB 750 mg (750 mg Intravenous New Bag/Given 10/20/16 2308)  metroNIDAZOLE (FLAGYL) IVPB 500 mg (not administered)  levofloxacin (LEVAQUIN) IVPB 750 mg (not administered)  metroNIDAZOLE (FLAGYL) IVPB 500 mg (not administered)  sodium chloride 0.9 % bolus 1,000 mL (1,000 mLs Intravenous New Bag/Given 10/20/16 2258)  ondansetron (ZOFRAN) injection 4 mg (4 mg Intravenous Given 10/20/16 2326)     NEW OUTPATIENT MEDICATIONS STARTED DURING THIS VISIT:  New Prescriptions  No medications on file    Modified Medications   No medications on file    Discontinued Medications   No medications on file     Note:  This document was prepared using Dragon voice recognition software and may include unintentional dictation errors.    Loleta Rose, MD 10/21/16 0000

## 2016-10-20 NOTE — Progress Notes (Signed)
Pharmacy Antibiotic Note  Sara Boyd is a 26 y.o. female admitted on 10/20/2016 with intra-abdominal infection.  Pharmacy has been consulted for levofloxacin and metronidazole dosing.  Plan: Levaquin 750 mg q 24 hours ordered. Metronidazole 500 mg q 8 hours ordered.  Height: 5\' 4"  (162.6 cm) Weight: 280 lb (127 kg) IBW/kg (Calculated) : 54.7  Temp (24hrs), Avg:100.6 F (38.1 C), Min:100.6 F (38.1 C), Max:100.6 F (38.1 C)   Recent Labs Lab 10/20/16 2245  WBC 9.2  CREATININE 0.97    Estimated Creatinine Clearance: 116 mL/min (by C-G formula based on SCr of 0.97 mg/dL).    Allergies  Allergen Reactions  . Pineapple Shortness Of Breath    Pt states her throat shuts completely. Anything pineapple, pt states it is juice, the whole fruit, or flavoring.   . Sulfa Antibiotics Shortness Of Breath    Pt states her throat closes as well, tightness.   . Latex Itching    Pt states her throat also starts closing.   . Vicodin [Hydrocodone-Acetaminophen] Other (See Comments)    Hallucinations.   . Codeine Rash    Pt states it feels like her skin is coming off.   . Penicillins Rash    Pt states it feels like her skin is coming off.     Antimicrobials this admission: levofloxacin metronidazole 6/13 >>    >>   Dose adjustments this admission:   Microbiology results: 6/13 BCx: pending    Thank you for allowing pharmacy to be a part of this patient's care.  Aava Deland S 10/20/2016 11:25 PM

## 2016-10-21 DIAGNOSIS — N939 Abnormal uterine and vaginal bleeding, unspecified: Secondary | ICD-10-CM | POA: Diagnosis not present

## 2016-10-21 DIAGNOSIS — O045 Genital tract and pelvic infection following (induced) termination of pregnancy: Secondary | ICD-10-CM | POA: Diagnosis not present

## 2016-10-21 DIAGNOSIS — N719 Inflammatory disease of uterus, unspecified: Secondary | ICD-10-CM | POA: Diagnosis present

## 2016-10-21 LAB — CBC
HEMATOCRIT: 34.3 % — AB (ref 35.0–47.0)
Hemoglobin: 11.8 g/dL — ABNORMAL LOW (ref 12.0–16.0)
MCH: 29.4 pg (ref 26.0–34.0)
MCHC: 34.3 g/dL (ref 32.0–36.0)
MCV: 85.5 fL (ref 80.0–100.0)
Platelets: 215 10*3/uL (ref 150–440)
RBC: 4.01 MIL/uL (ref 3.80–5.20)
RDW: 13.6 % (ref 11.5–14.5)
WBC: 7.6 10*3/uL (ref 3.6–11.0)

## 2016-10-21 LAB — PROCALCITONIN

## 2016-10-21 LAB — CHLAMYDIA/NGC RT PCR (ARMC ONLY)
CHLAMYDIA TR: NOT DETECTED
N gonorrhoeae: NOT DETECTED

## 2016-10-21 LAB — HCG, QUANTITATIVE, PREGNANCY: hCG, Beta Chain, Quant, S: 34 m[IU]/mL — ABNORMAL HIGH (ref <5)

## 2016-10-21 MED ORDER — GENTAMICIN SULFATE 40 MG/ML IJ SOLN
2.0000 mg/kg | Freq: Once | INTRAMUSCULAR | Status: AC
  Administered 2016-10-21: 250 mg via INTRAVENOUS
  Filled 2016-10-21: qty 6.25

## 2016-10-21 MED ORDER — ONDANSETRON HCL 4 MG/2ML IJ SOLN: 4.0000 mg | Freq: Four times a day (QID) | INTRAMUSCULAR | Status: DC | PRN

## 2016-10-21 MED ORDER — OXYCODONE-ACETAMINOPHEN 5-325 MG PO TABS: 1.0000 | ORAL_TABLET | ORAL | Status: DC | PRN

## 2016-10-21 MED ORDER — METHYLERGONOVINE MALEATE 0.2 MG PO TABS
0.2000 mg | ORAL_TABLET | Freq: Four times a day (QID) | ORAL | Status: DC
  Filled 2016-10-21 (×2): qty 1

## 2016-10-21 MED ORDER — MORPHINE SULFATE (PF) 2 MG/ML IV SOLN: 1.0000 mg | INTRAVENOUS | Status: DC | PRN

## 2016-10-21 MED ORDER — CLINDAMYCIN PHOSPHATE 900 MG/50ML IV SOLN
900.0000 mg | Freq: Once | INTRAVENOUS | Status: AC
  Administered 2016-10-21: 900 mg via INTRAVENOUS
  Filled 2016-10-21: qty 50

## 2016-10-21 MED ORDER — PRENATAL MULTIVITAMIN CH
1.0000 | ORAL_TABLET | Freq: Every day | ORAL | Status: DC
  Administered 2016-10-21: 1 via ORAL
  Filled 2016-10-21 (×2): qty 1

## 2016-10-21 MED ORDER — LACTATED RINGERS IV SOLN
INTRAVENOUS | Status: DC
  Administered 2016-10-21 – 2016-10-22 (×4): via INTRAVENOUS

## 2016-10-21 MED ORDER — ACETAMINOPHEN 325 MG PO TABS
650.0000 mg | ORAL_TABLET | ORAL | Status: DC | PRN
  Administered 2016-10-21 – 2016-10-22 (×2): 650 mg via ORAL
  Filled 2016-10-21: qty 2

## 2016-10-21 MED ORDER — GENTAMICIN SULFATE 40 MG/ML IJ SOLN
1.5000 mg/kg | Freq: Three times a day (TID) | INTRAVENOUS | Status: DC
  Administered 2016-10-21 – 2016-10-22 (×2): 190 mg via INTRAVENOUS
  Filled 2016-10-21 (×5): qty 4.75

## 2016-10-21 MED ORDER — ONDANSETRON HCL 4 MG PO TABS: 4.0000 mg | ORAL_TABLET | Freq: Four times a day (QID) | ORAL | Status: DC | PRN

## 2016-10-21 NOTE — ED Notes (Signed)
Patient transported to Ultrasound 

## 2016-10-21 NOTE — ED Notes (Signed)
Dr. Tiburcio PeaHarris here to assess pt, is requesting pelvic bed which is being looked for at this time.

## 2016-10-21 NOTE — H&P (Signed)
Obstetrics & Gynecology History and Physical Note  Date of Consultation: 10/21/2016   Requesting Provider: [facility]  Primary OBGYN: [facility] Primary Care Provider: Evelene Croon  Reason for Consultation: bleeding and fever after EAb 4 weeks ago  History of Present Illness: Sara Boyd is a 27 y.o. [id number] (Patient's last menstrual period was 05/22/2016 (approximate).), with the above CC. Elective termination 4 weeks ago, did fine until last night with fever, sweats, pain, and heavy bleeding with clots.  [place] suggests thickening in LUS.  WBC stable.    ROS: A review of systems was performed and negative, except as stated in the above HPI.  OBGYN History: As per HPI. OB History    Gravida Para Term Preterm AB Living   3 2 1 1  0 2   SAB TAB Ectopic Multiple Live Births   0 0 0 0        Obstetric Comments   Pt water ruptured @ 19 weeks, bed rest, baby with CP       Past Medical History: No past medical history on file.  Past Surgical History: Past Surgical History:  Procedure Laterality Date  . BLADDER SURGERY    . HERNIA REPAIR    . TONSILLECTOMY      Family History:  No family history on file. She denies any female cancers, bleeding or blood clotting disorders.   Social History:  Social History   Social History  . Marital status: Married    Spouse name: N/A  . Number of children: N/A  . Years of education: N/A   Occupational History  . Not on file.   Social History Main Topics  . Smoking status: Former Games developer  . Smokeless tobacco: Never Used  . Alcohol use No  . Drug use: No  . Sexual activity: Yes   Other Topics Concern  . Not on file   Social History Narrative  . No narrative on file    Allergy: Allergies  Allergen Reactions  . Pineapple Shortness Of Breath    Pt states her throat shuts completely. Anything pineapple, pt states it is juice, the whole fruit, or flavoring.   . Sulfa Antibiotics Shortness Of Breath    Pt states her throat  closes as well, tightness.   . Latex Itching    Pt states her throat also starts closing.   . Vicodin [Hydrocodone-Acetaminophen] Other (See Comments)    Hallucinations.   . Codeine Rash    Pt states it feels like her skin is coming off.   . Penicillins Rash    Pt states it feels like her skin is coming off.     Current Outpatient Medications:  (Not in a hospital admission)  Hospital Medications: Current Facility-Administered Medications  Medication Dose Route Frequency Provider Last Rate Last Dose  . lactated ringers infusion   Intravenous Continuous Nadara Mustard, MD      . levofloxacin (LEVAQUIN) IVPB 750 mg  750 mg Intravenous Q24H Loleta Rose, MD      . methylergonovine (METHERGINE) tablet 0.2 mg  0.2 mg Oral QID Nadara Mustard, MD      . metroNIDAZOLE (FLAGYL) IVPB 500 mg  500 mg Intravenous Tedra Coupe Loleta Rose, MD      . morphine 2 MG/ML injection 1-2 mg  1-2 mg Intravenous Q3H PRN Nadara Mustard, MD      . ondansetron [facility]) tablet 4 mg  4 mg Oral Q6H PRN Nadara Mustard, MD       Or  .  ondansetron (ZOFRAN) injection 4 mg  4 mg Intravenous Q6H PRN Nadara MustardHarris, Robert P, MD      . oxyCODONE-acetaminophen (PERCOCET/ROXICET) 5-325 MG per tablet 1-2 tablet  1-2 tablet Oral Q3H PRN Nadara MustardHarris, Robert P, MD      . prenatal multivitamin tablet 1 tablet  1 tablet Oral Q1200 Nadara MustardHarris, Robert P, MD       Current Outpatient Prescriptions  Medication Sig Dispense Refill  . benzonatate (TESSALON PERLES) 100 MG capsule Take 1 capsule (100 mg total) by mouth 3 (three) times daily as needed for cough. 20 capsule 0  . Doxylamine-Pyridoxine (DICLEGIS) 10-10 MG TBEC Take 2 tablets by mouth at bedtime. If symptoms persist, add one tablet in the morning and one in the afternoon 100 tablet 5  . ondansetron (ZOFRAN ODT) 4 MG disintegrating tablet Take 1 tablet (4 mg total) by mouth every 8 (eight) hours as needed for nausea or vomiting. 20 tablet 2    Physical Exam: Vitals:   10/21/16 0041  10/21/16 0100 10/21/16 0201 10/21/16 0204  BP:  113/87 127/80   Pulse:  94 (!) 112 92  Resp:  (!) 22 20 17   Temp: 98.7 F (37.1 C)     TempSrc: Oral     SpO2:  100% 99% 100%  Weight:      Height:        Temp:  [98.7 F (37.1 C)-100.6 F (38.1 C)] 98.7 F (37.1 C) (06/14 0041) Pulse Rate:  [85-136] 92 (06/14 0204) Resp:  [17-22] 17 (06/14 0204) BP: (102-144)/(67-87) 127/80 (06/14 0201) SpO2:  [99 %-100 %] 100 % (06/14 0204) Weight:  [280 lb (127 kg)] 280 lb (127 kg) (06/13 2205) No intake/output data recorded. Total I/O In: 1000 [IV Piggyback:1000] Out: -   Intake/Output Summary (Last 24 hours) at 10/21/16 0208 Last data filed at 10/21/16 0030  Gross per 24 hour  Intake             1000 ml  Output                0 ml  Net             1000 ml    Body mass index is 48.06 kg/m. Constitutional: Well nourished, well developed female in no acute distress.  HEENT: normal Neck:  Supple, normal appearance, and no thyromegaly  Cardiovascular:Regular rate and rhythm.  No murmurs, rubs or gallops. Respiratory:  Clear to auscultation bilateral. Normal respiratory effort Abdomen: positive bowel sounds and no masses, hernias; diffusely non tender to palpation, non distended Neuro: grossly intact Psych:  Normal mood and affect.  Skin:  Warm and dry.  MS: normal gait and normal bilateral lower extremity strength/ROM/symmetry Lymphatic:  No inguinal lymphadenopathy.   Pelvic exam: is not limited by body habitus EGBUS: within normal limits Vagina: within normal limits. Bladder and Urethra: normal. Cervix: clots at os; removed with forceps (clots and tissue) Uterus:  enlarged, 6 weeks size and soft Adnexa: not evaluated  Laboratory: Beta HCG: 68  Recent Labs Lab 10/20/16 2245  WBC 9.2  HGB 13.5  HCT 40.0  PLT 225    Recent Labs Lab 10/20/16 2245  NA 135  K 3.3*  CL 108  CO2 19*  BUN 9  CREATININE 0.97  CALCIUM 9.4  PROT 7.6  BILITOT 1.2  ALKPHOS 64  ALT 14   AST 17  GLUCOSE 140*    Recent Labs Lab 10/20/16 2245  INR 1.02   No results for input(s): ABORH in  the last 168 hours.  Imaging:  Ultrasound independently reviewed/interpreted by self.  Assessment: Sara Boyd is a 26 y.o. [phone] (Patient's last menstrual period was 05/22/2016 (approximate).) who presented to the ED with complaints of fever and pain and bleeding; findings are consistent with retained products after EAb.  Plan: Improved sx's after bedside exam and removal of tissue/clot with forceps Plan to observe overnight.  D&C if worsens again. Methergine and ABX after bedside procedure. IVF, pain meds NPO for now just in case  Annamarie Major, MD, [facility], [facility] 10/21/2016  2:08 AM Pager [phone]

## 2016-10-21 NOTE — Progress Notes (Signed)
Obstetric and Gynecology  Subjective  Doing well, no concerns.  Bleeding has subsided significantly, some soreness but no cramping.    Objective   Vitals:   10/21/16 0250 10/21/16 0734  BP: 124/64 (!) 106/53  Pulse: 88 78  Resp: (!) 22 20  Temp: 99.2 F (37.3 C) 99.2 F (37.3 C)     Intake/Output Summary (Last 24 hours) at 10/21/16 0831 Last data filed at 10/21/16 0600  Gross per 24 hour  Intake             1375 ml  Output              200 ml  Net             1175 ml    General: NAD Pulmonary: No increased work of breathing Abdomen: soft, non-tender, non-distended Extremities: no edema  Labs: Results for orders placed or performed during the hospital encounter of 10/20/16 (from the past 24 hour(s))  CBC with Differential/Platelet     Status: Abnormal   Collection Time: 10/20/16 10:45 PM  Result Value Ref Range   WBC 9.2 3.6 - 11.0 K/uL   RBC 4.63 3.80 - 5.20 MIL/uL   Hemoglobin 13.5 12.0 - 16.0 g/dL   HCT 83.0 15.9 - 96.8 %   MCV 86.5 80.0 - 100.0 fL   MCH 29.2 26.0 - 34.0 pg   MCHC 33.8 32.0 - 36.0 g/dL   RDW 95.7 02.2 - 02.6 %   Platelets 225 150 - 440 K/uL   Neutrophils Relative % 83 %   Neutro Abs 7.6 (H) 1.4 - 6.5 K/uL   Lymphocytes Relative 10 %   Lymphs Abs 0.9 (L) 1.0 - 3.6 K/uL   Monocytes Relative 6 %   Monocytes Absolute 0.6 0.2 - 0.9 K/uL   Eosinophils Relative 1 %   Eosinophils Absolute 0.0 0 - 0.7 K/uL   Basophils Relative 0 %   Basophils Absolute 0.0 0 - 0.1 K/uL  Comprehensive metabolic panel     Status: Abnormal   Collection Time: 10/20/16 10:45 PM  Result Value Ref Range   Sodium 135 135 - 145 mmol/L   Potassium 3.3 (L) 3.5 - 5.1 mmol/L   Chloride 108 101 - 111 mmol/L   CO2 19 (L) 22 - 32 mmol/L   Glucose, Bld 140 (H) 65 - 99 mg/dL   BUN 9 6 - 20 mg/dL   Creatinine, Ser 6.91 0.44 - 1.00 mg/dL   Calcium 9.4 8.9 - 67.5 mg/dL   Total Protein 7.6 6.5 - 8.1 g/dL   Albumin 4.0 3.5 - 5.0 g/dL   AST 17 15 - 41 U/L   ALT 14 14 - 54 U/L   Alkaline Phosphatase 64 38 - 126 U/L   Total Bilirubin 1.2 0.3 - 1.2 mg/dL   GFR calc non Af Amer >60 >60 mL/min   GFR calc Af Amer >60 >60 mL/min   Anion gap 8 5 - 15  Lipase, blood     Status: None   Collection Time: 10/20/16 10:45 PM  Result Value Ref Range   Lipase 19 11 - 51 U/L  hCG, quantitative, pregnancy     Status: Abnormal   Collection Time: 10/20/16 10:45 PM  Result Value Ref Range   hCG, Beta Chain, Quant, S 65 (H) <5 mIU/mL  Lactic acid, plasma     Status: None   Collection Time: 10/20/16 10:45 PM  Result Value Ref Range   Lactic Acid, Venous 1.0 0.5 - 1.9  mmol/L  Procalcitonin     Status: None   Collection Time: 10/20/16 10:45 PM  Result Value Ref Range   Procalcitonin <0.10 ng/mL  Protime-INR     Status: None   Collection Time: 10/20/16 10:45 PM  Result Value Ref Range   Prothrombin Time 13.4 11.4 - 15.2 seconds   INR 1.02   Blood Culture (routine x 2)     Status: None (Preliminary result)   Collection Time: 10/20/16 10:45 PM  Result Value Ref Range   Specimen Description BLOOD RAC    Special Requests      BOTTLES DRAWN AEROBIC AND ANAEROBIC Blood Culture adequate volume   Culture NO GROWTH < 12 HOURS    Report Status PENDING   Blood Culture (routine x 2)     Status: None (Preliminary result)   Collection Time: 10/20/16 10:47 PM  Result Value Ref Range   Specimen Description BLOOD BLH    Special Requests      BOTTLES DRAWN AEROBIC AND ANAEROBIC Blood Culture adequate volume   Culture NO GROWTH < 12 HOURS    Report Status PENDING   Urinalysis, Routine w reflex microscopic     Status: Abnormal   Collection Time: 10/20/16 11:05 PM  Result Value Ref Range   Color, Urine RED (A) YELLOW   APPearance CLOUDY (A) CLEAR   Specific Gravity, Urine 1.023 1.005 - 1.030   pH  5.0 - 8.0    TEST NOT REPORTED DUE TO COLOR INTERFERENCE OF URINE PIGMENT   Glucose, UA (A) NEGATIVE mg/dL    TEST NOT REPORTED DUE TO COLOR INTERFERENCE OF URINE PIGMENT   Hgb urine dipstick  (A) NEGATIVE    TEST NOT REPORTED DUE TO COLOR INTERFERENCE OF URINE PIGMENT   Bilirubin Urine (A) NEGATIVE    TEST NOT REPORTED DUE TO COLOR INTERFERENCE OF URINE PIGMENT   Ketones, ur (A) NEGATIVE mg/dL    TEST NOT REPORTED DUE TO COLOR INTERFERENCE OF URINE PIGMENT   Protein, ur (A) NEGATIVE mg/dL    TEST NOT REPORTED DUE TO COLOR INTERFERENCE OF URINE PIGMENT   Nitrite (A) NEGATIVE    TEST NOT REPORTED DUE TO COLOR INTERFERENCE OF URINE PIGMENT   Leukocytes, UA (A) NEGATIVE    TEST NOT REPORTED DUE TO COLOR INTERFERENCE OF URINE PIGMENT   RBC / HPF TOO NUMEROUS TO COUNT 0 - 5 RBC/hpf   WBC, UA TOO NUMEROUS TO COUNT 0 - 5 WBC/hpf   Bacteria, UA MANY (A) NONE SEEN   Squamous Epithelial / LPF NONE SEEN NONE SEEN  Pregnancy, urine POC     Status: Abnormal   Collection Time: 10/20/16 11:41 PM  Result Value Ref Range   Preg Test, Ur POSITIVE (A) NEGATIVE    Cultures: Results for orders placed or performed during the hospital encounter of 10/20/16  Blood Culture (routine x 2)     Status: None (Preliminary result)   Collection Time: 10/20/16 10:45 PM  Result Value Ref Range Status   Specimen Description BLOOD RAC  Final   Special Requests   Final    BOTTLES DRAWN AEROBIC AND ANAEROBIC Blood Culture adequate volume   Culture NO GROWTH < 12 HOURS  Final   Report Status PENDING  Incomplete  Blood Culture (routine x 2)     Status: None (Preliminary result)   Collection Time: 10/20/16 10:47 PM  Result Value Ref Range Status   Specimen Description BLOOD [facility]  Final   Special Requests   Final  BOTTLES DRAWN AEROBIC AND ANAEROBIC Blood Culture adequate volume   Culture NO GROWTH < 12 HOURS  Final   Report Status PENDING  Incomplete    Imaging:  Assessment   26 y.o. [id number] with endometritis in setting of elective surgical abortion 4 weeks ago  Plan   1) Endometritis - in setting of 4 week post uterine instrumentation, PID coverage is reasonable.  The patient was covered with  azithromycin for surgical ppx prior to her procedure at [facility].  She does have a PCN allergy.  Reportedly febrile to 104 at home. - will check CBC - will check GC/CT cultures - clinically improved as far as cramping with minimal bleeding since.  Given UPT still positive will check repeat HCG (was 65) - Given clinical improvement in bleeding and symptoms repeat US at this time not warranted - If labs and symptoms remain stable follow discharge later today or in AM  Based on the Sexually Transmitted Diseases Treatment Guidelines, 2010 (MMWR Recomm Rep 2010;59 and Updated by the [facility] after Expert panel meeting on April 30-Sep 09, 2011) the CDC diagnostic criteria for PID are as follows: One or more of the following minimum clinical criteria  1) Cervical motion tenderness   2) Uterine tenderness  3) Adnexal tenderness Specificity can be increased if any one of the following additional criteria are present  1) Oral temperatue >101F (38.3C)  2) Abnormal cervical mucopurulent discharge or cervical friability  3) Presence of abundent WBC on wet prep  4) Elevated ESR  5) Elevated CRP  6) Laboratory evidence of N. Gonorrhea or C. Trachomatis Most Specific criteria for PID   1) Endometrial biopsy showing evidence of endometritis  2) Transvaginal ultrasound or MRI showing evidence of tubo-ovarian abscess  3) Findings consistent with PID on laparoscopy  If diagnosis for PID is made the recommended parenteral regimens are:  1) Cefotetan 2g IV every 12hrs plus Doxycyline '100mg'$  IV or po every 12-hrs  2) Cefoxitin 2g IV every 6hrs plus Doxycyline '100mg'$  IV or po every 12-hrs  3) Clindamycin '900mg'$  IV every 8hrs plus Genatmicin loading dose of '2mg'$ /kg IV  followed by 1.'5mg'$ /kg every 8hrs maintenance  Alternative perenteral regimen  1) Ampicillin/Sulbactram 3g IV every 6hrs plus Doxycyline '100mg'$  IV or po every  97-IXV  Uncomplicated cases may be treated on outpatient basis   1)  Ceftriaxone '250mg'$  IM once plus Doxycyline '100mg'$  po every 12-hrs for 14  days with or without metronidazole '500mg'$  po bid for 14 days

## 2016-10-22 ENCOUNTER — Encounter: Payer: Self-pay | Admitting: Obstetrics and Gynecology

## 2016-10-22 DIAGNOSIS — O045 Genital tract and pelvic infection following (induced) termination of pregnancy: Secondary | ICD-10-CM | POA: Diagnosis not present

## 2016-10-22 LAB — URINE CULTURE

## 2016-10-22 MED ORDER — DOXYCYCLINE HYCLATE 100 MG PO TABS: 100.0000 mg | ORAL_TABLET | Freq: Two times a day (BID) | ORAL | 0 refills | Status: DC

## 2016-10-22 NOTE — Progress Notes (Signed)
Pt discharged home.  Discharge instructions, prescriptions and follow up appointment given to and reviewed with pt.  Pt verbalized understanding.  Escorted by auxillary. 

## 2016-10-22 NOTE — Discharge Summary (Signed)
DC Summary Discharge Summary   Patient ID: Sara Boyd [id number] 26 y.o. October 21, 1990  Admit date: 10/20/2016  Discharge date: 10/22/2016  Principal Diagnoses:  Endometritis, after elective abortion  Secondary Diagnoses:  none  Procedures performed during the hospitalization:  Admission for treatment of endometritis, including administration of IV antibiotics and treatment for heavy vaginal bleeding and pain symptoms.  HPI: 26 y.o. [id number] female who is 4 weeks status post elective abortion (surgical).  Started having heavy vaginal bleeding 1 week prior to presentation, then developed fevers on the night prior to admission with associated sweating, pain, and heavy vaginal bleeding with passage of clots.   Past Medical History: Denies   Past Surgical History:  Procedure Laterality Date  . BLADDER SURGERY    . DILATION AND CURETTAGE OF UTERUS     elective AB  . HERNIA REPAIR    . TONSILLECTOMY      Allergies  Allergen Reactions  . Pineapple Shortness Of Breath    Pt states her throat shuts completely. Anything pineapple, pt states it is juice, the whole fruit, or flavoring.   . Sulfa Antibiotics Shortness Of Breath    Pt states her throat closes as well, tightness.   . Latex Itching    Pt states her throat also starts closing.   . Vicodin [Hydrocodone-Acetaminophen] Other (See Comments)    Hallucinations.   . Codeine Rash    Pt states it feels like her skin is coming off.   . Penicillins Rash    Pt states it feels like her skin is coming off.     Social History  Substance Use Topics  . Smoking status: Former Games developermoker  . Smokeless tobacco: Never Used  . Alcohol use No   Family History: denies history of female cancers, bleeding or blood clotting disorders.   Hospital Course:  Admitted for suspected endometritis secondary to retained products of conception. These products were believed to be delivered in the ER by Dr. Tiburcio PeaHarris during a speculum  examination.  Her bleeding did greatly improve after this. She was admitted and administered IV antibiotics with initially levofloxacin and flagyl IV.  She was later switched to gentamicin and clindamycin.  She remained afebrile with improved pain symptoms. By HD#2 she was much improved clinically with laboratory evidence of decreasing WBC count and quantitative hCG.  She was discharged home in stable condition with a 2-week course of doxycycline, as per CDC recommendations.    Discharge Exam: BP 96/62 (BP Location: Right Arm)   Pulse 79   Temp 98.3 F (36.8 C) (Oral)   Resp 16   Ht 5\' 4"  (1.626 m)   Wt 280 lb (127 kg)   LMP 05/22/2016 (Approximate)   SpO2 99%   BMI 48.06 kg/m  General  no apparent distress   CV  RRR   Pulmonary  clear to ausculatation bllaterally   Abdomen  Soft, nontender, nondistended, +BS, no rebound or guarding.  Extremities  no edema, symmetric   Condition at Discharge: Stable  Complications affecting treatment: None  Discharge Medications:  Allergies as of 10/22/2016      Reactions   Pineapple Shortness Of Breath   Pt states her throat shuts completely. Anything pineapple, pt states it is juice, the whole fruit, or flavoring.    Sulfa Antibiotics Shortness Of Breath   Pt states her throat closes as well, tightness.    Latex Itching   Pt states her throat also starts closing.    Vicodin [hydrocodone-acetaminophen] Other (See Comments)  Hallucinations.    Codeine Rash   Pt states it feels like her skin is coming off.    Penicillins Rash   Pt states it feels like her skin is coming off.       Medication List    STOP taking these medications   Doxylamine-Pyridoxine 10-10 MG Tbec Commonly known as:  DICLEGIS     TAKE these medications   benzonatate 100 MG capsule Commonly known as:  TESSALON PERLES Take 1 capsule (100 mg total) by mouth 3 (three) times daily as needed for cough.   doxycycline 100 MG tablet Commonly known as:  VIBRA-TABS Take 1  tablet (100 mg total) by mouth 2 (two) times daily.   ondansetron 4 MG disintegrating tablet Commonly known as:  ZOFRAN ODT Take 1 tablet (4 mg total) by mouth every 8 (eight) hours as needed for nausea or vomiting.        Follow-up arrangements:  Follow-up Information    Nadara Mustard, MD Follow up in 2 week(s).   Specialty:  Obstetrics and Gynecology Why:  follow up hospital admission endometritis Contact information: [address] [place] [address] [phone]           Discharge Disposition: Home to self care  Signed: Thomasene Mohair, MD 10/22/2016 9:45 AM

## 2016-10-22 NOTE — Progress Notes (Signed)
  October 22, 2016  Patient: Sara Boyd  Date of Birth: 09-06-1990  Date of Visit: 10/20/2016    To Whom It May Concern:  [name] was seen and treated in our Hospital on 10/20/2016-10/22/2016. Sara Mylarlizabeth Nichole Bischoff  may return to work on 10/25/2016.  Sincerely,  Harless LittenKendra Cattaleya Wien, RN

## 2016-10-25 LAB — CULTURE, BLOOD (ROUTINE X 2)
CULTURE: NO GROWTH
Culture: NO GROWTH
SPECIAL REQUESTS: ADEQUATE
Special Requests: ADEQUATE

## 2016-10-26 ENCOUNTER — Telehealth: Payer: Self-pay | Admitting: Obstetrics & Gynecology

## 2016-10-26 ENCOUNTER — Telehealth: Payer: Self-pay

## 2016-10-26 DIAGNOSIS — N719 Inflammatory disease of uterus, unspecified: Secondary | ICD-10-CM

## 2016-10-26 MED ORDER — DOXYCYCLINE HYCLATE 100 MG PO TABS: 100.0000 mg | ORAL_TABLET | Freq: Two times a day (BID) | ORAL | 0 refills | Status: AC

## 2016-10-26 NOTE — Telephone Encounter (Signed)
Pt is calling wanting to be seen today or worked in this week. Please advise. Pt reports still having severe pain and bleeding. Post op schedule 11/08/16

## 2016-10-26 NOTE — Telephone Encounter (Signed)
Pt was in hosp last week for endometritis and given rx for doxycycline which she placed in her purse.  Now her purse is lost and pt asking for another rx for doxycycline to sent in.  [phone].

## 2016-10-26 NOTE — Telephone Encounter (Signed)
Per AMS due not having an ultrasound tech pt to be advise to follow up with ER due to needing an follow up ultrasound.

## 2016-10-26 NOTE — Telephone Encounter (Signed)
Maybe could see AS at 210 since I vacated that spot.  May need US and D&C, so should be seen, and he is OR next 2 days as well.

## 2016-10-26 NOTE — Telephone Encounter (Signed)
Rx sent to [facility]

## 2016-10-26 NOTE — Telephone Encounter (Signed)
Left voicemail for patient to be seen at ER.

## 2016-10-28 NOTE — Telephone Encounter (Signed)
?   Is appt made for US ?

## 2016-10-28 NOTE — Telephone Encounter (Signed)
Pt went to ER but left due to having to work the next day. Please advise work in for next week. Pt reports still is having heavy bleeding

## 2016-10-28 NOTE — Telephone Encounter (Signed)
Same advice, she does need an Ultrasound.  If not here then Harriett SineNancy [facility] (can call me w results)

## 2016-10-29 ENCOUNTER — Encounter
Admission: RE | Disposition: A | Discharge status: Home / Self Care | Payer: Self-pay | Source: Ambulatory Visit | Attending: Obstetrics & Gynecology

## 2016-10-29 ENCOUNTER — Other Ambulatory Visit: Payer: Self-pay | Admitting: Obstetrics & Gynecology

## 2016-10-29 ENCOUNTER — Ambulatory Visit: Payer: BLUE CROSS/BLUE SHIELD | Admitting: Certified Registered"

## 2016-10-29 ENCOUNTER — Encounter: Payer: Self-pay | Admitting: *Deleted

## 2016-10-29 ENCOUNTER — Ambulatory Visit (INDEPENDENT_AMBULATORY_CARE_PROVIDER_SITE_OTHER): Payer: BLUE CROSS/BLUE SHIELD

## 2016-10-29 ENCOUNTER — Ambulatory Visit
Admission: RE | Admit: 2016-10-29 | Discharge: 2016-10-29 | Disposition: A | Discharge status: Home / Self Care | Payer: BLUE CROSS/BLUE SHIELD | Source: Ambulatory Visit | Attending: Obstetrics & Gynecology | Admitting: Obstetrics & Gynecology

## 2016-10-29 DIAGNOSIS — O034 Incomplete spontaneous abortion without complication: Secondary | ICD-10-CM | POA: Insufficient documentation

## 2016-10-29 DIAGNOSIS — R58 Hemorrhage, not elsewhere classified: Secondary | ICD-10-CM | POA: Insufficient documentation

## 2016-10-29 DIAGNOSIS — Z6841 Body Mass Index (BMI) 40.0 and over, adult: Secondary | ICD-10-CM | POA: Diagnosis not present

## 2016-10-29 DIAGNOSIS — O209 Hemorrhage in early pregnancy, unspecified: Secondary | ICD-10-CM

## 2016-10-29 DIAGNOSIS — Z87891 Personal history of nicotine dependence: Secondary | ICD-10-CM | POA: Insufficient documentation

## 2016-10-29 HISTORY — PX: DILATION AND EVACUATION: SHX1459

## 2016-10-29 LAB — CBC
HEMATOCRIT: 37 % (ref 35.0–47.0)
Hemoglobin: 12.6 g/dL (ref 12.0–16.0)
MCH: 29.3 pg (ref 26.0–34.0)
MCHC: 34.1 g/dL (ref 32.0–36.0)
MCV: 86 fL (ref 80.0–100.0)
PLATELETS: 358 10*3/uL (ref 150–440)
RBC: 4.3 MIL/uL (ref 3.80–5.20)
RDW: 14 % (ref 11.5–14.5)
WBC: 7.1 10*3/uL (ref 3.6–11.0)

## 2016-10-29 SURGERY — DILATION AND EVACUATION, UTERUS
Anesthesia: General

## 2016-10-29 MED ORDER — MIDAZOLAM HCL 2 MG/2ML IJ SOLN
INTRAMUSCULAR | Status: AC
  Filled 2016-10-29: qty 2

## 2016-10-29 MED ORDER — FENTANYL CITRATE (PF) 100 MCG/2ML IJ SOLN
INTRAMUSCULAR | Status: AC
  Filled 2016-10-29: qty 2

## 2016-10-29 MED ORDER — LIDOCAINE HCL (CARDIAC) 20 MG/ML IV SOLN
INTRAVENOUS | Status: DC | PRN
  Administered 2016-10-29: 50 mg via INTRAVENOUS

## 2016-10-29 MED ORDER — DEXAMETHASONE SODIUM PHOSPHATE 10 MG/ML IJ SOLN
INTRAMUSCULAR | Status: DC | PRN
  Administered 2016-10-29: 10 mg via INTRAVENOUS

## 2016-10-29 MED ORDER — ONDANSETRON HCL 4 MG/2ML IJ SOLN
INTRAMUSCULAR | Status: DC | PRN
  Administered 2016-10-29: 4 mg via INTRAVENOUS

## 2016-10-29 MED ORDER — DOXYCYCLINE HYCLATE 100 MG PO CAPS: 100.0000 mg | ORAL_CAPSULE | Freq: Two times a day (BID) | ORAL | 0 refills | Status: DC

## 2016-10-29 MED ORDER — ONDANSETRON HCL 4 MG/2ML IJ SOLN: 4.0000 mg | Freq: Once | INTRAMUSCULAR | Status: DC | PRN

## 2016-10-29 MED ORDER — MIDAZOLAM HCL 2 MG/2ML IJ SOLN
INTRAMUSCULAR | Status: DC | PRN
  Administered 2016-10-29: 2 mg via INTRAVENOUS

## 2016-10-29 MED ORDER — PROPOFOL 10 MG/ML IV BOLUS
INTRAVENOUS | Status: AC
  Filled 2016-10-29: qty 20

## 2016-10-29 MED ORDER — LIDOCAINE HCL (PF) 2 % IJ SOLN
INTRAMUSCULAR | Status: AC
  Filled 2016-10-29: qty 2

## 2016-10-29 MED ORDER — METHYLERGONOVINE MALEATE 0.2 MG PO TABS: 0.2000 mg | ORAL_TABLET | Freq: Four times a day (QID) | ORAL | 0 refills | Status: DC

## 2016-10-29 MED ORDER — NAPROXEN 500 MG PO TABS: 500.0000 mg | ORAL_TABLET | Freq: Two times a day (BID) | ORAL | 1 refills | Status: DC | PRN

## 2016-10-29 MED ORDER — SUCCINYLCHOLINE CHLORIDE 20 MG/ML IJ SOLN
INTRAMUSCULAR | Status: AC
  Filled 2016-10-29: qty 1

## 2016-10-29 MED ORDER — SUCCINYLCHOLINE CHLORIDE 20 MG/ML IJ SOLN
INTRAMUSCULAR | Status: DC | PRN
  Administered 2016-10-29: 120 mg via INTRAVENOUS

## 2016-10-29 MED ORDER — PROPOFOL 10 MG/ML IV BOLUS
INTRAVENOUS | Status: DC | PRN
  Administered 2016-10-29: 200 mg via INTRAVENOUS

## 2016-10-29 MED ORDER — ROCURONIUM BROMIDE 50 MG/5ML IV SOLN
INTRAVENOUS | Status: AC
  Filled 2016-10-29: qty 1

## 2016-10-29 MED ORDER — DEXAMETHASONE SODIUM PHOSPHATE 10 MG/ML IJ SOLN
INTRAMUSCULAR | Status: AC
  Filled 2016-10-29: qty 1

## 2016-10-29 MED ORDER — ONDANSETRON HCL 4 MG/2ML IJ SOLN
INTRAMUSCULAR | Status: AC
  Filled 2016-10-29: qty 2

## 2016-10-29 MED ORDER — FENTANYL CITRATE (PF) 100 MCG/2ML IJ SOLN
INTRAMUSCULAR | Status: DC | PRN
  Administered 2016-10-29: 100 ug via INTRAVENOUS
  Administered 2016-10-29 (×2): 50 ug via INTRAVENOUS

## 2016-10-29 MED ORDER — LACTATED RINGERS IV SOLN
INTRAVENOUS | Status: DC
  Administered 2016-10-29: 17:00:00 via INTRAVENOUS

## 2016-10-29 MED ORDER — FENTANYL CITRATE (PF) 100 MCG/2ML IJ SOLN: 25.0000 ug | INTRAMUSCULAR | Status: DC | PRN

## 2016-10-29 SURGICAL SUPPLY — 21 items
BAG COUNTER SPONGE EZ (MISCELLANEOUS) ×2 IMPLANT
CANISTER SUC SOCK COL 7IN (MISCELLANEOUS) ×2 IMPLANT
CATH ROBINSON RED A/P 16FR (CATHETERS) ×2 IMPLANT
FILTER UTR ASPR SPEC (MISCELLANEOUS) ×1 IMPLANT
FLTR UTR ASPR SPEC (MISCELLANEOUS) ×2
GLOVE BIO SURGEON STRL SZ8 (GLOVE) ×2 IMPLANT
GOWN STRL REUS W/ TWL LRG LVL3 (GOWN DISPOSABLE) ×1 IMPLANT
GOWN STRL REUS W/ TWL XL LVL3 (GOWN DISPOSABLE) ×1 IMPLANT
GOWN STRL REUS W/TWL LRG LVL3 (GOWN DISPOSABLE) ×1
GOWN STRL REUS W/TWL XL LVL3 (GOWN DISPOSABLE) ×1
KIT BERKELEY 1ST TRIMESTER 3/8 (MISCELLANEOUS) ×2 IMPLANT
KIT RM TURNOVER CYSTO AR (KITS) ×2 IMPLANT
NS IRRIG 500ML POUR BTL (IV SOLUTION) ×2 IMPLANT
PACK DNC HYST (MISCELLANEOUS) ×2 IMPLANT
PAD OB MATERNITY 4.3X12.25 (PERSONAL CARE ITEMS) ×2 IMPLANT
PAD PREP 24X41 OB/GYN DISP (PERSONAL CARE ITEMS) ×2 IMPLANT
SET BERKELEY SUCTION TUBING (SUCTIONS) ×2 IMPLANT
TOWEL OR 17X26 4PK STRL BLUE (TOWEL DISPOSABLE) ×2 IMPLANT
VACURETTE 10 RIGID CVD (CANNULA) IMPLANT
VACURETTE 12 RIGID CVD (CANNULA) IMPLANT
VACURETTE 8 RIGID CVD (CANNULA) ×2 IMPLANT

## 2016-10-29 NOTE — Progress Notes (Signed)
[facility] [address] [id number] [place] [id number] Phone: [phone]  October 29, 2016  Patient: Sara Boyd  Date of Birth: Feb 25, 1991  Date of Visit: 10/29/2016    To Whom It May Concern:  Brunilda Payorlizabeth Baker was seen and treated in our Hospital for surgery on 10/29/2016. Sara Boyd  may return to work on 11/01/16.  Sincerely,  Annamarie MajorPaul Abimbola Aki, MD [facility]

## 2016-10-29 NOTE — Anesthesia Preprocedure Evaluation (Addendum)
Anesthesia Evaluation  Patient identified by MRN, date of birth, ID band Patient awake    Reviewed: Allergy & Precautions, NPO status , Patient's Chart, lab work & pertinent test results  Airway Mallampati: II  TM Distance: <3 FB     Dental  (+) Caps   Pulmonary former smoker,    Pulmonary exam normal        Cardiovascular negative cardio ROS Normal cardiovascular exam     Neuro/Psych negative neurological ROS  negative psych ROS   GI/Hepatic negative GI ROS, Neg liver ROS,   Endo/Other  Morbid obesity  Renal/GU negative Renal ROS  negative genitourinary   Musculoskeletal negative musculoskeletal ROS (+)   Abdominal Normal abdominal exam  (+)   Peds negative pediatric ROS (+)  Hematology negative hematology ROS (+)   Anesthesia Other Findings   Reproductive/Obstetrics                            Anesthesia Physical Anesthesia Plan  ASA: II and emergent  Anesthesia Plan: General   Post-op Pain Management:    Induction: Intravenous, Cricoid pressure planned and Rapid sequence  PONV Risk Score and Plan: 4 or greater and Ondansetron, Dexamethasone, Propofol, Midazolam, Scopolamine patch - Pre-op and Treatment may vary due to age or medical condition  Airway Management Planned: Oral ETT  Additional Equipment:   Intra-op Plan:   Post-operative Plan: Extubation in OR  Informed Consent: I have reviewed the patients History and Physical, chart, labs and discussed the procedure including the risks, benefits and alternatives for the proposed anesthesia with the patient or authorized representative who has indicated his/her understanding and acceptance.   Dental advisory given  Plan Discussed with: CRNA and Surgeon  Anesthesia Plan Comments:         Anesthesia Quick Evaluation

## 2016-10-29 NOTE — Op Note (Signed)
  Operative Note  10/29/2016 5:45 PM  PRE-OP DIAGNOSIS: Bleeding, Retained products   POST-OP DIAGNOSIS: same  SURGEON: Annamarie MajorPaul Shara Hartis, MD, FACOG  ANESTHESIA: Choice   PROCEDURE: Procedure(s): DILATATION AND EVACUATION   ESTIMATED BLOOD LOSS: Minimal   SPECIMENS: POC  COMPLICATIONS: none  DISPOSITION: PACU - hemodynamically stable.  CONDITION: stable  FINDINGS: Exam under anesthesia revealed a 4 wk size uterus without palpable adnexal masses.   INDICATION FOR PROCEDURE: Continued bleeding several weeks after EAb.  US with thickening.  PROCEDURE IN DETAIL: After informed consent was obtained, the patient was taken to the operating room where anesthesia was obtained without difficulty. The patient was positioned in the dorsal lithotomy position with [employer] Stirrups. Time out was performed and an exam under anesthesia was performed. The vagina, perineum, and lower abdomen were prepped and draped in a normal sterile fashion. The bladder was emptied with an I&O catheter. A speculum was placed into the vagina and the cervix was grasped with a single toothed tenaculum. The uterus was sounded to 9cm.  The cervix was gently dilated to 20 [place] with  [employer] Corporation[employer] dilators. The suction was then tested and found to be adequate, and a 8mm rigid suction cannula was advanced into the uterine cavity. The suction was activated and the contents of the uterus were aspirated until no further tissue was obtained. The uterus was then curetted to gritty texture throughout.  At the end of the procedure bleeding was noted to be Minimal.  All instruments were then removed from the vagina.The patient tolerated the procedure well. All sponge, instrument, and needle counts were correct. The patient was taken to the recovery room in good condition.

## 2016-10-29 NOTE — Progress Notes (Signed)
Peri pad only minimal drainage   Can move all extremities    No redness or numbness of extremities  Ted hose in place

## 2016-10-29 NOTE — Anesthesia Procedure Notes (Signed)
Procedure Name: Intubation Performed by: Mathews ArgyleLOGAN, Timothey Dahlstrom Pre-anesthesia Checklist: Patient identified, Patient being monitored, Timeout performed, Emergency Drugs available and Suction available Patient Re-evaluated:Patient Re-evaluated prior to inductionOxygen Delivery Method: Circle system utilized Preoxygenation: Pre-oxygenation with 100% oxygen Intubation Type: IV induction, Rapid sequence and Cricoid Pressure applied Laryngoscope Size: 3 and McGraph Grade View: Grade I Tube type: Oral Tube size: 7.0 mm Number of attempts: 1 Airway Equipment and Method: Stylet,  Video-laryngoscopy and Patient positioned with wedge pillow Placement Confirmation: ETT inserted through vocal cords under direct vision,  positive ETCO2 and breath sounds checked- equal and bilateral Secured at: 21 cm Tube secured with: Tape Dental Injury: Teeth and Oropharynx as per pre-operative assessment  Difficulty Due To: Difficulty was anticipated, Difficult Airway- due to large tongue and Difficult Airway- due to limited oral opening Future Recommendations: Recommend- induction with short-acting agent, and alternative techniques readily available

## 2016-10-29 NOTE — Transfer of Care (Signed)
Immediate Anesthesia Transfer of Care Note  Patient: Sara MylarElizabeth Nichole Boyd  Procedure(s) Performed: Procedure(s): DILATATION AND EVACUATION (N/A)  Patient Location: PACU  Anesthesia Type:General  Level of Consciousness: awake, alert  and oriented  Airway & Oxygen Therapy: Patient Spontanous Breathing and Patient connected to face mask oxygen  Post-op Assessment: Report given to RN and Post -op Vital signs reviewed and stable  Post vital signs: Reviewed  Last Vitals:  Vitals:   10/29/16 1639 10/29/16 1750  BP: 123/65 97/70  Pulse: 85 97  Resp: 16 16  Temp: 36.4 C     Last Pain:  Vitals:   10/29/16 1639  TempSrc: Oral         Complications: No apparent anesthesia complications

## 2016-10-29 NOTE — Telephone Encounter (Signed)
I called and left voicemail for patient to call back to be schedule for Ultra sound and Dr. Tiburcio PeaHarris will call patient back with result. Please schedule patient when she calls

## 2016-10-29 NOTE — Telephone Encounter (Signed)
Dr Tiburcio PeaHarris, Will you put in orders for ultrasound. I will try to reach patient today to bring her in for a ultrasound.

## 2016-10-29 NOTE — Anesthesia Post-op Follow-up Note (Cosign Needed)
Anesthesia QCDR form completed.        

## 2016-10-29 NOTE — Discharge Instructions (Signed)
Dilation and Curettage or Vacuum Curettage, Care After °This sheet gives you information about how to care for yourself after your procedure. Your health care provider may also give you more specific instructions. If you have problems or questions, contact your health care provider. °What can I expect after the procedure? °After your procedure, it is common to have: °· Mild pain or cramping. °· Some vaginal bleeding or spotting. ° °These may last for up to 2 weeks after your procedure. °Follow these instructions at home: °Activity ° °· Do not drive or use heavy machinery while taking prescription pain medicine. °· Avoid driving for the first 24 hours after your procedure. °· Take frequent, short walks, followed by rest periods, throughout the day. Ask your health care provider what activities are safe for you. After 1-2 days, you may be able to return to your normal activities. °· Do not lift anything heavier than 10 lb (4.5 kg) until your health care provider approves. °· For at least 2 weeks, or as long as told by your health care provider, do not: °? Douche. °? Use tampons. °? Have sexual intercourse. °General instructions ° °· Take over-the-counter and prescription medicines only as told by your health care provider. This is especially important if you take blood thinning medicine. °· Do not take baths, swim, or use a hot tub until your health care provider approves. Take showers instead of baths. °· Wear compression stockings as told by your health care provider. These stockings help to prevent blood clots and reduce swelling in your legs. °· It is your responsibility to get the results of your procedure. Ask your health care provider, or the department performing the procedure, when your results will be ready. °· Keep all follow-up visits as told by your health care provider. This is important. °Contact a health care provider if: °· You have severe cramps that get worse or that do not get better with  medicine. °· You have severe abdominal pain. °· You cannot drink fluids without vomiting. °· You develop pain in a different area of your pelvis. °· You have bad-smelling vaginal discharge. °· You have a rash. °Get help right away if: °· You have vaginal bleeding that soaks more than one sanitary pad in 1 hour, for 2 hours in a row. °· You pass large blood clots from your vagina. °· You have a fever that is above 100.4°F (38.0°C). °· Your abdomen feels very tender or hard. °· You have chest pain. °· You have shortness of breath. °· You cough up blood. °· You feel dizzy or light-headed. °· You faint. °· You have pain in your neck or shoulder area. °This information is not intended to replace advice given to you by your health care provider. Make sure you discuss any questions you have with your health care provider. °Document Released: 04/23/2000 Document Revised: 12/24/2015 Document Reviewed: 11/27/2015 °Elsevier Interactive Patient Education © 2018 [employer] Inc[employer] ° °

## 2016-10-29 NOTE — Telephone Encounter (Signed)
Pt is schedule for u/s 10/29/16 at 3 pm

## 2016-10-29 NOTE — H&P (Addendum)
Obstetrics & Gynecology History and Physical Note  Date of Admission for surgery: 10/29/2016   Primary OBGYN: [facility] Primary Care Provider: Evelene CroonNiemeyer, Meindert  CC: Bleeding, Retained products  History of Present Illness: Ms. Sara Boyd is a 26 y.o. [phone] (Patient's last menstrual period was 07/08/2016.), with the above CC. She has been seen on several occasions after having an elective termination 5 weeks ago.  She has been having intermitttant bleeding with occasional pain.  No fever.  Seen in ER one week ago with removal of clot and tissue in exam room.  Methergine and follow up suggested resolution of sx's, but they have recurred.  US today reveals continued thickening in the lining of the uterus suggestive of retained tissue.  ROS: A review of systems was performed and negative, except as stated in the above HPI.  OBGYN History: As per HPI. OB History    Gravida Para Term Preterm AB Living   4 2 1 1  0 2   SAB TAB Ectopic Multiple Live Births   0 0 0 0        Obstetric Comments   Pt water ruptured @ 19 weeks, bed rest, baby with CP     Past Medical History: Past Medical History:  Diagnosis Date  . Medical history non-contributory     Past Surgical History: Past Surgical History:  Procedure Laterality Date  . BLADDER SURGERY    . DILATION AND CURETTAGE OF UTERUS     elective AB  . HERNIA REPAIR    . TONSILLECTOMY     Family History:  History reviewed. No pertinent family history. She denies any female cancers, bleeding or blood clotting disorders.   Social History:  Social History   Social History  . Marital status: Married    Spouse name: N/A  . Number of children: N/A  . Years of education: N/A   Occupational History  . Not on file.   Social History Main Topics  . Smoking status: Former Games developermoker  . Smokeless tobacco: Never Used  . Alcohol use No  . Drug use: No  . Sexual activity: Yes   Other Topics Concern  . Not on file   Social History Narrative   . No narrative on file    Allergy: Allergies  Allergen Reactions  . Pineapple Shortness Of Breath    Pt states her throat shuts completely. Anything pineapple, pt states it is juice, the whole fruit, or flavoring.   . Sulfa Antibiotics Shortness Of Breath    Pt states her throat closes as well, tightness.   . Latex Itching    Pt states her throat also starts closing.   . Vicodin [Hydrocodone-Acetaminophen] Other (See Comments)    Hallucinations.   . Codeine Rash    Pt states it feels like her skin is coming off.   . Penicillins Rash    Pt states it feels like her skin is coming off.     Current Outpatient Medications: Prescriptions Prior to Admission  Medication Sig Dispense Refill Last Dose  . doxycycline (VIBRA-TABS) 100 MG tablet Take 1 tablet (100 mg total) by mouth 2 (two) times daily. 28 tablet 0 10/29/2016 at Unknown time  . benzonatate (TESSALON PERLES) 100 MG capsule Take 1 capsule (100 mg total) by mouth 3 (three) times daily as needed for cough. (Patient not taking: Reported on 10/29/2016) 20 capsule 0 Completed Course at Unknown time  . ondansetron (ZOFRAN ODT) 4 MG disintegrating tablet Take 1 tablet (4 mg total) by mouth every  8 (eight) hours as needed for nausea or vomiting. (Patient not taking: Reported on 10/29/2016) 20 tablet 2 Completed Course at Unknown time    Hospital Medications: Current Facility-Administered Medications  Medication Dose Route Frequency Provider Last Rate Last Dose  . lactated ringers infusion   Intravenous Continuous Nadara Mustard, MD        Physical Exam: Vitals:   10/29/16 1639  BP: 123/65  Pulse: 85  Resp: 16  Temp: 97.6 F (36.4 C)  TempSrc: Oral  SpO2: 100%  Weight: 280 lb (127 kg)  Height: 5\' 4"  (1.626 m)    Temp:  [97.6 F (36.4 C)] 97.6 F (36.4 C) (06/22 1639) Pulse Rate:  [85] 85 (06/22 1639) Resp:  [16] 16 (06/22 1639) BP: (123)/(65) 123/65 (06/22 1639) SpO2:  [100 %] 100 % (06/22 1639) Weight:  [280 lb (127  kg)] 280 lb (127 kg) (06/22 1639) No intake/output data recorded. No intake/output data recorded. No intake or output data in the 24 hours ending 10/29/16 1641  Body mass index is 48.06 kg/m. Constitutional: Well nourished, well developed female in no acute distress.  HEENT: normal Neck:  Supple, normal appearance, and no thyromegaly  Cardiovascular:Regular rate and rhythm.  No murmurs, rubs or gallops. Respiratory:  Clear to auscultation bilateral. Normal respiratory effort Abdomen: positive bowel sounds and no masses, hernias; diffusely non tender to palpation, non distended Neuro: grossly intact Psych:  Normal mood and affect.  Skin:  Warm and dry.  MS: normal gait and normal bilateral lower extremity strength/ROM/symmetry Lymphatic:  No inguinal lymphadenopathy.   Imaging:  Ultrasound independently reviewed/interpreted by self.   Assessment: Ms. Kwasny is a 26 y.o. [phone] (Patient's last menstrual period was 07/08/2016.) who presented to [facility] with complaints of bleeding and retained POC; plan for Suction D&C discussed and planned.  Plan:                     We briefly discussed management options including expectant management, medical management, and surgical management as well as their relative success rates and complications.  Dilation and curettage has the highest rate of uterine evacuation, but carries with is operative cost, surgical and anesthetic risk.  While these risk are relatively small they nevertheless include infection, bleeding, uterine perforation, formation of uterine synechia, and in rare cases death. The patient elects to proceed with surgical management for her bleeding. I have discussed with the patient the indications for the procedure. Included in the discussion were the options of therapy, as wall as their individual risks, benefits, and complications. Ample time was given to answer all questions.   While the incidence is low, the risks from this surgery  include, but are not limited to, the risks of anesthesia, hemorrhage, infection, perforation, and injury to adjacent structures including bowel, bladder and blood vessels.   Annamarie Major, MD, [facility], [facility] 10/29/2016  4:41 PM Pager [phone]

## 2016-10-29 NOTE — Telephone Encounter (Signed)
done

## 2016-10-30 ENCOUNTER — Emergency Department
Admission: EM | Admit: 2016-10-30 | Discharge: 2016-10-30 | Disposition: A | Discharge status: Home / Self Care | Payer: BLUE CROSS/BLUE SHIELD | Attending: Emergency Medicine | Admitting: Emergency Medicine

## 2016-10-30 ENCOUNTER — Encounter: Payer: Self-pay | Admitting: Emergency Medicine

## 2016-10-30 ENCOUNTER — Emergency Department: Payer: BLUE CROSS/BLUE SHIELD

## 2016-10-30 DIAGNOSIS — G8918 Other acute postprocedural pain: Secondary | ICD-10-CM | POA: Insufficient documentation

## 2016-10-30 DIAGNOSIS — Z87891 Personal history of nicotine dependence: Secondary | ICD-10-CM | POA: Insufficient documentation

## 2016-10-30 DIAGNOSIS — M79604 Pain in right leg: Secondary | ICD-10-CM

## 2016-10-30 DIAGNOSIS — Z91040 Latex allergy status: Secondary | ICD-10-CM | POA: Diagnosis not present

## 2016-10-30 MED ORDER — TRAMADOL HCL 50 MG PO TABS: 50.0000 mg | ORAL_TABLET | Freq: Four times a day (QID) | ORAL | 0 refills | Status: DC | PRN

## 2016-10-30 NOTE — ED Notes (Signed)
Pt states that she was in the hospital and on bed rest last week, yesterday she had a D/C endometritis. Pt states that she started having severe pain in her right calf yesterday after surgery, pain is worse when she dorsal flexes toes. Patient states that pain is worse when walking also. Pt states that she is having some shortness of breath also.

## 2016-10-30 NOTE — ED Provider Notes (Signed)
[facility] Emergency Department Provider Note  Time seen: 9:20 AM  I have reviewed the triage vital signs and the nursing notes.   HISTORY  Chief Complaint Leg Pain    HPI Sara Boyd is a 26 y.o. female with a recent past medical history of endometritis, status post D&C yesterday who presents to the emergency department for right calf pain. According to the patient for the past one week or so she has been fairly inactive mostly on bed rest. She had a D&C performed yesterday under general anesthesia. Patient states since awakening from the dance yesterday she has had right calf pain which worsened today. She called the nurse who recommended she come to the ER to make sure she did not have a blood clot. Patient denies any chest pain, trouble breathing or fever. Denies any history of blood clots in the past. The only birth until the patient is currently taking is nexplanon.  Describes the right calf pain as mild to moderate, worse with movement of her ankle.  Past Medical History:  Diagnosis Date  . Medical history non-contributory     Patient Active Problem List   Diagnosis Date Noted  . Retained products of conception following abortion 10/29/2016  . Endometritis 10/21/2016    Past Surgical History:  Procedure Laterality Date  . BLADDER SURGERY    . DILATION AND CURETTAGE OF UTERUS     elective AB  . HERNIA REPAIR    . TONSILLECTOMY      Prior to Admission medications   Medication Sig Start Date End Date Taking? Authorizing Provider  benzonatate (TESSALON PERLES) 100 MG capsule Take 1 capsule (100 mg total) by mouth 3 (three) times daily as needed for cough. Patient not taking: Reported on 10/29/2016 10/05/16 10/05/17  Little, Traci M, PA-C  doxycycline (VIBRA-TABS) 100 MG tablet Take 1 tablet (100 mg total) by mouth 2 (two) times daily. 10/26/16 11/09/16  Nadara MustardHarris, Robert P, MD  doxycycline (VIBRAMYCIN) 100 MG capsule Take 1 capsule (100 mg  total) by mouth 2 (two) times daily. 10/29/16   Nadara MustardHarris, Robert P, MD  methylergonovine (METHERGINE) 0.2 MG tablet Take 1 tablet (0.2 mg total) by mouth 4 (four) times daily. For Four doses to minimize bleeding 10/29/16   Nadara MustardHarris, Robert P, MD  naproxen (NAPROSYN) 500 MG tablet Take 1 tablet (500 mg total) by mouth 2 (two) times daily as needed. 10/29/16   Nadara MustardHarris, Robert P, MD  ondansetron (ZOFRAN ODT) 4 MG disintegrating tablet Take 1 tablet (4 mg total) by mouth every 8 (eight) hours as needed for nausea or vomiting. Patient not taking: Reported on 10/29/2016 08/23/16   Gunnar BullaLawhorn, Jenkins Michelle, CNM    Allergies  Allergen Reactions  . Pineapple Shortness Of Breath    Pt states her throat shuts completely. Anything pineapple, pt states it is juice, the whole fruit, or flavoring.   . Sulfa Antibiotics Shortness Of Breath    Pt states her throat closes as well, tightness.   . Latex Itching    Pt states her throat also starts closing.   . Vicodin [Hydrocodone-Acetaminophen] Other (See Comments)    Hallucinations.   . Codeine Rash    Pt states it feels like her skin is coming off.   . Penicillins Rash    Pt states it feels like her skin is coming off.     No family history on file.  Social History Social History  Substance Use Topics  . Smoking status: Former Games developermoker  .  Smokeless tobacco: Never Used  . Alcohol use No    Review of Systems Constitutional: Negative for fever. Cardiovascular: Negative for chest pain. Respiratory: Negative for shortness of breath. Gastrointestinal: Negative for abdominal pain, vomiting and diarrhea. Musculoskeletal: Right calf pain Skin: Negative for rash. Neurological: Negative for headache All other ROS negative  ____________________________________________   PHYSICAL EXAM:  VITAL SIGNS: ED Triage Vitals  Enc Vitals Group     BP 10/30/16 0910 137/82     Pulse Rate 10/30/16 0910 93     Resp 10/30/16 0910 20     Temp 10/30/16 0910 98.6 F (37  C)     Temp Source 10/30/16 0910 Oral     SpO2 10/30/16 0910 98 %     Weight 10/30/16 0910 280 lb (127 kg)     Height 10/30/16 0910 5\' 4"  (1.626 m)     Head Circumference --      Peak Flow --      Pain Score 10/30/16 0909 7     Pain Loc --      Pain Edu? --      Excl. in GC? --     Constitutional: Alert and oriented. Well appearing and in no distress. Eyes: Normal exam ENT   Head: Normocephalic and atraumatic.   Mouth/Throat: Mucous membranes are moist. Cardiovascular: Normal rate, regular rhythm. No murmur Respiratory: Normal respiratory effort without tachypnea nor retractions. Breath sounds are clear Gastrointestinal: Soft and nontender. No distention. Musculoskeletal: No lower extremity edema. Mild right calf tenderness, neurovascular intact distally. Warm distally. Sensation intact distally. Patient does state increased pain when moving her toes or ankle but she is able to do so. Neurologic:  Normal speech and language. No gross focal neurologic deficits Skin:  Skin is warm, dry and intact.  Psychiatric: Mood and affect are normal.   ____________________________________________     RADIOLOGY  Ultrasound negative for DVT  ____________________________________________   INITIAL IMPRESSION / ASSESSMENT AND PLAN / ED COURSE  Pertinent labs & imaging results that were available during my care of the patient were reviewed by me and considered in my medical decision making (see chart for details).  Patient presents to the emergency department with right calf pain 1 day after a D&C under general anesthesia. I discussed with the patient that the pain could very likely be due to positioning during the procedure, however the important thing is ruling out a DVT as the patient was on bedrest for one week prior. We will obtain an ultrasound of the right lower extremity. Patient denies any fever, chest pain or trouble breathing. Denies any abdominal pain today.  Ultrasound is  negative for DVT. Suspect likely contusions due to positioning during procedure. We will discharge with a short course of pain medication. Patient agreeable to plan.  ____________________________________________   FINAL CLINICAL IMPRESSION(S) / ED DIAGNOSES  Contusion    Minna Antis, MD 10/30/16 1054

## 2016-10-30 NOTE — Anesthesia Postprocedure Evaluation (Signed)
Anesthesia Post Note  Patient: Sara Boyd  Procedure(s) Performed: Procedure(s) (LRB): DILATATION AND EVACUATION (N/A)  Patient location during evaluation: PACU Anesthesia Type: General Level of consciousness: awake and alert and oriented Pain management: pain level controlled Vital Signs Assessment: post-procedure vital signs reviewed and stable Respiratory status: spontaneous breathing Cardiovascular status: blood pressure returned to baseline Anesthetic complications: no     Last Vitals:  Vitals:   10/29/16 1819 10/29/16 1832  BP: 133/77 118/85  Pulse: 89 82  Resp: 16   Temp: 36.4 C     Last Pain:  Vitals:   10/29/16 1819  TempSrc: Temporal                 Chenoah Mcnally

## 2016-10-30 NOTE — ED Triage Notes (Signed)
Patient had D&C yesterday. Noticed severe pain to right calf after waking up from surgery. States pain has continued to increase since then and is unable to flex her toes.

## 2016-10-31 ENCOUNTER — Encounter: Payer: Self-pay | Admitting: Obstetrics & Gynecology

## 2016-11-01 LAB — TYPE AND SCREEN
ABO/RH(D): O POS
Antibody Screen: NEGATIVE
DAT, IgG: NEGATIVE
Weak D: POSITIVE

## 2016-11-02 LAB — SURGICAL PATHOLOGY

## 2016-11-08 ENCOUNTER — Ambulatory Visit: Payer: Self-pay | Admitting: Obstetrics & Gynecology

## 2016-11-30 ENCOUNTER — Ambulatory Visit: Payer: Self-pay | Admitting: Obstetrics & Gynecology

## 2017-02-15 ENCOUNTER — Emergency Department
Admission: EM | Admit: 2017-02-15 | Discharge: 2017-02-15 | Disposition: A | Discharge status: Home / Self Care | Payer: BLUE CROSS/BLUE SHIELD | Attending: Emergency Medicine | Admitting: Emergency Medicine

## 2017-02-15 ENCOUNTER — Encounter: Payer: Self-pay | Admitting: *Deleted

## 2017-02-15 DIAGNOSIS — J209 Acute bronchitis, unspecified: Secondary | ICD-10-CM | POA: Insufficient documentation

## 2017-02-15 DIAGNOSIS — Z91040 Latex allergy status: Secondary | ICD-10-CM | POA: Insufficient documentation

## 2017-02-15 DIAGNOSIS — Z79899 Other long term (current) drug therapy: Secondary | ICD-10-CM | POA: Insufficient documentation

## 2017-02-15 DIAGNOSIS — Z87891 Personal history of nicotine dependence: Secondary | ICD-10-CM | POA: Insufficient documentation

## 2017-02-15 MED ORDER — ALBUTEROL SULFATE HFA 108 (90 BASE) MCG/ACT IN AERS: 2.0000 | INHALATION_SPRAY | Freq: Four times a day (QID) | RESPIRATORY_TRACT | 0 refills | Status: DC | PRN

## 2017-02-15 MED ORDER — AZITHROMYCIN 250 MG PO TABS: ORAL_TABLET | ORAL | 0 refills | Status: DC

## 2017-02-15 MED ORDER — BENZONATATE 100 MG PO CAPS: 100.0000 mg | ORAL_CAPSULE | Freq: Three times a day (TID) | ORAL | 0 refills | Status: DC | PRN

## 2017-02-15 MED ORDER — PREDNISONE 10 MG PO TABS: ORAL_TABLET | ORAL | 0 refills | Status: DC

## 2017-02-15 MED ORDER — METHYLPREDNISOLONE SODIUM SUCC 125 MG IJ SOLR
125.0000 mg | Freq: Once | INTRAMUSCULAR | Status: DC
  Filled 2017-02-15: qty 2

## 2017-02-15 NOTE — ED Provider Notes (Signed)
[facility] Emergency Department Provider Note  ____________________________________________  Time seen: Approximately 3:45 PM  I have reviewed the triage vital signs and the nursing notes.   HISTORY  Chief Complaint Cough and Nasal Congestion    HPI Sara Boyd is a 26 y.o. female that presents to the emergency department for evaluation of nasal congestion, ear pain, cough for 2 weeks. Patient is coughing up yellow sputum. She does not smoke. No allergies or asthma. Both of her children have bronchitis. No fever, shortness of breath, chest pain, nausea, vomiting, abdominal pain.   Past Medical History:  Diagnosis Date  . Medical history non-contributory     Patient Active Problem List   Diagnosis Date Noted  . Retained products of conception following abortion 10/29/2016  . Endometritis 10/21/2016    Past Surgical History:  Procedure Laterality Date  . BLADDER SURGERY    . DILATION AND CURETTAGE OF UTERUS     elective AB  . DILATION AND EVACUATION N/A 10/29/2016   Procedure: DILATATION AND EVACUATION;  Surgeon: Nadara Mustard, MD;  Location: [facility] ORS;  Service: Gynecology;  Laterality: N/A;  . HERNIA REPAIR    . TONSILLECTOMY      Prior to Admission medications   Medication Sig Start Date End Date Taking? Authorizing Provider  albuterol (PROVENTIL HFA;VENTOLIN HFA) 108 (90 Base) MCG/ACT inhaler Inhale 2 puffs into the lungs every 6 (six) hours as needed for wheezing or shortness of breath. 02/15/17   Enid Derry, PA-C  azithromycin (ZITHROMAX Z-PAK) 250 MG tablet Take 2 tablets (500 mg) on  Day 1,  followed by 1 tablet (250 mg) once daily on Days 2 through 5. 02/15/17   Enid Derry, PA-C  benzonatate (TESSALON PERLES) 100 MG capsule Take 1 capsule (100 mg total) by mouth 3 (three) times daily as needed for cough. 02/15/17 02/15/18  Enid Derry, PA-C  doxycycline (VIBRAMYCIN) 100 MG capsule Take 1 capsule (100 mg total) by  mouth 2 (two) times daily. 10/29/16   Nadara Mustard, MD  methylergonovine (METHERGINE) 0.2 MG tablet Take 1 tablet (0.2 mg total) by mouth 4 (four) times daily. For Four doses to minimize bleeding 10/29/16   Nadara Mustard, MD  naproxen (NAPROSYN) 500 MG tablet Take 1 tablet (500 mg total) by mouth 2 (two) times daily as needed. 10/29/16   Nadara Mustard, MD  ondansetron (ZOFRAN ODT) 4 MG disintegrating tablet Take 1 tablet (4 mg total) by mouth every 8 (eight) hours as needed for nausea or vomiting. Patient not taking: Reported on 10/29/2016 08/23/16   Gunnar Bulla, CNM  predniSONE (DELTASONE) 10 MG tablet Take 6 tablets on day 1, take 5 tablets on day 2, take 4 tablets on day 3, take 3 tablets on day 4, take 2 tablets on day 5, take 1 tablet on day 6 02/15/17   Enid Derry, PA-C  traMADol (ULTRAM) 50 MG tablet Take 1 tablet (50 mg total) by mouth every 6 (six) hours as needed. 10/30/16   Minna Antis, MD    Allergies Pineapple; Sulfa antibiotics; Latex; Vicodin [hydrocodone-acetaminophen]; Codeine; and Penicillins  No family history on file.  Social History Social History  Substance Use Topics  . Smoking status: Former Games developer  . Smokeless tobacco: Never Used  . Alcohol use No     Review of Systems  Constitutional: No fever/chills Cardiovascular: No chest pain. Respiratory: No SOB. Positive for cough. Gastrointestinal: No abdominal pain.  No nausea, no vomiting.  Musculoskeletal: Negative for musculoskeletal  pain. Skin: Negative for rash, abrasions, lacerations, ecchymosis. Neurological: Negative for headaches, numbness or tingling   ____________________________________________   PHYSICAL EXAM:  VITAL SIGNS: ED Triage Vitals [02/15/17 1436]  Enc Vitals Group     BP 123/65     Pulse Rate 78     Resp 18     Temp 98.3 F (36.8 C)     Temp Source Oral     SpO2 99 %     Weight 280 lb (127 kg)     Height  (1.626 m)     Head Circumference       Peak Flow      Pain Score      Pain Loc      Pain Edu?      Excl. in GC?      Constitutional: Alert and oriented. Well appearing and in no acute distress. Eyes: Conjunctivae are normal. PERRL. EOMI. Head: Atraumatic. ENT:      Ears:      Nose: No congestion/rhinnorhea.      Mouth/Throat: Mucous membranes are moist.  Neck: No stridor.  Cardiovascular: Normal rate, regular rhythm.  Good peripheral circulation. Respiratory: Normal respiratory effort without tachypnea or retractions. Lungs CTAB. Good air entry to the bases with no decreased or absent breath sounds. Gastrointestinal: Bowel sounds 4 quadrants. Soft and nontender to palpation. No guarding or rigidity. No palpable masses. No distention.  Musculoskeletal: Full range of motion to all extremities. No gross deformities appreciated. Neurologic:  Normal speech and language. No gross focal neurologic deficits are appreciated.  Skin:  Skin is warm, dry and intact. No rash noted.   ____________________________________________   LABS (all labs ordered are listed, but only abnormal results are displayed)  Labs Reviewed - No data to display ____________________________________________  EKG   ____________________________________________  RADIOLOGY  No results found.  ____________________________________________    PROCEDURES  Procedure(s) performed:    Procedures    Medications  methylPREDNISolone sodium succinate (SOLU-MEDROL) 125 mg/2 mL injection 125 mg (125 mg Intramuscular Refused 02/15/17 1602)     ____________________________________________   INITIAL IMPRESSION / ASSESSMENT AND PLAN / ED COURSE  Pertinent labs & imaging results that were available during my care of the patient were reviewed by me and considered in my medical decision making (see chart for details).  Review of the [place] CSRS was performed in accordance of the [facility] prior to dispensing any controlled drugs.   Patient's diagnosis is  consistent with bronchitis. Vital signs and exam are reassuring. Patient would like to hold off on chest xray at this time. She also does not want a DuoNeb. IM solumedrol was given. Patient will be discharged home with prescriptions for prednisone, azithromycin, tessalon Perles, albuterol inhaler. Patient is to follow up with PCP as directed. Patient is given ED precautions to return to the ED for any worsening or new symptoms.     ____________________________________________  FINAL CLINICAL IMPRESSION(S) / ED DIAGNOSES  Final diagnoses:  Acute bronchitis, unspecified organism      NEW MEDICATIONS STARTED DURING THIS VISIT:  Discharge Medication List as of 02/15/2017  4:30 PM    START taking these medications   Details  albuterol (PROVENTIL HFA;VENTOLIN HFA) 108 (90 Base) MCG/ACT inhaler Inhale 2 puffs into the lungs every 6 (six) hours as needed for wheezing or shortness of breath., Starting Tue 02/15/2017, Print    azithromycin (ZITHROMAX Z-PAK) 250 MG tablet Take 2 tablets (500 mg) on  Day 1,  followed by 1 tablet (250  mg) once daily on Days 2 through 5., Print    predniSONE (DELTASONE) 10 MG tablet Take 6 tablets on day 1, take 5 tablets on day 2, take 4 tablets on day 3, take 3 tablets on day 4, take 2 tablets on day 5, take 1 tablet on day 6, Print            This chart was dictated using voice recognition software/Dragon. Despite best efforts to proofread, errors can occur which can change the meaning. Any change was purely unintentional.    Enid Derry, PA-C 02/15/17 1805    Minna Antis, MD 02/15/17 [phone]

## 2017-02-15 NOTE — ED Triage Notes (Signed)
Pt complains of cold, congestion, cough and ear pain for 2 weeks, pt denies fever

## 2017-02-15 NOTE — ED Notes (Signed)
Pt reports productive cough for the past 2 weeks. Describes the phlem as yellowish brown. Pt denies fevers but reports ears are clogged especially there right one.

## 2017-05-31 ENCOUNTER — Telehealth: Payer: Self-pay | Admitting: Certified Nurse Midwife

## 2017-05-31 ENCOUNTER — Ambulatory Visit (INDEPENDENT_AMBULATORY_CARE_PROVIDER_SITE_OTHER): Payer: 59 | Admitting: Certified Nurse Midwife

## 2017-05-31 VITALS — BP 121/88 | HR 74 | Ht 64.0 in | Wt 295.3 lb

## 2017-05-31 DIAGNOSIS — Z3046 Encounter for surveillance of implantable subdermal contraceptive: Secondary | ICD-10-CM | POA: Diagnosis not present

## 2017-05-31 NOTE — Patient Instructions (Signed)
Preventive Care 18-39 Years, Female Preventive care refers to lifestyle choices and visits with your health care provider that can promote health and wellness. What does preventive care include?  A yearly physical exam. This is also called an annual well check.  Dental exams once or twice a year.  Routine eye exams. Ask your health care provider how often you should have your eyes checked.  Personal lifestyle choices, including: ? Daily care of your teeth and gums. ? Regular physical activity. ? Eating a healthy diet. ? Avoiding tobacco and drug use. ? Limiting alcohol use. ? Practicing safe sex. ? Taking vitamin and mineral supplements as recommended by your health care provider. What happens during an annual well check? The services and screenings done by your health care provider during your annual well check will depend on your age, overall health, lifestyle risk factors, and family history of disease. Counseling Your health care provider may ask you questions about your:  Alcohol use.  Tobacco use.  Drug use.  Emotional well-being.  Home and relationship well-being.  Sexual activity.  Eating habits.  Work and work Statistician.  Method of birth control.  Menstrual cycle.  Pregnancy history.  Screening You may have the following tests or measurements:  Height, weight, and BMI.  Diabetes screening. This is done by checking your blood sugar (glucose) after you have not eaten for a while (fasting).  Blood pressure.  Lipid and cholesterol levels. These may be checked every 5 years starting at age 66.  Skin check.  Hepatitis C blood test.  Hepatitis B blood test.  Sexually transmitted disease (STD) testing.  BRCA-related cancer screening. This may be done if you have a family history of breast, ovarian, tubal, or peritoneal cancers.  Pelvic exam and Pap test. This may be done every 3 years starting at age 40. Starting at age 59, this may be done every 5  years if you have a Pap test in combination with an HPV test.  Discuss your test results, treatment options, and if necessary, the need for more tests with your health care provider. Vaccines Your health care provider may recommend certain vaccines, such as:  Influenza vaccine. This is recommended every year.  Tetanus, diphtheria, and acellular pertussis (Tdap, Td) vaccine. You may need a Td booster every 10 years.  Varicella vaccine. You may need this if you have not been vaccinated.  HPV vaccine. If you are 69 or younger, you may need three doses over 6 months.  Measles, mumps, and rubella (MMR) vaccine. You may need at least one dose of MMR. You may also need a second dose.  Pneumococcal 13-valent conjugate (PCV13) vaccine. You may need this if you have certain conditions and were not previously vaccinated.  Pneumococcal polysaccharide (PPSV23) vaccine. You may need one or two doses if you smoke cigarettes or if you have certain conditions.  Meningococcal vaccine. One dose is recommended if you are age 27-21 years and a first-year college student living in a residence hall, or if you have one of several medical conditions. You may also need additional booster doses.  Hepatitis A vaccine. You may need this if you have certain conditions or if you travel or work in places where you may be exposed to hepatitis A.  Hepatitis B vaccine. You may need this if you have certain conditions or if you travel or work in places where you may be exposed to hepatitis B.  Haemophilus influenzae type b (Hib) vaccine. You may need this if  you have certain risk factors.  Talk to your health care provider about which screenings and vaccines you need and how often you need them. This information is not intended to replace advice given to you by your health care provider. Make sure you discuss any questions you have with your health care provider. Document Released: 06/22/2001 Document Revised: 01/14/2016  Document Reviewed: 02/25/2015 Elsevier Interactive Patient Education  [employer].

## 2017-05-31 NOTE — Telephone Encounter (Signed)
The patient called asking if Marcelino DusterMichelle would be able to call in a pill/prescription for her. The patient did not disclose any other information. Please advise.

## 2017-05-31 NOTE — Progress Notes (Signed)
Pt is here for a Nexplanon removal. Does not want different birth control. C/o breakthrough bleeding.

## 2017-05-31 NOTE — Telephone Encounter (Signed)
Pt would like to try ocp. [facility]. pls advise. ty

## 2017-05-31 NOTE — Telephone Encounter (Signed)
The patient is okay with a provider or nurse leaving a detailed message since the patient is at work.

## 2017-05-31 NOTE — Progress Notes (Signed)
Sara Boyd is a 27 y.o. year old Caucasian female here for Implanon removal.  Patient given informed consent for removal of her Implanon.  BP 121/88   Pulse 74   Ht 5\' 4"  (1.626 m)   Wt 295 lb 5 oz (134 kg)   BMI 50.69 kg/m   Appropriate time out taken. Implanon site identified.  Area prepped in usual sterile fashon. One cc of 2% lidocaine was used to anesthetize the area at the distal end of the implant. A small stab incision was made right beside the implant on the distal portion.  The Implanon rod was grasped using hemostats and removed without difficulty.  There was less than 3 cc blood loss. There were no complications.  Steri-strips were applied over the small incision and a pressure bandage was applied.  The patient tolerated the procedure well.  She was instructed to keep the area clean and dry, remove pressure bandage in 24 hours, and keep insertion site covered with the steri-strip for 3-5 days.    Reviewed red flag symptoms and when to call.   Follow-up PRN problems.   Gunnar BullaJenkins Michelle Murray Durrell, CNM [facility], [facility]

## 2017-06-01 ENCOUNTER — Telehealth: Payer: Self-pay | Admitting: Certified Nurse Midwife

## 2017-06-01 NOTE — Telephone Encounter (Signed)
The patient called and stated that her nexplanon has been removed and was encouraged to start birth control (pill form) The patient was calling to check on the status of that being sent in or if an appointment is needed some time soon. No other information disclosed. Please advise.

## 2017-06-01 NOTE — Telephone Encounter (Signed)
Pt aware JML will get this message on Friday.

## 2017-06-02 MED ORDER — NORETHINDRONE 0.35 MG PO TABS: 1.0000 | ORAL_TABLET | Freq: Every day | ORAL | 3 refills | Status: DC

## 2017-06-02 NOTE — Telephone Encounter (Signed)
Pt prefers minipill. Decline depo d/t wt gain. Mirena made her have cramps. Mini pill erx.

## 2017-06-02 NOTE — Telephone Encounter (Signed)
lmtrc

## 2017-06-02 NOTE — Telephone Encounter (Signed)
Please contact patient. Willing to prescribe mini pill (Rx Camila, take one day, dispense 3, refills 4), but at current weight typical OCP may be associated with increased risk for blood clot due to estrogen and I am concerned about prescribing. Other options include depo, Mirena IUD or ParaGard IUD. Thanks, JML

## 2017-06-23 ENCOUNTER — Encounter: Payer: 59 | Admitting: Certified Nurse Midwife

## 2017-07-22 ENCOUNTER — Emergency Department
Admission: EM | Admit: 2017-07-22 | Discharge: 2017-07-22 | Disposition: A | Discharge status: Home / Self Care | Payer: 59 | Attending: Emergency Medicine | Admitting: Emergency Medicine

## 2017-07-22 ENCOUNTER — Encounter: Payer: Self-pay | Admitting: Emergency Medicine

## 2017-07-22 ENCOUNTER — Other Ambulatory Visit: Payer: Self-pay

## 2017-07-22 DIAGNOSIS — H6591 Unspecified nonsuppurative otitis media, right ear: Secondary | ICD-10-CM

## 2017-07-22 DIAGNOSIS — H9201 Otalgia, right ear: Secondary | ICD-10-CM | POA: Diagnosis present

## 2017-07-22 DIAGNOSIS — Z87891 Personal history of nicotine dependence: Secondary | ICD-10-CM | POA: Diagnosis not present

## 2017-07-22 DIAGNOSIS — Z91040 Latex allergy status: Secondary | ICD-10-CM | POA: Diagnosis not present

## 2017-07-22 DIAGNOSIS — Z79899 Other long term (current) drug therapy: Secondary | ICD-10-CM | POA: Diagnosis not present

## 2017-07-22 MED ORDER — PSEUDOEPH-BROMPHEN-DM 30-2-10 MG/5ML PO SYRP: 5.0000 mL | ORAL_SOLUTION | Freq: Four times a day (QID) | ORAL | 0 refills | Status: DC | PRN

## 2017-07-22 MED ORDER — AZITHROMYCIN 250 MG PO TABS: ORAL_TABLET | ORAL | 0 refills | Status: AC

## 2017-07-22 NOTE — ED Provider Notes (Signed)
[facility] Emergency Department Provider Note   ____________________________________________   First MD Initiated Contact with Patient 07/22/17 1250     (approximate)  I have reviewed the triage vital signs and the nursing notes.   HISTORY  Chief Complaint Sore Throat and Otalgia    HPI Sara Boyd is a 27 y.o. female patient complain of right ear pain and sore throat for 3 days.  Patient had previous sore throat was treated with clindamycin but has not improved.  Patient  ear pain has increased along along with mild hearing loss.  Patient stated voices are "muffled".  Patient rates the pain as a 6/10.  Past Medical History:  Diagnosis Date  . Medical history non-contributory     Patient Active Problem List   Diagnosis Date Noted  . Retained products of conception following abortion 10/29/2016  . Endometritis 10/21/2016    Past Surgical History:  Procedure Laterality Date  . BLADDER SURGERY    . DILATION AND CURETTAGE OF UTERUS     elective AB  . DILATION AND EVACUATION N/A 10/29/2016   Procedure: DILATATION AND EVACUATION;  Surgeon: Nadara Mustard, MD;  Location: [facility] ORS;  Service: Gynecology;  Laterality: N/A;  . HERNIA REPAIR    . TONSILLECTOMY      Prior to Admission medications   Medication Sig Start Date End Date Taking? Authorizing Provider  azithromycin (ZITHROMAX Z-PAK) 250 MG tablet Take 2 tablets (500 mg) on  Day 1,  followed by 1 tablet (250 mg) once daily on Days 2 through 5. 07/22/17 07/27/17  Joni Reining, PA-C  brompheniramine-pseudoephedrine-DM 30-2-10 MG/5ML syrup Take 5 mLs by mouth 4 (four) times daily as needed. 07/22/17   Joni Reining, PA-C  norethindrone (ORTHO MICRONOR) 0.35 MG tablet Take 1 tablet (0.35 mg total) by mouth daily. 06/02/17   Lawhorn, Vanessa Coalton, CNM  omeprazole (PRILOSEC) 10 MG capsule Take 10 mg by mouth daily.    [provider]    Allergies Pineapple; Sulfa  antibiotics; Latex; Vicodin [hydrocodone-acetaminophen]; Codeine; and Penicillins  No family history on file.  Social History Social History   Tobacco Use  . Smoking status: Former Games developer  . Smokeless tobacco: Never Used  Substance Use Topics  . Alcohol use: No  . Drug use: No    Review of Systems  Constitutional: No fever/chills Eyes: No visual changes. ENT: Right ear pain sore throat. Cardiovascular: Denies chest pain. Respiratory: Denies shortness of breath. Gastrointestinal: No abdominal pain.  No nausea, no vomiting.  No diarrhea.  No constipation. Genitourinary: Negative for dysuria. Musculoskeletal: Negative for back pain. Skin: Negative for rash. Neurological: Negative for headaches, focal weakness or numbness. Allergic/Immunilogical: See medication list.  ____________________________________________   PHYSICAL EXAM:  VITAL SIGNS: ED Triage Vitals  Enc Vitals Group     BP 07/22/17 1239 130/85     Pulse Rate 07/22/17 1239 82     Resp --      Temp 07/22/17 1239 98.4 F (36.9 C)     Temp Source 07/22/17 1239 Oral     SpO2 07/22/17 1239 96 %     Weight 07/22/17 1245 295 lb (133.8 kg)     Height 07/22/17 1245 5\' 4"  (1.626 m)     Head Circumference --      Peak Flow --      Pain Score 07/22/17 1245 6     Pain Loc --      Pain Edu? --  Excl. in GC? --    Constitutional: Alert and oriented. Well appearing and in no acute distress. Nose: Edematous nasal turbinates with bilateral maxillary guarding.  Erythematous edematous right TM.   Mouth/Throat: Mucous membranes are moist.  Oropharynx erythematous. Neck: No stridor.  No cervical spine tenderness to palpation. Hematological/Lymphatic/Immunilogical: No cervical lymphadenopathy. Cardiovascular: Normal rate, regular rhythm. Grossly normal heart sounds.  Good peripheral circulation. Respiratory: Normal respiratory effort.  No retractions. Lungs CTAB. Skin:  Skin is warm, dry and intact. No rash  noted. Psychiatric: Mood and affect are normal. Speech and behavior are normal.  ____________________________________________   LABS (all labs ordered are listed, but only abnormal results are displayed)  Labs Reviewed - No data to display ____________________________________________  EKG   ____________________________________________  RADIOLOGY  ED MD interpretation:    Official radiology report(s): No results found.  ____________________________________________   PROCEDURES  Procedure(s) performed: None  Procedures  Critical Care performed: No  ____________________________________________   INITIAL IMPRESSION / ASSESSMENT AND PLAN / ED COURSE  As part of my medical decision making, I reviewed the following data within the electronic MEDICAL RECORD NUMBER    Right otitis media and pharyngitis.  Patient given discharge care instruction work note.  Patient advised take medication as directed.  Patient advised follow-up PCP if no improvement in 3-5 days.      ____________________________________________   FINAL CLINICAL IMPRESSION(S) / ED DIAGNOSES  Final diagnoses:  Right non-suppurative otitis media     ED Discharge Orders        Ordered    azithromycin (ZITHROMAX Z-PAK) 250 MG tablet     07/22/17 1253    brompheniramine-pseudoephedrine-DM 30-2-10 MG/5ML syrup  4 times daily PRN     07/22/17 1253       Note:  This document was prepared using Dragon voice recognition software and may include unintentional dictation errors.    Joni ReiningSmith, Ronald K, PA-C 07/22/17 1302    Jene EveryKinner, Robert, MD 07/22/17 [phone]

## 2017-07-22 NOTE — ED Triage Notes (Signed)
Presents with sore throat and right ear pain   States she was seen by nurse at work and placed on clindamycin  States she has not gotten any better

## 2017-08-05 ENCOUNTER — Encounter: Payer: Self-pay | Admitting: Internal Medicine

## 2017-08-05 ENCOUNTER — Ambulatory Visit (INDEPENDENT_AMBULATORY_CARE_PROVIDER_SITE_OTHER): Payer: 59 | Admitting: Internal Medicine

## 2017-08-05 VITALS — BP 122/78 | HR 76 | Temp 98.1°F | Resp 20 | Ht 64.0 in | Wt 302.4 lb

## 2017-08-05 DIAGNOSIS — Z13220 Encounter for screening for lipoid disorders: Secondary | ICD-10-CM | POA: Diagnosis not present

## 2017-08-05 DIAGNOSIS — Z6841 Body Mass Index (BMI) 40.0 and over, adult: Secondary | ICD-10-CM | POA: Diagnosis not present

## 2017-08-05 DIAGNOSIS — M791 Myalgia, unspecified site: Secondary | ICD-10-CM | POA: Diagnosis not present

## 2017-08-05 DIAGNOSIS — Z0184 Encounter for antibody response examination: Secondary | ICD-10-CM

## 2017-08-05 DIAGNOSIS — K219 Gastro-esophageal reflux disease without esophagitis: Secondary | ICD-10-CM | POA: Diagnosis not present

## 2017-08-05 DIAGNOSIS — F419 Anxiety disorder, unspecified: Secondary | ICD-10-CM

## 2017-08-05 DIAGNOSIS — E559 Vitamin D deficiency, unspecified: Secondary | ICD-10-CM | POA: Diagnosis not present

## 2017-08-05 DIAGNOSIS — R079 Chest pain, unspecified: Secondary | ICD-10-CM

## 2017-08-05 DIAGNOSIS — Z1329 Encounter for screening for other suspected endocrine disorder: Secondary | ICD-10-CM

## 2017-08-05 DIAGNOSIS — Z1389 Encounter for screening for other disorder: Secondary | ICD-10-CM

## 2017-08-05 DIAGNOSIS — N390 Urinary tract infection, site not specified: Secondary | ICD-10-CM

## 2017-08-05 DIAGNOSIS — R739 Hyperglycemia, unspecified: Secondary | ICD-10-CM | POA: Diagnosis not present

## 2017-08-05 DIAGNOSIS — E66813 Obesity, class 3: Secondary | ICD-10-CM

## 2017-08-05 DIAGNOSIS — R002 Palpitations: Secondary | ICD-10-CM

## 2017-08-05 DIAGNOSIS — R5383 Other fatigue: Secondary | ICD-10-CM | POA: Diagnosis not present

## 2017-08-05 DIAGNOSIS — Z1159 Encounter for screening for other viral diseases: Secondary | ICD-10-CM

## 2017-08-05 LAB — COMPREHENSIVE METABOLIC PANEL
ALT: 20 U/L (ref 0–35)
AST: 15 U/L (ref 0–37)
Albumin: 3.9 g/dL (ref 3.5–5.2)
Alkaline Phosphatase: 71 U/L (ref 39–117)
BUN: 12 mg/dL (ref 6–23)
CO2: 26 mEq/L (ref 19–32)
CREATININE: 0.82 mg/dL (ref 0.40–1.20)
Calcium: 9.4 mg/dL (ref 8.4–10.5)
Chloride: 108 mEq/L (ref 96–112)
GFR: 88.56 mL/min (ref >60.00)
GLUCOSE: 123 mg/dL — AB (ref 70–99)
POTASSIUM: 4.2 meq/L (ref 3.5–5.1)
SODIUM: 140 meq/L (ref 135–145)
Total Bilirubin: 0.6 mg/dL (ref 0.2–1.2)
Total Protein: 6.6 g/dL (ref 6.0–8.3)

## 2017-08-05 LAB — URINALYSIS, ROUTINE W REFLEX MICROSCOPIC
BILIRUBIN URINE: NEGATIVE
Ketones, ur: NEGATIVE
Nitrite: NEGATIVE
Specific Gravity, Urine: >=1.03 — AB (ref 1.000–1.030)
Total Protein, Urine: NEGATIVE
Urine Glucose: NEGATIVE
Urobilinogen, UA: 0.2 (ref 0.0–1.0)
pH: 5.5 (ref 5.0–8.0)

## 2017-08-05 LAB — T4, FREE: Free T4: 0.76 ng/dL (ref 0.60–1.60)

## 2017-08-05 LAB — TSH: TSH: 1.11 u[IU]/mL (ref 0.35–4.50)

## 2017-08-05 LAB — CBC WITH DIFFERENTIAL/PLATELET
Basophils Absolute: 0 10*3/uL (ref 0.0–0.1)
Basophils Relative: 0.5 % (ref 0.0–3.0)
EOS ABS: 0.1 10*3/uL (ref 0.0–0.7)
EOS PCT: 2.5 % (ref 0.0–5.0)
HCT: 41.8 % (ref 36.0–46.0)
Hemoglobin: 14.2 g/dL (ref 12.0–15.0)
LYMPHS ABS: 1 10*3/uL (ref 0.7–4.0)
Lymphocytes Relative: 25.9 % (ref 12.0–46.0)
MCHC: 34 g/dL (ref 30.0–36.0)
MCV: 85.8 fl (ref 78.0–100.0)
MONO ABS: 0.3 10*3/uL (ref 0.1–1.0)
Monocytes Relative: 8.2 % (ref 3.0–12.0)
NEUTROS PCT: 62.9 % (ref 43.0–77.0)
Neutro Abs: 2.5 10*3/uL (ref 1.4–7.7)
Platelets: 255 10*3/uL (ref 150.0–400.0)
RBC: 4.87 Mil/uL (ref 3.87–5.11)
RDW: 14.4 % (ref 11.5–15.5)
WBC: 4 10*3/uL (ref 4.0–10.5)

## 2017-08-05 LAB — LIPID PANEL
Cholesterol: 129 mg/dL (ref 0–200)
HDL: 34.8 mg/dL — AB (ref >39.00)
LDL Cholesterol: 78 mg/dL (ref 0–99)
NonHDL: 94.15
TRIGLYCERIDES: 80 mg/dL (ref 0.0–149.0)
Total CHOL/HDL Ratio: 4
VLDL: 16 mg/dL (ref 0.0–40.0)

## 2017-08-05 LAB — HEMOGLOBIN A1C: HEMOGLOBIN A1C: 5.6 % (ref 4.6–6.5)

## 2017-08-05 LAB — VITAMIN D 25 HYDROXY (VIT D DEFICIENCY, FRACTURES): VITD: 12.38 ng/mL — ABNORMAL LOW (ref 30.00–100.00)

## 2017-08-05 MED ORDER — PANTOPRAZOLE SODIUM 20 MG PO TBEC: 20.0000 mg | DELAYED_RELEASE_TABLET | Freq: Every day | ORAL | 0 refills | Status: DC

## 2017-08-05 NOTE — Progress Notes (Signed)
Pre-visit discussion using our clinic review tool. No additional management support is needed unless otherwise documented below in the visit note.  

## 2017-08-05 NOTE — Patient Instructions (Addendum)
F/u in 2-3 months  Check my chart for labs  Try Protonix in the am   Exercising to Lose Weight Exercising can help you to lose weight. In order to lose weight through exercise, you need to do vigorous-intensity exercise. You can tell that you are exercising with vigorous intensity if you are breathing very hard and fast and cannot hold a conversation while exercising. Moderate-intensity exercise helps to maintain your current weight. You can tell that you are exercising at a moderate level if you have a higher heart rate and faster breathing, but you are still able to hold a conversation. How often should I exercise? Choose an activity that you enjoy and set realistic goals. Your health care provider can help you to make an activity plan that works for you. Exercise regularly as directed by your health care provider. This may include:  Doing resistance training twice each week, such as: ? Push-ups. ? Sit-ups. ? Lifting weights. ? Using resistance bands.  Doing a given intensity of exercise for a given amount of time. Choose from these options: ? 150 minutes of moderate-intensity exercise every week. ? 75 minutes of vigorous-intensity exercise every week. ? A mix of moderate-intensity and vigorous-intensity exercise every week.  Children, pregnant women, people who are out of shape, people who are overweight, and older adults may need to consult a health care provider for individual recommendations. If you have any sort of medical condition, be sure to consult your health care provider before starting a new exercise program. What are some activities that can help me to lose weight?  Walking at a rate of at least 4.5 miles an hour.  Jogging or running at a rate of 5 miles per hour.  Biking at a rate of at least 10 miles per hour.  Lap swimming.  Roller-skating or in-line skating.  Cross-country skiing.  Vigorous competitive sports, such as football, basketball, and  soccer.  Jumping rope.  Aerobic dancing. How can I be more active in my day-to-day activities?  Use the stairs instead of the elevator.  Take a walk during your lunch break.  If you drive, park your car farther away from work or school.  If you take public transportation, get off one stop early and walk the rest of the way.  Make all of your phone calls while standing up and walking around.  Get up, stretch, and walk around every 30 minutes throughout the day. What guidelines should I follow while exercising?  Do not exercise so much that you hurt yourself, feel dizzy, or get very short of breath.  Consult your health care provider prior to starting a new exercise program.  Wear comfortable clothes and shoes with good support.  Drink plenty of water while you exercise to prevent dehydration or heat stroke. Body water is lost during exercise and must be replaced.  Work out until you breathe faster and your heart beats faster. This information is not intended to replace advice given to you by your health care provider. Make sure you discuss any questions you have with your health care provider. Document Released: 05/29/2010 Document Revised: 10/02/2015 Document Reviewed: 09/27/2013 Elsevier Interactive Patient Education  2018 [employer]  Food Choices for Gastroesophageal Reflux Disease, Adult When you have gastroesophageal reflux disease (GERD), the foods you eat and your eating habits are very important. Choosing the right foods can help ease the discomfort of GERD. Consider working with a diet and nutrition specialist (dietitian) to help you make healthy food  choices. What general guidelines should I follow? Eating plan  Choose healthy foods low in fat, such as fruits, vegetables, whole grains, low-fat dairy products, and lean meat, fish, and poultry.  Eat frequent, small meals instead of three large meals each day. Eat your meals slowly, in a relaxed setting. Avoid  bending over or lying down until 2-3 hours after eating.  Limit high-fat foods such as fatty meats or fried foods.  Limit your intake of oils, butter, and shortening to less than 8 teaspoons each day.  Avoid the following: ? Foods that cause symptoms. These may be different for different people. Keep a food diary to keep track of foods that cause symptoms. ? Alcohol. ? Drinking large amounts of liquid with meals. ? Eating meals during the 2-3 hours before bed.  Cook foods using methods other than frying. This may include baking, grilling, or broiling. Lifestyle   Maintain a healthy weight. Ask your health care provider what weight is healthy for you. If you need to lose weight, work with your health care provider to do so safely.  Exercise for at least 30 minutes on 5 or more days each week, or as told by your health care provider.  Avoid wearing clothes that fit tightly around your waist and chest.  Do not use any products that contain nicotine or tobacco, such as cigarettes and e-cigarettes. If you need help quitting, ask your health care provider.  Sleep with the head of your bed raised. Use a wedge under the mattress or blocks under the bed frame to raise the head of the bed. What foods are not recommended? The items listed may not be a complete list. Talk with your dietitian about what dietary choices are best for you. Grains Pastries or quick breads with added fat. [place] toast. Vegetables Deep fried vegetables. [place] fries. Any vegetables prepared with added fat. Any vegetables that cause symptoms. For some people this may include tomatoes and tomato products, chili peppers, onions and garlic, and horseradish. Fruits Any fruits prepared with added fat. Any fruits that cause symptoms. For some people this may include citrus fruits, such as oranges, grapefruit, pineapple, and lemons. Meats and other protein foods High-fat meats, such as fatty beef or pork, hot dogs, ribs, ham,  sausage, salami and bacon. Fried meat or protein, including fried fish and fried chicken. Nuts and nut butters. Dairy Whole milk and chocolate milk. Sour cream. Cream. Ice cream. Cream cheese. Milk shakes. Beverages Coffee and tea, with or without caffeine. Carbonated beverages. Sodas. Energy drinks. Fruit juice made with acidic fruits (such as orange or grapefruit). Tomato juice. Alcoholic drinks. Fats and oils Butter. Margarine. Shortening. Ghee. Sweets and desserts Chocolate and cocoa. Donuts. Seasoning and other foods Pepper. Peppermint and spearmint. Any condiments, herbs, or seasonings that cause symptoms. For some people, this may include curry, hot sauce, or vinegar-based salad dressings. Summary  When you have gastroesophageal reflux disease (GERD), food and lifestyle choices are very important to help ease the discomfort of GERD.  Eat frequent, small meals instead of three large meals each day. Eat your meals slowly, in a relaxed setting. Avoid bending over or lying down until 2-3 hours after eating.  Limit high-fat foods such as fatty meat or fried foods. This information is not intended to replace advice given to you by your health care provider. Make sure you discuss any questions you have with your health care provider. Document Released: 04/26/2005 Document Revised: 04/27/2016 Document Reviewed: 04/27/2016 Elsevier Interactive Patient Education  2018 Elsevier Inc.  

## 2017-08-06 LAB — HEPATITIS B SURFACE ANTIBODY, QUANTITATIVE: Hepatitis B-Post: <5 m[IU]/mL — ABNORMAL LOW (ref >=10)

## 2017-08-07 ENCOUNTER — Other Ambulatory Visit: Payer: Self-pay | Admitting: Internal Medicine

## 2017-08-07 ENCOUNTER — Encounter: Payer: Self-pay | Admitting: Internal Medicine

## 2017-08-07 DIAGNOSIS — E559 Vitamin D deficiency, unspecified: Secondary | ICD-10-CM

## 2017-08-07 MED ORDER — CHOLECALCIFEROL 1.25 MG (50000 UT) PO CAPS: 50000.0000 [IU] | ORAL_CAPSULE | ORAL | 1 refills | Status: DC

## 2017-08-08 ENCOUNTER — Encounter: Payer: Self-pay | Admitting: Internal Medicine

## 2017-08-08 DIAGNOSIS — R079 Chest pain, unspecified: Secondary | ICD-10-CM | POA: Insufficient documentation

## 2017-08-08 DIAGNOSIS — Z6841 Body Mass Index (BMI) 40.0 and over, adult: Secondary | ICD-10-CM

## 2017-08-08 DIAGNOSIS — E669 Obesity, unspecified: Secondary | ICD-10-CM | POA: Insufficient documentation

## 2017-08-08 DIAGNOSIS — M791 Myalgia, unspecified site: Secondary | ICD-10-CM | POA: Insufficient documentation

## 2017-08-08 DIAGNOSIS — N390 Urinary tract infection, site not specified: Secondary | ICD-10-CM | POA: Insufficient documentation

## 2017-08-08 DIAGNOSIS — K219 Gastro-esophageal reflux disease without esophagitis: Secondary | ICD-10-CM | POA: Insufficient documentation

## 2017-08-08 DIAGNOSIS — R002 Palpitations: Secondary | ICD-10-CM | POA: Insufficient documentation

## 2017-08-08 DIAGNOSIS — F419 Anxiety disorder, unspecified: Secondary | ICD-10-CM | POA: Insufficient documentation

## 2017-08-08 DIAGNOSIS — R5383 Other fatigue: Secondary | ICD-10-CM | POA: Insufficient documentation

## 2017-08-08 NOTE — Progress Notes (Addendum)
Chief Complaint  Patient presents with  . Establish Care   Establish Care 1. Chronic UTI prev. Seen Dr. 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 Migraines f/u with Dr. Sherryll BurgerShah previously not currently on medications for h/as  3. ? Dx of fibromyalgia she feels like her entire body is sore  4. Anxiety since 2012 not currently on medications. Denies depression. She thinks she can control sx's  5. C/o GERD sx's was on prilosec 10 mg in the past unsure if helped 6. Obesity she reports does not exercise due to working 10-12 hour days she has treadmill at home and will try that she has reduced her food intake.   7. She reports intermittent left side chest pain radiating down her left arm and palpitations previously saw Dr. Cassie FreerParachos who did not want to do further w/u due to age but pt states her dad had MI and CABG and paternal side of family h/o MI and pGM h/o MI and wants to see a different cardiologist.   8. She reports fatigue w/o wt loss. She is always sleepy/tried she does snore unsure if she stops breathing.    Review of Systems  Constitutional: Positive for malaise/fatigue. Negative for weight loss.  HENT: Negative for hearing loss.   Eyes: Negative for blurred vision.  Respiratory: Negative for shortness of breath.   Cardiovascular: Positive for chest pain and palpitations.  Gastrointestinal: Positive for heartburn.  Genitourinary: Negative for dysuria.  Musculoskeletal: Positive for myalgias.  Skin: Negative for rash.  Neurological: Negative for headaches.  Psychiatric/Behavioral: Negative for depression. The patient is nervous/anxious.    Past Medical History:  Diagnosis Date  . Endometritis   . Medical history non-contributory   . Retained products of conception following abortion    Past Surgical History:  Procedure Laterality Date  . BLADDER SURGERY    . DILATION  AND CURETTAGE OF UTERUS     elective AB  . DILATION AND EVACUATION N/A 10/29/2016   Procedure: DILATATION AND EVACUATION;  Surgeon: Nadara MustardHarris, Robert P, MD;  Location: [facility] ORS;  Service: Gynecology;  Laterality: N/A;  . HERNIA REPAIR    . TONSILLECTOMY     No family history on file. Social History   Socioeconomic History  . Marital status: Married    Spouse name: Not on file  . Number of children: Not on file  . Years of education: Not on file  . Highest education level: Not on file  Occupational History  . Not on file  Social Needs  . Financial resource strain: Not on file  . Food insecurity:    Worry: Not on file    Inability: Not on file  . Transportation needs:    Medical: Not on file    Non-medical: Not on file  Tobacco Use  . Smoking status: Former Games developermoker  . Smokeless tobacco: Never Used  Substance and Sexual Activity  . Alcohol use: No  . Drug use: No  . Sexual activity: Yes    Birth control/protection: Pill  Lifestyle  . Physical activity:    Days per week: Not on file    Minutes per session: Not on file  . Stress: Not on file  Relationships  . Social connections:    Talks on phone: Not on file    Gets together: Not on file    Attends religious service: Not on file  Active member of club or organization: Not on file    Attends meetings of clubs or organizations: Not on file    Relationship status: Not on file  . Intimate partner violence:    Fear of current or ex partner: Not on file    Emotionally abused: Not on file    Physically abused: Not on file    Forced sexual activity: Not on file  Other Topics Concern  . Not on file  Social History Narrative  . Not on file   Current Meds  Medication Sig  . norethindrone (ORTHO MICRONOR) 0.35 MG tablet Take 1 tablet (0.35 mg total) by mouth daily.   Allergies  Allergen Reactions  . Pineapple Shortness Of Breath    Pt states her throat shuts completely. Anything pineapple, pt states it is juice, the whole  fruit, or flavoring.   . Sulfa Antibiotics Shortness Of Breath    Pt states her throat closes as well, tightness.   . Latex Itching    Pt states her throat also starts closing.   . Vicodin [Hydrocodone-Acetaminophen] Other (See Comments)    Hallucinations.   . Codeine Rash    Pt states it feels like her skin is coming off.   . Penicillins Rash    Pt states it feels like her skin is coming off.    Recent Results (from the past 2160 hour(s))  Comprehensive metabolic panel     Status: Abnormal   Collection Time: 08/05/17 10:06 AM  Result Value Ref Range   Sodium 140 135 - 145 mEq/L   Potassium 4.2 3.5 - 5.1 mEq/L   Chloride 108 96 - 112 mEq/L   CO2 26 19 - 32 mEq/L   Glucose, Bld 123 (H) 70 - 99 mg/dL   BUN 12 6 - 23 mg/dL   Creatinine, Ser 4.09 0.40 - 1.20 mg/dL   Total Bilirubin 0.6 0.2 - 1.2 mg/dL   Alkaline Phosphatase 71 39 - 117 U/L   AST 15 0 - 37 U/L   ALT 20 0 - 35 U/L   Total Protein 6.6 6.0 - 8.3 g/dL   Albumin 3.9 3.5 - 5.2 g/dL   Calcium 9.4 8.4 - 81.1 mg/dL   GFR 91.47 >82.95 mL/min  CBC with Differential/Platelet     Status: None   Collection Time: 08/05/17 10:06 AM  Result Value Ref Range   WBC 4.0 4.0 - 10.5 K/uL   RBC 4.87 3.87 - 5.11 Mil/uL   Hemoglobin 14.2 12.0 - 15.0 g/dL   HCT 62.1 30.8 - 65.7 %   MCV 85.8 78.0 - 100.0 fl   MCHC 34.0 30.0 - 36.0 g/dL   RDW 84.6 96.2 - 95.2 %   Platelets 255.0 150.0 - 400.0 K/uL   Neutrophils Relative % 62.9 43.0 - 77.0 %   Lymphocytes Relative 25.9 12.0 - 46.0 %   Monocytes Relative 8.2 3.0 - 12.0 %   Eosinophils Relative 2.5 0.0 - 5.0 %   Basophils Relative 0.5 0.0 - 3.0 %   Neutro Abs 2.5 1.4 - 7.7 K/uL   Lymphs Abs 1.0 0.7 - 4.0 K/uL   Monocytes Absolute 0.3 0.1 - 1.0 K/uL   Eosinophils Absolute 0.1 0.0 - 0.7 K/uL   Basophils Absolute 0.0 0.0 - 0.1 K/uL  Hemoglobin A1c     Status: None   Collection Time: 08/05/17 10:06 AM  Result Value Ref Range   Hgb A1c MFr Bld 5.6 4.6 - 6.5 %    Comment: Glycemic  Control Guidelines for People with Diabetes:Non Diabetic:  <6%Goal of Therapy: <7%Additional Action Suggested:  >8%   T4, free     Status: None   Collection Time: 08/05/17 10:06 AM  Result Value Ref Range   Free T4 0.76 0.60 - 1.60 ng/dL    Comment: Specimens from patients who are undergoing biotin therapy and /or ingesting biotin supplements may contain high levels of biotin.  The higher biotin concentration in these specimens interferes with this Free T4 assay.  Specimens that contain high levels  of biotin may cause false high results for this Free T4 assay.  Please interpret results in light of the total clinical presentation of the patient.    TSH     Status: None   Collection Time: 08/05/17 10:06 AM  Result Value Ref Range   TSH 1.11 0.35 - 4.50 uIU/mL  Urinalysis, Routine w reflex microscopic     Status: Abnormal   Collection Time: 08/05/17 10:06 AM  Result Value Ref Range   Color, Urine YELLOW Yellow;Lt. Yellow   APPearance CLEAR Clear   Specific Gravity, Urine >=1.030 (A) 1.000 - 1.030   pH 5.5 5.0 - 8.0   Total Protein, Urine NEGATIVE Negative   Urine Glucose NEGATIVE Negative   Ketones, ur NEGATIVE Negative   Bilirubin Urine NEGATIVE Negative   Hgb urine dipstick TRACE-INTACT (A) Negative   Urobilinogen, UA 0.2 0.0 - 1.0   Leukocytes, UA TRACE (A) Negative   Nitrite NEGATIVE Negative   WBC, UA 0-2/hpf 0-2/hpf   RBC / HPF 0-2/hpf 0-2/hpf   Squamous Epithelial / LPF Rare(0-4/hpf) Rare(0-4/hpf)   Bacteria, UA Many(>50/hpf) (A) None   Ca Oxalate Crys, UA Presence of (A) None  Lipid panel     Status: Abnormal   Collection Time: 08/05/17 10:06 AM  Result Value Ref Range   Cholesterol 129 0 - 200 mg/dL    Comment: ATP III Classification       Desirable:  < 200 mg/dL               Borderline High:  200 - 239 mg/dL          High:  > = 657 mg/dL   Triglycerides 84.6 0.0 - 149.0 mg/dL    Comment: Normal:  <962 mg/dLBorderline High:  150 - 199 mg/dL   HDL 95.28 (L) >41.32 mg/dL    VLDL 44.0 0.0 - 10.2 mg/dL   LDL Cholesterol 78 0 - 99 mg/dL   Total CHOL/HDL Ratio 4     Comment:                Men          Women1/2 Average Risk     3.4          3.3Average Risk          5.0          4.42X Average Risk          9.6          7.13X Average Risk          15.0          11.0                       NonHDL 94.15     Comment: NOTE:  Non-HDL goal should be 30 mg/dL higher than patient's LDL goal (i.e. LDL goal of < 70 mg/dL, would have non-HDL goal of < 100 mg/dL)  Hepatitis B  surface antibody     Status: Abnormal   Collection Time: 08/05/17 10:06 AM  Result Value Ref Range   Hepatitis B-Post <5 (L) > OR = 10 mIU/mL    Comment: . Patient does not have immunity to hepatitis B virus. . For additional information, please refer to http://education.[facility].com/faq/FAQ105 (This link is being provided for informational/ educational purposes only).   Vitamin D (25 hydroxy)     Status: Abnormal   Collection Time: 08/05/17 10:06 AM  Result Value Ref Range   VITD 12.38 (L) 30.00 - 100.00 ng/mL   Objective  Body mass index is 51.9 kg/m. Wt Readings from Last 3 Encounters:  08/05/17 (!) 302 lb 6 oz (137.2 kg)  07/22/17 295 lb (133.8 kg)  05/31/17 295 lb 5 oz (134 kg)   Temp Readings from Last 3 Encounters:  08/05/17 98.1 F (36.7 C) (Oral)  07/22/17 98.4 F (36.9 C) (Oral)  02/15/17 98.3 F (36.8 C) (Oral)   BP Readings from Last 3 Encounters:  08/05/17 122/78  07/22/17 130/85  05/31/17 121/88   Pulse Readings from Last 3 Encounters:  08/05/17 76  07/22/17 82  05/31/17 74    Physical Exam  Constitutional: She is oriented to person, place, and time and well-developed, well-nourished, and in no distress. Vital signs are normal.  HENT:  Head: Normocephalic and atraumatic.  Mouth/Throat: Oropharynx is clear and moist and mucous membranes are normal.  Eyes: Pupils are equal, round, and reactive to light. Conjunctivae are normal.  Cardiovascular: Normal  rate, regular rhythm and normal heart sounds.  Pulmonary/Chest: Effort normal and breath sounds normal.  Abdominal: Soft. Bowel sounds are normal. There is no tenderness.  Neurological: She is alert and oriented to person, place, and time. Gait normal. Gait normal.  Skin: Skin is warm, dry and intact.  Psychiatric: Mood, memory, affect and judgment normal.  Nursing note and vitals reviewed.   Assessment   1. Chronic UTI 2. Diffuse body pain ? Fibromyalgia  3. Anxiety  4. GERD  5. Obesity BMI >50  6. Chest pain and palpitations ? Etiology r/o cardiac vs GI vs other I.e anxiety FH +cardiac disease  7. Fatigue  8. HM Plan   1. Refer to urology further w/u and tx  UA today  2. Consider tx I.e cymbalta  Check labs 1st  3. See above #2  4. Start protonix  5. rec exercise and healthy diet choices  6. Refer to cards for further w/u Dr. Mariah Milling does not want to see Dr. Cassie Freer again reviewed prior cards notes  7. W/u with labs  Assess again at f/u to see if see has apnea during episodes of snoring  rec exercise  8.  Declines flu shot  Tdap had 12/2008  Check labs CMET, CBC, A1C, TSH, T4, UA, hep B titer, vit D conside rcheck lipid in future Declines STD check  Pap had 2018 [facility] or [facility] no h/o abnormal need to get copy or report   LMP 07/26/17 Nexplanon removed 2 months ago   Pt had ED visit [facility] 08/29/17 dizziness CT head ordered, labs EKG see note   Provider: Dr. French Ana McLean-Scocuzza-[facility]

## 2017-08-22 ENCOUNTER — Ambulatory Visit: Payer: 59 | Admitting: Urology

## 2017-09-06 ENCOUNTER — Ambulatory Visit: Payer: 59 | Admitting: Urology

## 2017-09-24 NOTE — Progress Notes (Deleted)
Cardiology Office Note  Date:  09/24/2017   ID:  Sara Boyd, DOB [birth year], MRN [id number]  PCP:  McLean-Scocuzza, Pasty Spillers, MD   No chief complaint on file.   HPI:   Morbid obesity Chronic UTI Referred by Dr. Judie Grieve for chest pain and palpitations   intermittent left side chest pain radiating down her left arm and palpitations previously saw Dr. Cassie Freer who did not want to do further w/u due to age but pt states her dad had MI and CABG and paternal side of family h/o MI and pGM h/o MI and wants to see a different cardiologist.    . ETT 03/11/2014 did not reveal evidence for ischemia. Echocardiogram 03/11/2014 revealed normal left ventricular function with LV ejection fraction greater than 55%. ETT sestamibi study was performed 07/12/2014 for persistent chest pain which revealed mild reversible anterior wall defect. Cardiac catheterization was deferred. low risk for significant coronary artery disease based on her age and risk factors.        EGD on 08/20/2014 which revealed mild esophagitis.      PMH:   has a past medical history of Chicken pox, Endometritis, GERD (gastroesophageal reflux disease), Headache, Medical history non-contributory, Migraine, Retained products of conception following abortion, and UTI (urinary tract infection).  PSH:    Past Surgical History:  Procedure Laterality Date  . BLADDER SURGERY    . DILATION AND CURETTAGE OF UTERUS     elective AB  . DILATION AND EVACUATION N/A 10/29/2016   Procedure: DILATATION AND EVACUATION;  Surgeon: Nadara Mustard, MD;  Location: [facility] ORS;  Service: Gynecology;  Laterality: N/A;  . HERNIA REPAIR    . HERNIA REPAIR     2011  . TONSILLECTOMY     1998    Current Outpatient Medications  Medication Sig Dispense Refill  . brompheniramine-pseudoephedrine-DM 30-2-10 MG/5ML syrup Take 5 mLs by mouth 4 (four) times daily as needed. (Patient not taking: Reported on 08/05/2017) 120 mL 0  .  Cholecalciferol 50000 units capsule Take 1 capsule (50,000 Units total) by mouth once a week. 13 capsule 1  . norethindrone (ORTHO MICRONOR) 0.35 MG tablet Take 1 tablet (0.35 mg total) by mouth daily. 3 Package 3  . pantoprazole (PROTONIX) 20 MG tablet Take 1 tablet (20 mg total) by mouth daily. In am 30 minutes before food 90 tablet 0   No current facility-administered medications for this visit.      Allergies:   Pineapple; Sulfa antibiotics; Latex; Vicodin [hydrocodone-acetaminophen]; Codeine; and Penicillins   Social History:  The patient  reports that she has quit smoking. She has never used smokeless tobacco. She reports that she does not drink alcohol or use drugs.   Family History:   family history includes COPD in her father; Cancer in her mother and paternal grandmother; Diabetes in her maternal grandmother; Early death in her paternal grandfather; Heart disease in her father, maternal grandfather, and paternal grandfather; Hyperlipidemia in her father and mother; Hypertension in her father and mother.    Review of Systems: ROS   PHYSICAL EXAM: VS:  There were no vitals taken for this visit. , BMI There is no height or weight on file to calculate BMI. GEN: Well nourished, well developed, in no acute distress  HEENT: normal  Neck: no JVD, carotid bruits, or masses Cardiac: RRR; no murmurs, rubs, or gallops,no edema  Respiratory:  clear to auscultation bilaterally, normal work of breathing GI: soft, nontender, nondistended, + BS MS: no deformity or atrophy  Skin:  warm and dry, no rash Neuro:  Strength and sensation are intact Psych: euthymic mood, full affect    Recent Labs: 08/05/2017: ALT 20; BUN 12; Creatinine, Ser 0.82; Hemoglobin 14.2; Platelets 255.0; Potassium 4.2; Sodium 140; TSH 1.11    Lipid Panel Lab Results  Component Value Date   CHOL 129 08/05/2017   HDL 34.80 (L) 08/05/2017   LDLCALC 78 08/05/2017   TRIG 80.0 08/05/2017      Wt Readings from Last  3 Encounters:  08/05/17 (!) 302 lb 6 oz (137.2 kg)  07/22/17 295 lb (133.8 kg)  05/31/17 295 lb 5 oz (134 kg)       ASSESSMENT AND PLAN:  No diagnosis found.   Disposition:   F/U  6 months  No orders of the defined types were placed in this encounter.    Signed, Dossie Arbour, M.D., Ph.D. 09/24/2017  [facility] [place], [place] [phone]

## 2017-09-26 ENCOUNTER — Ambulatory Visit: Payer: 59 | Admitting: Cardiovascular Disease

## 2017-09-29 ENCOUNTER — Encounter: Payer: Self-pay | Admitting: Cardiovascular Disease

## 2017-10-05 DIAGNOSIS — J029 Acute pharyngitis, unspecified: Secondary | ICD-10-CM | POA: Diagnosis not present

## 2017-10-06 ENCOUNTER — Ambulatory Visit: Payer: 59 | Admitting: Internal Medicine

## 2017-10-06 DIAGNOSIS — Z0289 Encounter for other administrative examinations: Secondary | ICD-10-CM

## 2017-10-07 IMAGING — CT CT ANKLE*L* W/O CM
3 series · 16 of 33 positions shown, 19 images · non-contrast
Comparison: Radiographs 03/29/2016

CLINICAL DATA: Injured ankle 03/29/2016.  Abnormal x-rays.

EXAM:
CT OF THE LEFT ANKLE WITHOUT CONTRAST
TECHNIQUE: Multidetector CT imaging of the left ankle was performed according
to the standard protocol. Multiplanar CT image reconstructions were
also generated.

[Series 3: axial soft tissue · axial · 0.34mm/px · z∈[-74,+32]mm · 8 of 63 slices shown, 10 images]
[im 5/63  soft-tissue]
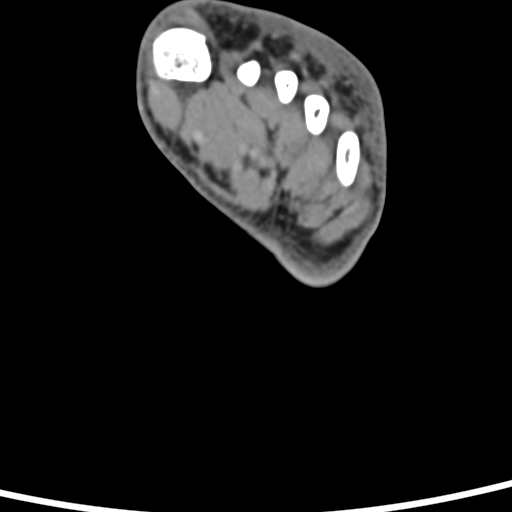
[im 5/63  bone]
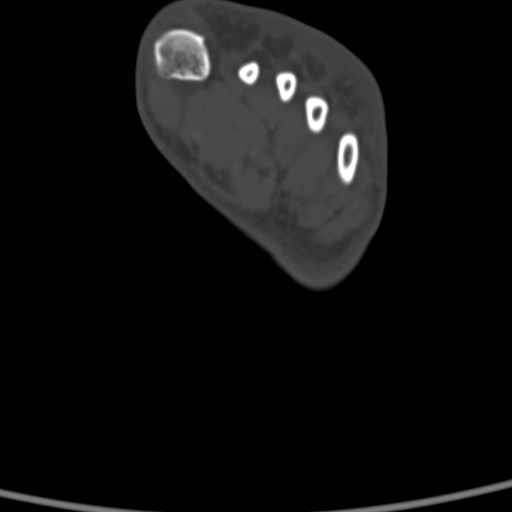
[im 15/63  bone]
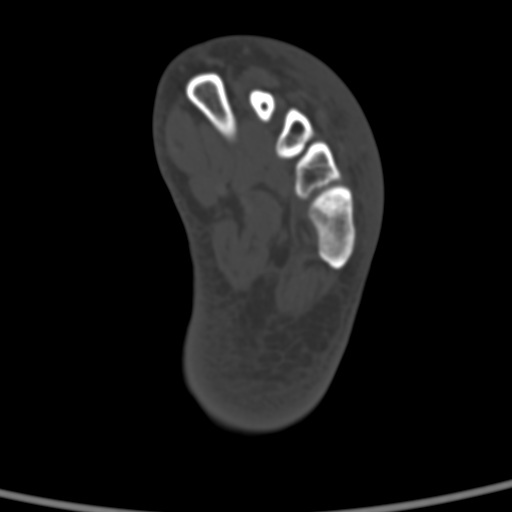
[im 20/63  bone]
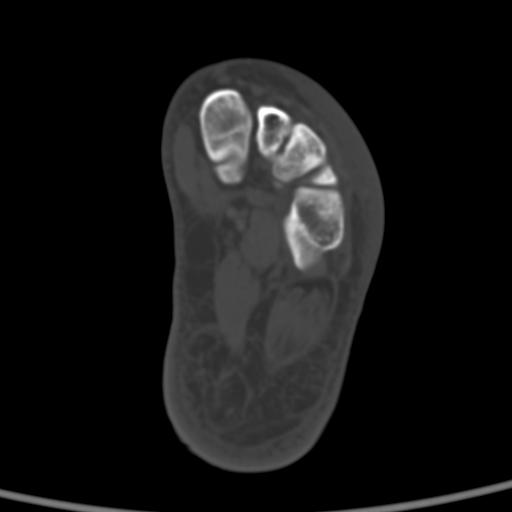
[im 29/63  bone]
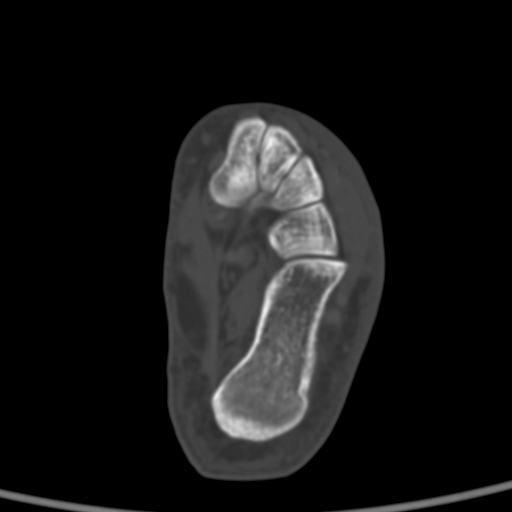
[im 34/63  soft-tissue]
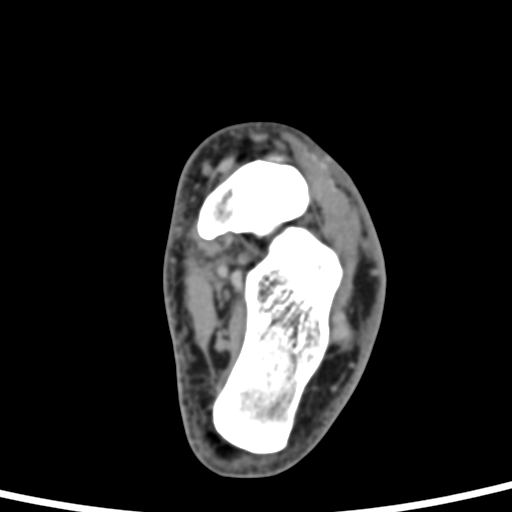
[im 34/63  bone]
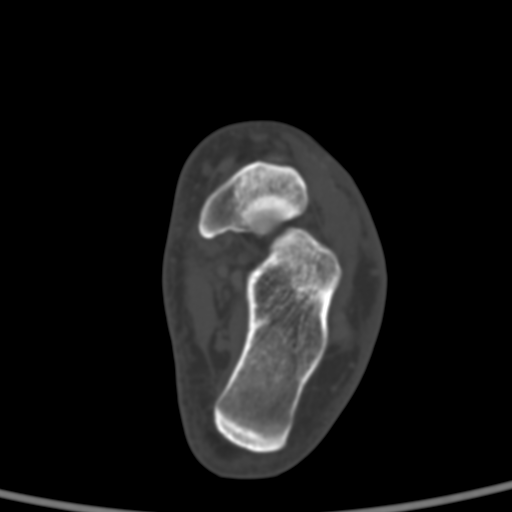
[im 43/63  bone]
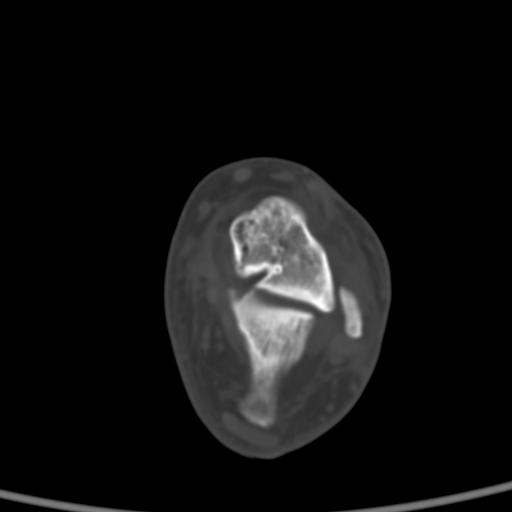
[im 48/63  bone]
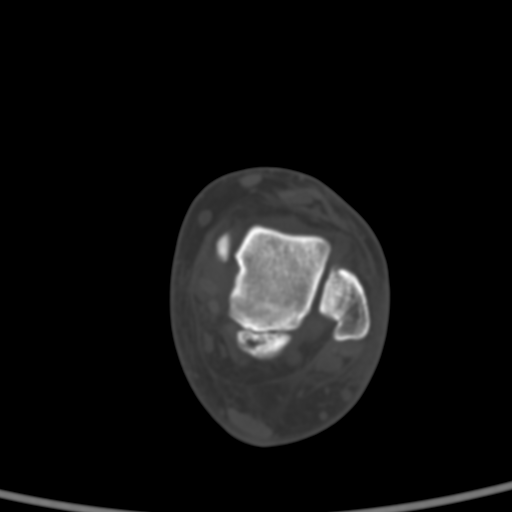
[im 58/63  bone]
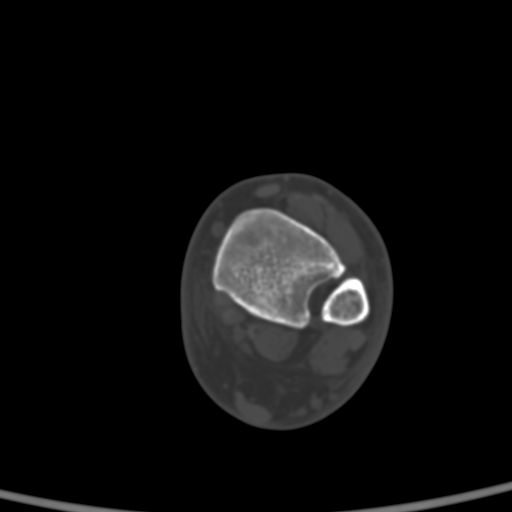

[Series 602: soft coronal · coronal · 0.34mm/px · 3 of 101 slices shown]
[im 21/101  bone]
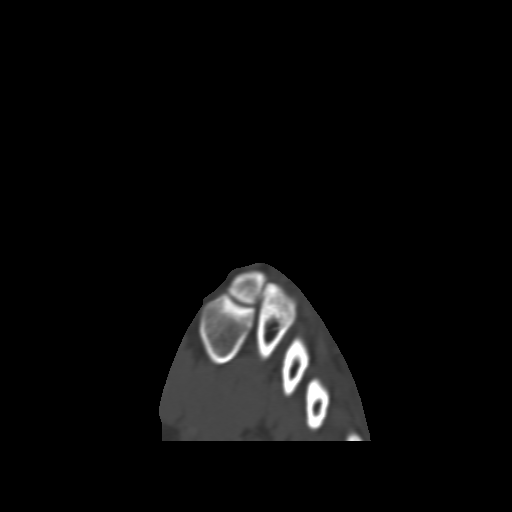
[im 41/101  bone]
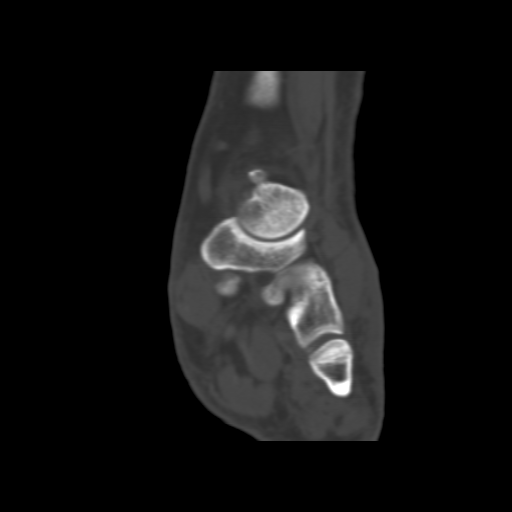
[im 61/101  bone]
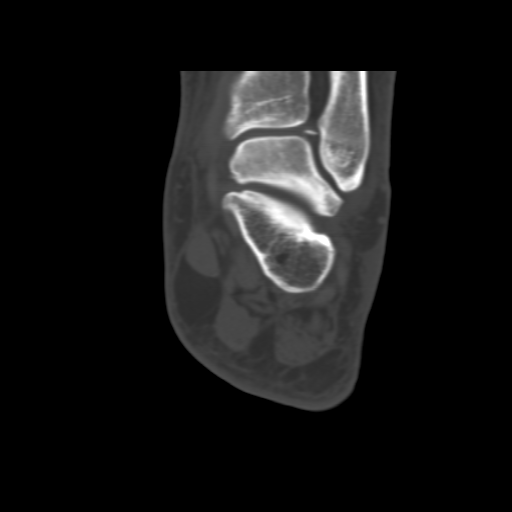

[Series 606: sag soft · sagittal · 0.34mm/px · 5 of 67 slices shown, 6 images]
[im 23/67  bone]
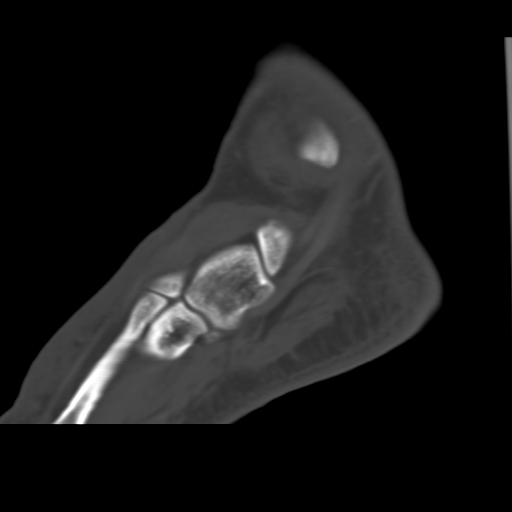
[im 28/67  bone]
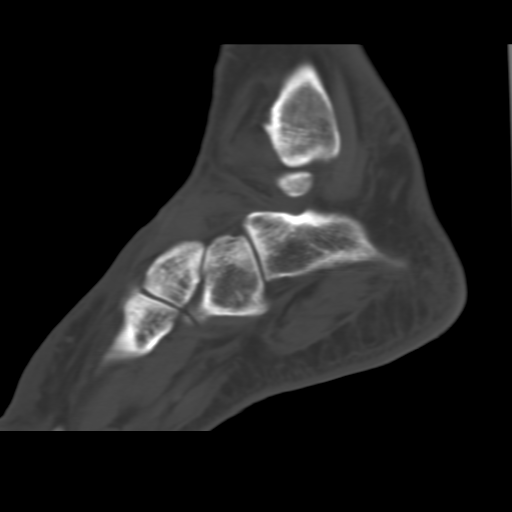
[im 34/67  soft-tissue]
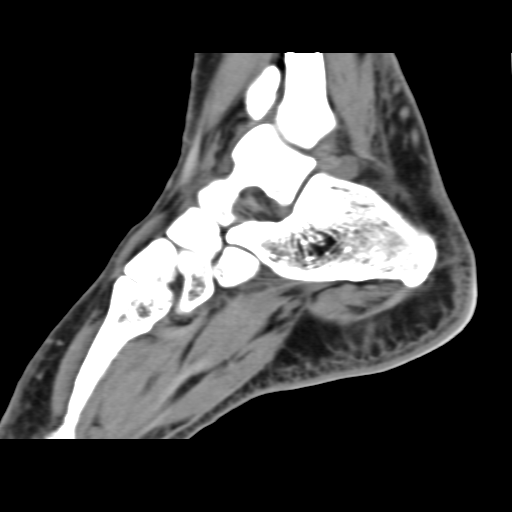
[im 34/67  bone]
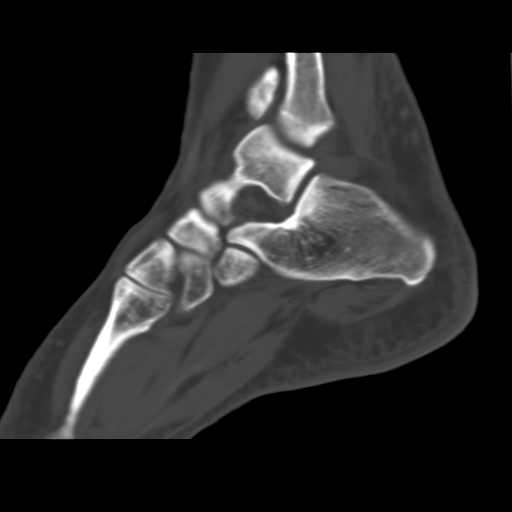
[im 39/67  bone]
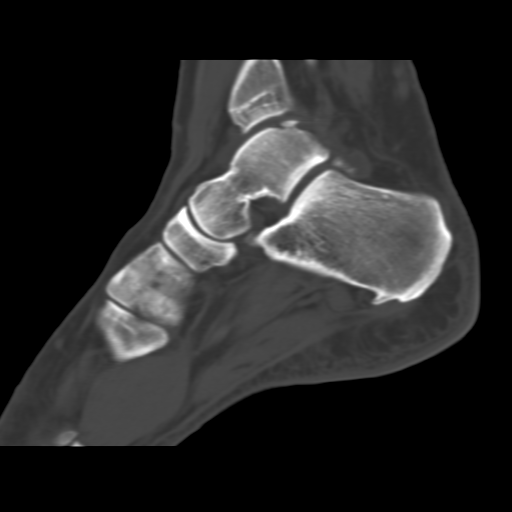
[im 45/67  bone]
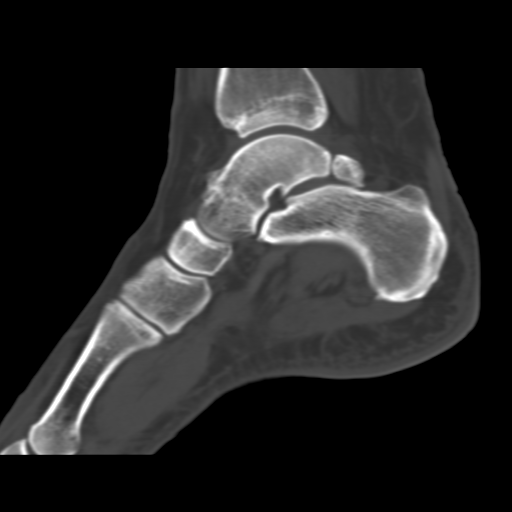

[16 of 33 positions shown; findings below may reference images not displayed]

FINDINGS: Acute osteochondral fracture for fractured osteochondral lesion
involving the lateral talar dome. Multiple small free fragments. The
largest measures 6 mm. There is an associated small ankle joint
effusion. Large os trigonum is noted. The lateral ankle mortise is
slightly widened. Possible prior high ankle sprain. Suspect prior
avulsion injury involving the anterior tibiofibular ligament with a
small bony fragment noted projecting anteriorly off of the fibula.

The subtalar joints are maintained. Moderate Unocera Luhan.
Moderate-sized calcaneal heel spur.
IMPRESSION: 1. Fractured osteochondral lesion involving the lateral talar dome
with fragmentation.
2. Suspect remote high ankle sprain.
3. Large os trigonum.

## 2017-10-12 DIAGNOSIS — R110 Nausea: Secondary | ICD-10-CM | POA: Diagnosis not present

## 2017-10-12 DIAGNOSIS — M79602 Pain in left arm: Secondary | ICD-10-CM | POA: Diagnosis not present

## 2017-10-12 DIAGNOSIS — R079 Chest pain, unspecified: Secondary | ICD-10-CM | POA: Diagnosis not present

## 2017-10-12 DIAGNOSIS — R0789 Other chest pain: Secondary | ICD-10-CM | POA: Diagnosis not present

## 2017-10-12 DIAGNOSIS — M79601 Pain in right arm: Secondary | ICD-10-CM | POA: Diagnosis not present

## 2017-10-13 DIAGNOSIS — R079 Chest pain, unspecified: Secondary | ICD-10-CM | POA: Diagnosis not present

## 2017-10-18 ENCOUNTER — Encounter: Payer: Self-pay | Admitting: Internal Medicine

## 2017-10-18 ENCOUNTER — Ambulatory Visit: Payer: 59 | Admitting: Internal Medicine

## 2017-10-18 ENCOUNTER — Other Ambulatory Visit: Payer: Self-pay | Admitting: Internal Medicine

## 2017-10-18 DIAGNOSIS — R079 Chest pain, unspecified: Secondary | ICD-10-CM

## 2017-10-18 DIAGNOSIS — R002 Palpitations: Secondary | ICD-10-CM

## 2017-10-19 ENCOUNTER — Encounter (INDEPENDENT_AMBULATORY_CARE_PROVIDER_SITE_OTHER): Payer: Self-pay

## 2017-10-24 ENCOUNTER — Encounter: Payer: 59 | Admitting: Certified Nurse Midwife

## 2017-11-28 NOTE — Progress Notes (Deleted)
Cardiology Office Note  Date:  11/28/2017   ID:  Sara Boyd, DOB [birth year], MRN [id number]  PCP:  McLean-Scocuzza, Pasty Spillersracy N, MD   No chief complaint on file.   HPI:    History of Depression Erosive esophagitis Excessive compulsive disorder Referred by Dr. Ronald Pippinsracey McLean Scocuzza for chest pain and palpitations  [facility] Seen which is 27 years old for chest pain Routine treadmill study November 2015 was normal Echocardiogram November 2015 normal LV function greater than 55% Did a nuclear stress test March 2016 for persistent chest wall pain, Mild reversible anterior wall defect No cardiac catheterization Was doing better, and had recurrent mid scapular pain with radiation to left jaw arm Presenting with exertion went to the emergency room She was given NSAIDs and muscle relaxer medication for her symptoms Felt to be muscle skeletal in nature  Has had previous GI workup April 2016 revealed mild esophagitis  Family history of heart disease Chest pain radiating to the left arm Palpitations     PMH:   has a past medical history of Chicken pox, Endometritis, GERD (gastroesophageal reflux disease), Headache, Medical history non-contributory, Migraine, Retained products of conception following abortion, and UTI (urinary tract infection).  PSH:    Past Surgical History:  Procedure Laterality Date  . BLADDER SURGERY    . DILATION AND CURETTAGE OF UTERUS     elective AB  . DILATION AND EVACUATION N/A 10/29/2016   Procedure: DILATATION AND EVACUATION;  Surgeon: Nadara MustardHarris, Robert P, MD;  Location: [facility] ORS;  Service: Gynecology;  Laterality: N/A;  . HERNIA REPAIR    . HERNIA REPAIR     2011  . TONSILLECTOMY     1998    Current Outpatient Medications  Medication Sig Dispense Refill  . brompheniramine-pseudoephedrine-DM 30-2-10 MG/5ML syrup Take 5 mLs by mouth 4 (four) times daily as needed. (Patient not taking: Reported on  08/05/2017) 120 mL 0  . Cholecalciferol 50000 units capsule Take 1 capsule (50,000 Units total) by mouth once a week. 13 capsule 1  . norethindrone (ORTHO MICRONOR) 0.35 MG tablet Take 1 tablet (0.35 mg total) by mouth daily. 3 Package 3  . pantoprazole (PROTONIX) 20 MG tablet Take 1 tablet (20 mg total) by mouth daily. In am 30 minutes before food 90 tablet 0   No current facility-administered medications for this visit.      Allergies:   Pineapple; Sulfa antibiotics; Latex; Vicodin [hydrocodone-acetaminophen]; Codeine; and Penicillins   Social History:  The patient  reports that she has quit smoking. She has never used smokeless tobacco. She reports that she does not drink alcohol or use drugs.   Family History:   family history includes COPD in her father; Cancer in her mother and paternal grandmother; Diabetes in her maternal grandmother; Early death in her paternal grandfather; Heart disease in her father, maternal grandfather, and paternal grandfather; Hyperlipidemia in her father and mother; Hypertension in her father and mother.    Review of Systems: ROS   PHYSICAL EXAM: VS:  There were no vitals taken for this visit. , BMI There is no height or weight on file to calculate BMI. GEN: Well nourished, well developed, in no acute distress  HEENT: normal  Neck: no JVD, carotid bruits, or masses Cardiac: RRR; no murmurs, rubs, or gallops,no edema  Respiratory:  clear to auscultation bilaterally, normal work of breathing GI: soft, nontender, nondistended, + BS MS: no deformity or atrophy  Skin: warm and dry, no rash Neuro:  Strength and sensation are intact Psych: euthymic mood, full affect    Recent Labs: 08/05/2017: ALT 20; BUN 12; Creatinine, Ser 0.82; Hemoglobin 14.2; Platelets 255.0; Potassium 4.2; Sodium 140; TSH 1.11    Lipid Panel Lab Results  Component Value Date   CHOL 129 08/05/2017   HDL 34.80 (L) 08/05/2017   LDLCALC 78 08/05/2017   TRIG 80.0 08/05/2017       Wt Readings from Last 3 Encounters:  08/05/17 (!) 302 lb 6 oz (137.2 kg)  07/22/17 295 lb (133.8 kg)  05/31/17 295 lb 5 oz (134 kg)       ASSESSMENT AND PLAN:  No diagnosis found.   Disposition:   F/U  6 months  No orders of the defined types were placed in this encounter.    Signed, Dossie Arbour, M.D., Ph.D. 11/28/2017  [facility] [place], [place] [phone]

## 2017-11-29 ENCOUNTER — Ambulatory Visit: Payer: Self-pay | Admitting: Cardiovascular Disease

## 2017-11-29 ENCOUNTER — Telehealth: Payer: Self-pay | Admitting: Internal Medicine

## 2017-11-29 NOTE — Telephone Encounter (Signed)
Lots of no shows and cancellations from this new patient  This is second no-show  Would not rebook  She should follow-up with primary care   Per Cardiology Dr. Mariah MillingGollan

## 2017-12-01 ENCOUNTER — Encounter: Payer: Self-pay | Admitting: Cardiovascular Disease

## 2017-12-19 ENCOUNTER — Encounter: Payer: Self-pay | Admitting: Certified Nurse Midwife

## 2017-12-20 NOTE — Telephone Encounter (Signed)
Patient may schedule beta if concerned for pregnancy. Thanks, JML

## 2017-12-27 ENCOUNTER — Encounter: Payer: Self-pay | Admitting: Emergency Medicine

## 2017-12-27 ENCOUNTER — Emergency Department
Admission: EM | Admit: 2017-12-27 | Discharge: 2017-12-27 | Discharge status: Against Medical Advice | Payer: 59 | Attending: Emergency Medicine | Admitting: Emergency Medicine

## 2017-12-27 ENCOUNTER — Other Ambulatory Visit: Payer: Self-pay

## 2017-12-27 DIAGNOSIS — R1031 Right lower quadrant pain: Secondary | ICD-10-CM | POA: Diagnosis not present

## 2017-12-27 DIAGNOSIS — Z91040 Latex allergy status: Secondary | ICD-10-CM | POA: Insufficient documentation

## 2017-12-27 DIAGNOSIS — Z3202 Encounter for pregnancy test, result negative: Secondary | ICD-10-CM | POA: Insufficient documentation

## 2017-12-27 DIAGNOSIS — Z87891 Personal history of nicotine dependence: Secondary | ICD-10-CM | POA: Diagnosis not present

## 2017-12-27 DIAGNOSIS — N912 Amenorrhea, unspecified: Secondary | ICD-10-CM | POA: Diagnosis not present

## 2017-12-27 DIAGNOSIS — R110 Nausea: Secondary | ICD-10-CM

## 2017-12-27 DIAGNOSIS — Z79899 Other long term (current) drug therapy: Secondary | ICD-10-CM | POA: Diagnosis not present

## 2017-12-27 DIAGNOSIS — R102 Pelvic and perineal pain: Secondary | ICD-10-CM | POA: Diagnosis present

## 2017-12-27 LAB — URINALYSIS, COMPLETE (UACMP) WITH MICROSCOPIC
BILIRUBIN URINE: NEGATIVE
Glucose, UA: NEGATIVE mg/dL
Hgb urine dipstick: NEGATIVE
KETONES UR: NEGATIVE mg/dL
Leukocytes, UA: NEGATIVE
Nitrite: NEGATIVE
PROTEIN: NEGATIVE mg/dL
Specific Gravity, Urine: 1.02 (ref 1.005–1.030)
pH: 6 (ref 5.0–8.0)

## 2017-12-27 LAB — HCG, QUANTITATIVE, PREGNANCY: hCG, Beta Chain, Quant, S: <1 m[IU]/mL (ref <5)

## 2017-12-27 LAB — POCT PREGNANCY, URINE: Preg Test, Ur: NEGATIVE

## 2017-12-27 NOTE — ED Triage Notes (Signed)
PT to ED via POV with c/o pelvic pain and nausea xfew weeks. Pt states LMP was x462months but neg preg test. VSS, NAD noted.

## 2017-12-27 NOTE — ED Notes (Signed)
Pt refuses to have " a bunch of stuff done' [facility], per OB she needs a beta drawn to confirm due to neg hcg test.

## 2017-12-27 NOTE — ED Notes (Signed)
Beta order given per MD Alphonzo LemmingsMcshane

## 2017-12-27 NOTE — ED Provider Notes (Signed)
[facility] Emergency Department Provider Note  ____________________________________________  Time seen: Approximately 8:43 PM  I have reviewed the triage vital signs and the nursing notes.   HISTORY  Chief Complaint Pelvic Pain   HPI Sara Boyd is a 27 y.o. female with a history of endometritis, GERD, and recurrent UTIs who presents for evaluation of abdominal pain.  Patient reports that she has not had her period for 2 months.  She also has had several weeks of daily nausea.  She took a pregnancy test at home which was negative.  3 days ago she started having right lower pelvic pain that she describes as dull, mild, constant and nonradiating.  She called her OB today who recommended that she came into the office however since patient is in a new job and is undergoing training during the day, she is unable to go see her OB/GYN and therefore came into the ED after work for evaluation.  Patient reports that she initially thought she had a UTI but when her period was late she became more concerned.  She has had several UTIs in the past.  She denies flank pain or fever, she denies vomiting, diarrhea or constipation.   Past Medical History:  Diagnosis Date  . Chicken pox   . Endometritis   . GERD (gastroesophageal reflux disease)   . Headache   . Medical history non-contributory   . Migraine   . Retained products of conception following abortion   . UTI (urinary tract infection)     Patient Active Problem List   Diagnosis Date Noted  . Chronic UTI 08/08/2017  . Chest pain 08/08/2017  . Anxiety 08/08/2017  . Myalgia 08/08/2017  . Gastroesophageal reflux disease 08/08/2017  . Class 3 severe obesity due to excess calories with body mass index (BMI) of 50.0 to 59.9 in adult (HCC) 08/08/2017  . Palpitations 08/08/2017  . Fatigue 08/08/2017  . Vitamin D deficiency 08/07/2017    Past Surgical History:  Procedure Laterality Date  . BLADDER  SURGERY    . DILATION AND CURETTAGE OF UTERUS     elective AB  . DILATION AND EVACUATION N/A 10/29/2016   Procedure: DILATATION AND EVACUATION;  Surgeon: Nadara MustardHarris, Robert P, MD;  Location: [facility] ORS;  Service: Gynecology;  Laterality: N/A;  . HERNIA REPAIR    . HERNIA REPAIR     2011  . TONSILLECTOMY     1998    Prior to Admission medications   Medication Sig Start Date End Date Taking? Authorizing Provider  brompheniramine-pseudoephedrine-DM 30-2-10 MG/5ML syrup Take 5 mLs by mouth 4 (four) times daily as needed. Patient not taking: Reported on 08/05/2017 07/22/17   Joni ReiningSmith, Ronald K, PA-C  Cholecalciferol 50000 units capsule Take 1 capsule (50,000 Units total) by mouth once a week. 08/07/17   McLean-Scocuzza, Pasty Spillersracy N, MD  norethindrone (ORTHO MICRONOR) 0.35 MG tablet Take 1 tablet (0.35 mg total) by mouth daily. 06/02/17   Lawhorn, Vanessa DurhamJenkins Michelle, CNM  pantoprazole (PROTONIX) 20 MG tablet Take 1 tablet (20 mg total) by mouth daily. In am 30 minutes before food 08/05/17   McLean-Scocuzza, Pasty Spillersracy N, MD    Allergies Pineapple; Sulfa antibiotics; Latex; Vicodin [hydrocodone-acetaminophen]; Codeine; and Penicillins  Family History  Problem Relation Age of Onset  . Cancer Mother        NHL  . Hyperlipidemia Mother   . Hypertension Mother   . COPD Father   . Heart disease Father   . Hypertension Father   .  Hyperlipidemia Father   . Diabetes Maternal Grandmother   . Heart disease Maternal Grandfather   . Cancer Paternal Grandmother        breast  . Early death Paternal Grandfather   . Heart disease Paternal Grandfather     Social History Social History   Tobacco Use  . Smoking status: Former Games developermoker  . Smokeless tobacco: Never Used  Substance Use Topics  . Alcohol use: No  . Drug use: No    Review of Systems  Constitutional: Negative for fever. Eyes: Negative for visual changes. ENT: Negative for sore throat. Neck: No neck pain  Cardiovascular: Negative for chest  pain. Respiratory: Negative for shortness of breath. Gastrointestinal: + R lower pelvic abdominal pain and nausea. No vomiting or diarrhea. Genitourinary: Negative for dysuria. + amenorrhea Musculoskeletal: Negative for back pain. Skin: Negative for rash. Neurological: Negative for headaches, weakness or numbness. Psych: No SI or HI  ____________________________________________   PHYSICAL EXAM:  VITAL SIGNS: ED Triage Vitals  Enc Vitals Group     BP 12/27/17 1744 (!) 145/85     Pulse Rate 12/27/17 1744 74     Resp 12/27/17 1744 16     Temp 12/27/17 1744 99.7 F (37.6 C)     Temp Source 12/27/17 1744 Oral     SpO2 12/27/17 1744 99 %     Weight 12/27/17 1745 (!) 305 lb (138.3 kg)     Height 12/27/17 1745 5\' 4"  (1.626 m)     Head Circumference --      Peak Flow --      Pain Score 12/27/17 1744 5     Pain Loc --      Pain Edu? --      Excl. in GC? --     Constitutional: Alert and oriented. Well appearing and in no apparent distress. HEENT:      Head: Normocephalic and atraumatic.         Eyes: Conjunctivae are normal. Sclera is non-icteric.       Mouth/Throat: Mucous membranes are moist.       Neck: Supple with no signs of meningismus. Cardiovascular: Regular rate and rhythm. No murmurs, gallops, or rubs. 2+ symmetrical distal pulses are present in all extremities. No JVD. Respiratory: Normal respiratory effort. Lungs are clear to auscultation bilaterally. No wheezes, crackles, or rhonchi.  Gastrointestinal: Soft, mild right lower pelvic region tenderness, and non distended with positive bowel sounds. No rebound or guarding. Genitourinary: No CVA tenderness.  Musculoskeletal: Nontender with normal range of motion in all extremities. No edema, cyanosis, or erythema of extremities. Neurologic: Normal speech and language. Face is symmetric. Moving all extremities. No gross focal neurologic deficits are appreciated. Skin: Skin is warm, dry and intact. No rash  noted. Psychiatric: Mood and affect are normal. Speech and behavior are normal.  ____________________________________________   LABS (all labs ordered are listed, but only abnormal results are displayed)  Labs Reviewed  URINALYSIS, COMPLETE (UACMP) WITH MICROSCOPIC - Abnormal; Notable for the following components:      Result Value   Color, Urine YELLOW (*)    APPearance CLEAR (*)    Bacteria, UA MANY (*)    All other components within normal limits  WET PREP, GENITAL  CHLAMYDIA/NGC RT PCR ([facility] ONLY)  HCG, QUANTITATIVE, PREGNANCY  CBC WITH DIFFERENTIAL/PLATELET  BASIC METABOLIC PANEL  POC URINE PREG, ED  POCT PREGNANCY, URINE   ____________________________________________  EKG  none  ____________________________________________  RADIOLOGY  none ____________________________________________   PROCEDURES  Procedure(s)  performed: None Procedures Critical Care performed:  None ____________________________________________   INITIAL IMPRESSION / ASSESSMENT AND PLAN / ED COURSE  27 y.o. female with a history of endometritis, GERD, and recurrent UTIs who presents for evaluation of R lower abdominal/pelvic pain x3 days and several weeks of nausea and 2 months of amenorrhea. Ddx pregnancy, ovarian pathology, uti, std, appendicitis. hcg negative, UA negative for UTI.   Clinical Course as of Dec 27 2228  Tue Dec 27, 2017  2052 I went in the room to tell patient that her UA was negative and to get her ready for her pelvic exam and patient had left the emergency department.  Before she left I had a discussion with her about the possibilities of her pain which included ovarian pathology, STD, tubo-ovarian abscess, and appendicitis and I explained to her the necessity of doing further labs and imaging.  I will attempt to call patient and get her back to the emergency room.   [CV]    Clinical Course User Index [CV] Don Perking [place], MD     As part of my medical  decision making, I reviewed the following data within the electronic MEDICAL RECORD NUMBER Nursing notes reviewed and incorporated, Labs reviewed , Old chart reviewed, Notes from prior ED visits and [place] Controlled Substance Database    Pertinent labs & imaging results that were available during my care of the patient were reviewed by me and considered in my medical decision making (see chart for details).    ____________________________________________   FINAL CLINICAL IMPRESSION(S) / ED DIAGNOSES  Final diagnoses:  RLQ abdominal pain  Amenorrhea  Nausea      NEW MEDICATIONS STARTED DURING THIS VISIT:  ED Discharge Orders    None       Note:  This document was prepared using Dragon voice recognition software and may include unintentional dictation errors.    Don Perking, [place], MD 12/27/17 2231

## 2017-12-28 ENCOUNTER — Other Ambulatory Visit: Payer: Self-pay

## 2018-01-06 ENCOUNTER — Other Ambulatory Visit (INDEPENDENT_AMBULATORY_CARE_PROVIDER_SITE_OTHER): Payer: 59

## 2018-01-06 ENCOUNTER — Ambulatory Visit (INDEPENDENT_AMBULATORY_CARE_PROVIDER_SITE_OTHER): Payer: 59 | Admitting: Obstetrics and Gynecology

## 2018-01-06 ENCOUNTER — Encounter: Payer: Self-pay | Admitting: Obstetrics and Gynecology

## 2018-01-06 VITALS — BP 103/70 | HR 65 | Ht 64.0 in | Wt 309.9 lb

## 2018-01-06 DIAGNOSIS — N912 Amenorrhea, unspecified: Secondary | ICD-10-CM

## 2018-01-06 LAB — POCT URINE PREGNANCY: Preg Test, Ur: NEGATIVE

## 2018-01-06 MED ORDER — MEDROXYPROGESTERONE ACETATE 10 MG PO TABS: 10.0000 mg | ORAL_TABLET | Freq: Every day | ORAL | 2 refills | Status: DC

## 2018-01-06 MED ORDER — VITAMIN D (ERGOCALCIFEROL) 1.25 MG (50000 UNIT) PO CAPS: 50000.0000 [IU] | ORAL_CAPSULE | ORAL | 1 refills | Status: DC

## 2018-01-06 NOTE — Progress Notes (Signed)
  Subjective:     Patient ID: Sara Boyd, female   DOB: [birth year], 27 y.o.   MRN: [id number]  HPI Reports last menses in June. Never started OCPs. Menses had been normal until June and then decided to try for another pregnancy. Negative home UPT, having daily nausea, weight gain and increased vaginal discharge.   Review of Systems  Constitutional: Positive for appetite change, fatigue and unexpected weight change.  HENT: Negative.   Eyes: Negative.   Respiratory: Negative.   Cardiovascular: Negative.   Gastrointestinal: Positive for constipation and nausea.  Endocrine: Negative.   Genitourinary: Positive for menstrual problem.  Musculoskeletal: Negative.   Skin: Negative.   Neurological: Negative.   Hematological: Negative.   Psychiatric/Behavioral: Negative.        Objective:   Physical Exam A&Ox4 Well groomed female in no distress Blood pressure 103/70, pulse 65, height 5\' 4"  (1.626 m), weight (!) 309 lb 14.4 oz (140.6 kg), unknown if currently breastfeeding. Body mass index is 53.19 kg/m.  Thyroid normal on exam Pelvic deferred in leu of pelvic ultrasound performed today.   ultrasound reveals:  Findings:  The uterus measures 9.2 x 4.7 x 4.2 cm. Echo texture is homogeneous without evidence of focal masses.  The Endometrium measures 11.6 mm.  Right Ovary measures 3.5 x 2.7 x 2.6 cm. It is normal in appearance. Left Ovary measures 3.2 x 2.4 x 1.7 cm. It is normal appearance. Bilateral ovaries seen on transabdominal scan only. Survey of the adnexa demonstrates no adnexal masses. There is no free fluid in the cul de sac. Assessment:     Missed menses Morbid obesity    Plan:     Counseled on normal causes of missed menses including: pregnancy, thyroid disorder, obesity, PCOS, hormonal imbalances. Labs obtained and will follow up accordingly. Will plan provera challenge if everything looks normal and sh is not pregnant.   Melody Shambley,CNM

## 2018-01-07 LAB — COMPREHENSIVE METABOLIC PANEL
A/G RATIO: 1.8 (ref 1.2–2.2)
ALT: 30 IU/L (ref 0–32)
AST: 15 IU/L (ref 0–40)
Albumin: 4.2 g/dL (ref 3.5–5.5)
Alkaline Phosphatase: 69 IU/L (ref 39–117)
BUN/Creatinine Ratio: 17 (ref 9–23)
BUN: 15 mg/dL (ref 6–20)
Bilirubin Total: 0.4 mg/dL (ref 0.0–1.2)
CALCIUM: 9.2 mg/dL (ref 8.7–10.2)
CO2: 21 mmol/L (ref 20–29)
CREATININE: 0.87 mg/dL (ref 0.57–1.00)
Chloride: 104 mmol/L (ref 96–106)
GFR calc Af Amer: 106 mL/min/{1.73_m2} (ref >59)
GFR, EST NON AFRICAN AMERICAN: 92 mL/min/{1.73_m2} (ref >59)
Globulin, Total: 2.4 g/dL (ref 1.5–4.5)
Glucose: 107 mg/dL — ABNORMAL HIGH (ref 65–99)
POTASSIUM: 4.3 mmol/L (ref 3.5–5.2)
Sodium: 141 mmol/L (ref 134–144)
TOTAL PROTEIN: 6.6 g/dL (ref 6.0–8.5)

## 2018-01-07 LAB — FSH/LH
FSH: 3.7 m[IU]/mL
LH: 9.4 m[IU]/mL

## 2018-01-07 LAB — BETA HCG QUANT (REF LAB)

## 2018-01-07 LAB — DHEA-SULFATE: DHEA SO4: 340.8 ug/dL (ref 84.8–378.0)

## 2018-01-07 LAB — THYROID PANEL WITH TSH
Free Thyroxine Index: 2 (ref 1.2–4.9)
T3 UPTAKE RATIO: 27 % (ref 24–39)
T4 TOTAL: 7.4 ug/dL (ref 4.5–12.0)
TSH: 1.48 u[IU]/mL (ref 0.450–4.500)

## 2018-01-07 LAB — PROGESTERONE: PROGESTERONE: 0.2 ng/mL

## 2018-01-07 LAB — INSULIN, RANDOM: INSULIN: 61.3 u[IU]/mL — AB (ref 2.6–24.9)

## 2018-01-07 LAB — ESTRADIOL: ESTRADIOL: 77.7 pg/mL

## 2018-01-07 LAB — HEMOGLOBIN A1C
ESTIMATED AVERAGE GLUCOSE: 111 mg/dL
Hgb A1c MFr Bld: 5.5 % (ref 4.8–5.6)

## 2018-01-11 ENCOUNTER — Other Ambulatory Visit: Payer: Self-pay | Admitting: Obstetrics and Gynecology

## 2018-01-11 DIAGNOSIS — E161 Other hypoglycemia: Secondary | ICD-10-CM

## 2018-01-11 DIAGNOSIS — N926 Irregular menstruation, unspecified: Secondary | ICD-10-CM

## 2018-01-25 ENCOUNTER — Encounter: Payer: Self-pay | Admitting: Internal Medicine

## 2018-01-26 ENCOUNTER — Ambulatory Visit: Payer: 59 | Admitting: Family Medicine

## 2018-01-26 DIAGNOSIS — Z0289 Encounter for other administrative examinations: Secondary | ICD-10-CM

## 2018-01-30 ENCOUNTER — Encounter: Payer: Self-pay | Admitting: Emergency Medicine

## 2018-01-30 ENCOUNTER — Emergency Department
Admission: EM | Admit: 2018-01-30 | Discharge: 2018-01-30 | Disposition: A | Discharge status: Home / Self Care | Payer: 59 | Attending: Emergency Medicine | Admitting: Emergency Medicine

## 2018-01-30 ENCOUNTER — Other Ambulatory Visit: Payer: Self-pay

## 2018-01-30 DIAGNOSIS — R05 Cough: Secondary | ICD-10-CM | POA: Insufficient documentation

## 2018-01-30 DIAGNOSIS — Z91040 Latex allergy status: Secondary | ICD-10-CM | POA: Diagnosis not present

## 2018-01-30 DIAGNOSIS — J40 Bronchitis, not specified as acute or chronic: Secondary | ICD-10-CM | POA: Diagnosis not present

## 2018-01-30 DIAGNOSIS — Z87891 Personal history of nicotine dependence: Secondary | ICD-10-CM | POA: Insufficient documentation

## 2018-01-30 DIAGNOSIS — R0989 Other specified symptoms and signs involving the circulatory and respiratory systems: Secondary | ICD-10-CM | POA: Diagnosis present

## 2018-01-30 DIAGNOSIS — Z79899 Other long term (current) drug therapy: Secondary | ICD-10-CM | POA: Diagnosis not present

## 2018-01-30 MED ORDER — AZITHROMYCIN 250 MG PO TABS: ORAL_TABLET | ORAL | 0 refills | Status: AC

## 2018-01-30 MED ORDER — PSEUDOEPH-BROMPHEN-DM 30-2-10 MG/5ML PO SYRP: 5.0000 mL | ORAL_SOLUTION | Freq: Four times a day (QID) | ORAL | 0 refills | Status: DC | PRN

## 2018-01-30 NOTE — ED Provider Notes (Signed)
[facility] Emergency Department Provider Note   ____________________________________________   First MD Initiated Contact with Patient 01/30/18 1020     (approximate)  I have reviewed the triage vital signs and the nursing notes.   HISTORY  Chief Complaint No chief complaint on file.    HPI Sara Boyd is a 27 y.o. female patient complain of 3 days of chest congestion, productive cough, sinus pressure, and runny nose.  Patient denies fever.  Patient denies nausea, vomiting, diarrhea.  Patient states cough increases with laying down.  Patient denies chest pain.  No palliative measures for complaint.   Past Medical History:  Diagnosis Date  . Chicken pox   . Endometritis   . GERD (gastroesophageal reflux disease)   . Headache   . Medical history non-contributory   . Migraine   . Retained products of conception following abortion   . UTI (urinary tract infection)     Patient Active Problem List   Diagnosis Date Noted  . Chronic UTI 08/08/2017  . Chest pain 08/08/2017  . Anxiety 08/08/2017  . Myalgia 08/08/2017  . Gastroesophageal reflux disease 08/08/2017  . Class 3 severe obesity due to excess calories with body mass index (BMI) of 50.0 to 59.9 in adult (HCC) 08/08/2017  . Palpitations 08/08/2017  . Fatigue 08/08/2017  . Vitamin D deficiency 08/07/2017    Past Surgical History:  Procedure Laterality Date  . BLADDER SURGERY    . DILATION AND CURETTAGE OF UTERUS     elective AB  . DILATION AND EVACUATION N/A 10/29/2016   Procedure: DILATATION AND EVACUATION;  Surgeon: Nadara MustardHarris, Robert P, MD;  Location: [facility] ORS;  Service: Gynecology;  Laterality: N/A;  . HERNIA REPAIR    . HERNIA REPAIR     2011  . TONSILLECTOMY     1998    Prior to Admission medications   Medication Sig Start Date End Date Taking? Authorizing Provider  azithromycin (ZITHROMAX Z-PAK) 250 MG tablet Take 2 tablets (500 mg) on  Day 1,  followed by 1 tablet  (250 mg) once daily on Days 2 through 5. 01/30/18 02/04/18  Joni ReiningSmith, Shanira Tine K, PA-C  brompheniramine-pseudoephedrine-DM 30-2-10 MG/5ML syrup Take 5 mLs by mouth 4 (four) times daily as needed. Patient not taking: Reported on 08/05/2017 07/22/17   Joni ReiningSmith, Jaspreet Hollings K, PA-C  brompheniramine-pseudoephedrine-DM 30-2-10 MG/5ML syrup Take 5 mLs by mouth 4 (four) times daily as needed. 01/30/18   Joni ReiningSmith, Zakk Borgen K, PA-C  Cholecalciferol 50000 units capsule Take 1 capsule (50,000 Units total) by mouth once a week. 08/07/17   McLean-Scocuzza, Pasty Spillersracy N, MD  medroxyPROGESTERone (PROVERA) 10 MG tablet Take 1 tablet (10 mg total) by mouth daily. Use for ten days 01/06/18   Shambley, Melody N, CNM  norethindrone (ORTHO MICRONOR) 0.35 MG tablet Take 1 tablet (0.35 mg total) by mouth daily. Patient not taking: Reported on 01/06/2018 06/02/17   Gunnar BullaLawhorn, Jenkins Michelle, CNM  pantoprazole (PROTONIX) 20 MG tablet Take 1 tablet (20 mg total) by mouth daily. In am 30 minutes before food Patient not taking: Reported on 01/06/2018 08/05/17   McLean-Scocuzza, Pasty Spillersracy N, MD  Vitamin D, Ergocalciferol, (DRISDOL) 50000 units CAPS capsule Take 1 capsule (50,000 Units total) by mouth 2 (two) times a week. 01/09/18   Shambley, Melody N, CNM    Allergies Pineapple; Sulfa antibiotics; Latex; Vicodin [hydrocodone-acetaminophen]; Codeine; and Penicillins  Family History  Problem Relation Age of Onset  . Cancer Mother        NHL  . Hyperlipidemia  Mother   . Hypertension Mother   . COPD Father   . Heart disease Father   . Hypertension Father   . Hyperlipidemia Father   . Diabetes Maternal Grandmother   . Heart disease Maternal Grandfather   . Cancer Paternal Grandmother        breast  . Early death Paternal Grandfather   . Heart disease Paternal Grandfather     Social History Social History   Tobacco Use  . Smoking status: Former Games developer  . Smokeless tobacco: Never Used  Substance Use Topics  . Alcohol use: No  . Drug use: No     Review of Systems Constitutional: No fever/chills Eyes: No visual changes. ENT: No sore throat. Cardiovascular: Denies chest pain. Respiratory: Denies shortness of breath.  Productive cough. Gastrointestinal: No abdominal pain.  No nausea, no vomiting.  No diarrhea.  No constipation. Genitourinary: Negative for dysuria. Musculoskeletal: Negative for back pain. Skin: Negative for rash. Neurological: Negative for headaches, focal weakness or numbness. Allergic/Immunilogical: See medication list. ____________________________________________   PHYSICAL EXAM:  VITAL SIGNS: ED Triage Vitals [01/30/18 1007]  Enc Vitals Group     BP 117/62     Pulse Rate 78     Resp 18     Temp 98.4 F (36.9 C)     Temp Source Oral     SpO2 99 %     Weight (!) 308 lb (139.7 kg)     Height 5\' 4"  (1.626 m)     Head Circumference      Peak Flow      Pain Score      Pain Loc      Pain Edu?      Excl. in GC?    Constitutional: Alert and oriented. Well appearing and in no acute distress. Eyes: Conjunctivae are normal. PERRL. EOMI. Head: Atraumatic. Nose: Edematous nasal turbinates clear rhinorrhea. Mouth/Throat: Mucous membranes are moist.  Oropharynx non-erythematous.  Postnasal drainage. Neck: No stridor.   Cardiovascular: Normal rate, regular rhythm. Grossly normal heart sounds.  Good peripheral circulation. Respiratory: Normal respiratory effort.  No retractions. Lungs CTAB. Neurologic:  Normal speech and language. No gross focal neurologic deficits are appreciated. No gait instability. Skin:  Skin is warm, dry and intact. No rash noted. Psychiatric: Mood and affect are normal. Speech and behavior are normal.  ____________________________________________   LABS (all labs ordered are listed, but only abnormal results are displayed)  Labs Reviewed - No data to  display ____________________________________________  EKG   ____________________________________________  RADIOLOGY  ED MD interpretation:    Official radiology report(s): No results found.  ____________________________________________   PROCEDURES  Procedure(s) performed: None  Procedures  Critical Care performed: No  ____________________________________________   INITIAL IMPRESSION / ASSESSMENT AND PLAN / ED COURSE  As part of my medical decision making, I reviewed the following data within the electronic MEDICAL RECORD NUMBER    Productive cough and chest congestion secondary to bronchitis.  Patient given discharge care instruction advised take medication as directed.  Patient advised follow-up PCP if no improvement in 2 to 3 days.     ____________________________________________   FINAL CLINICAL IMPRESSION(S) / ED DIAGNOSES  Final diagnoses:  Bronchitis     ED Discharge Orders         Ordered    azithromycin (ZITHROMAX Z-PAK) 250 MG tablet     01/30/18 1025    brompheniramine-pseudoephedrine-DM 30-2-10 MG/5ML syrup  4 times daily PRN     01/30/18 1025  Note:  This document was prepared using Dragon voice recognition software and may include unintentional dictation errors.    Joni Reining, PA-C 01/30/18 1451    Emily Filbert, MD 01/30/18 [phone]

## 2018-01-30 NOTE — ED Triage Notes (Signed)
States she is having prod cough and some discomfort in chest  Denies any fever

## 2018-02-16 ENCOUNTER — Encounter: Payer: Self-pay | Admitting: Emergency Medicine

## 2018-02-16 ENCOUNTER — Emergency Department
Admission: EM | Admit: 2018-02-16 | Discharge: 2018-02-16 | Disposition: A | Discharge status: Home / Self Care | Payer: 59 | Attending: Emergency Medicine | Admitting: Emergency Medicine

## 2018-02-16 ENCOUNTER — Other Ambulatory Visit: Payer: Self-pay

## 2018-02-16 DIAGNOSIS — Z79899 Other long term (current) drug therapy: Secondary | ICD-10-CM | POA: Diagnosis not present

## 2018-02-16 DIAGNOSIS — J029 Acute pharyngitis, unspecified: Secondary | ICD-10-CM | POA: Insufficient documentation

## 2018-02-16 DIAGNOSIS — Z87891 Personal history of nicotine dependence: Secondary | ICD-10-CM | POA: Diagnosis not present

## 2018-02-16 DIAGNOSIS — R509 Fever, unspecified: Secondary | ICD-10-CM

## 2018-02-16 LAB — GROUP A STREP BY PCR: GROUP A STREP BY PCR: NOT DETECTED

## 2018-02-16 MED ORDER — AZITHROMYCIN 500 MG PO TABS
500.0000 mg | ORAL_TABLET | Freq: Once | ORAL | Status: AC
  Administered 2018-02-16: 500 mg via ORAL
  Filled 2018-02-16: qty 1

## 2018-02-16 MED ORDER — AZITHROMYCIN 250 MG PO TABS: ORAL_TABLET | ORAL | 0 refills | Status: DC

## 2018-02-16 NOTE — Discharge Instructions (Addendum)
Please alternate Tylenol and ibuprofen as needed for pain and fevers.  Make sure you are drinking lots of fluids.  Take antibiotic as prescribed.  If any difficulty swallowing fevers above 101, worsening symptoms or to change in health return to the emergency department.

## 2018-02-16 NOTE — ED Notes (Signed)
Pt ambulatory to POV without difficulty. VSS. NAD. Discharge instructions, RX and follow up reviewed. All questions and concerns addressed.  

## 2018-02-16 NOTE — ED Provider Notes (Signed)
[facility] EMERGENCY DEPARTMENT Provider Note   CSN: [id number] Arrival date & time: 02/16/18  2032     History   Chief Complaint Chief Complaint  Patient presents with  . Fever  . Sore Throat    HPI Sara Boyd is a 27 y.o. female presents to the emergency department for evaluation of sore throat and fever.  Patient's son with positive sore throat and fever as well.  She denies any difficulty swallowing, headaches, chest pain or shortness of breath.  No nausea or vomiting.  She has had a small pruritic rash along the dorsum of her hands.  Fevers have been up to 101.  HPI  Past Medical History:  Diagnosis Date  . Chicken pox   . Endometritis   . GERD (gastroesophageal reflux disease)   . Headache   . Medical history non-contributory   . Migraine   . Retained products of conception following abortion   . UTI (urinary tract infection)     Patient Active Problem List   Diagnosis Date Noted  . Chronic UTI 08/08/2017  . Chest pain 08/08/2017  . Anxiety 08/08/2017  . Myalgia 08/08/2017  . Gastroesophageal reflux disease 08/08/2017  . Class 3 severe obesity due to excess calories with body mass index (BMI) of 50.0 to 59.9 in adult (HCC) 08/08/2017  . Palpitations 08/08/2017  . Fatigue 08/08/2017  . Vitamin D deficiency 08/07/2017    Past Surgical History:  Procedure Laterality Date  . BLADDER SURGERY    . DILATION AND CURETTAGE OF UTERUS     elective AB  . DILATION AND EVACUATION N/A 10/29/2016   Procedure: DILATATION AND EVACUATION;  Surgeon: Nadara Mustard, MD;  Location: [facility] ORS;  Service: Gynecology;  Laterality: N/A;  . HERNIA REPAIR    . HERNIA REPAIR     2011  . TONSILLECTOMY     1998     OB History    Gravida  4   Para  2   Term  1   Preterm  1   AB  0   Living  2     SAB  0   TAB  0   Ectopic  0   Multiple  0   Live Births           Obstetric Comments  Pt water ruptured @ 19 weeks, bed  rest, baby with CP         Home Medications    Prior to Admission medications   Medication Sig Start Date End Date Taking? Authorizing Provider  azithromycin (ZITHROMAX Z-PAK) 250 MG tablet Take 2 tablets (500 mg) on  Day 1,  followed by 1 tablet (250 mg) once daily on Days 2 through 5. 02/16/18   Evon Slack, PA-C  brompheniramine-pseudoephedrine-DM 30-2-10 MG/5ML syrup Take 5 mLs by mouth 4 (four) times daily as needed. Patient not taking: Reported on 08/05/2017 07/22/17   Joni Reining, PA-C  brompheniramine-pseudoephedrine-DM 30-2-10 MG/5ML syrup Take 5 mLs by mouth 4 (four) times daily as needed. 01/30/18   Joni Reining, PA-C  Cholecalciferol 50000 units capsule Take 1 capsule (50,000 Units total) by mouth once a week. 08/07/17   McLean-Scocuzza, Pasty Spillers, MD  medroxyPROGESTERone (PROVERA) 10 MG tablet Take 1 tablet (10 mg total) by mouth daily. Use for ten days 01/06/18   Shambley, Melody N, CNM  norethindrone (ORTHO MICRONOR) 0.35 MG tablet Take 1 tablet (0.35 mg total) by mouth daily. Patient not taking: Reported on 01/06/2018  06/02/17   Lawhorn, Vanessa [place], CNM  pantoprazole (PROTONIX) 20 MG tablet Take 1 tablet (20 mg total) by mouth daily. In am 30 minutes before food Patient not taking: Reported on 01/06/2018 08/05/17   McLean-Scocuzza, Pasty Spillers, MD  Vitamin D, Ergocalciferol, (DRISDOL) 50000 units CAPS capsule Take 1 capsule (50,000 Units total) by mouth 2 (two) times a week. 01/09/18   Purcell Nails, CNM    Family History Family History  Problem Relation Age of Onset  . Cancer Mother        NHL  . Hyperlipidemia Mother   . Hypertension Mother   . COPD Father   . Heart disease Father   . Hypertension Father   . Hyperlipidemia Father   . Diabetes Maternal Grandmother   . Heart disease Maternal Grandfather   . Cancer Paternal Grandmother        breast  . Early death Paternal Grandfather   . Heart disease Paternal Grandfather     Social History Social  History   Tobacco Use  . Smoking status: Former Games developer  . Smokeless tobacco: Never Used  Substance Use Topics  . Alcohol use: No  . Drug use: No     Allergies   Pineapple; Sulfa antibiotics; Latex; Vicodin [hydrocodone-acetaminophen]; Codeine; and Penicillins   Review of Systems Review of Systems  Constitutional: Positive for fever.  HENT: Positive for sore throat. Negative for congestion, ear discharge, rhinorrhea, sinus pressure, sinus pain, trouble swallowing and voice change.   Respiratory: Negative for cough, shortness of breath, wheezing and stridor.   Cardiovascular: Negative for chest pain.  Gastrointestinal: Negative for abdominal pain, diarrhea, nausea and vomiting.  Genitourinary: Negative for dysuria, flank pain and pelvic pain.  Musculoskeletal: Positive for myalgias. Negative for back pain.  Skin: Negative for rash.  Neurological: Negative for dizziness and headaches.     Physical Exam Updated Vital Signs BP (!) 106/58 (BP Location: Left Arm)   Pulse 96   Temp 98.5 F (36.9 C) (Oral)   Resp 20   Ht 5\' 4"  (1.626 m)   Wt (!) 139.7 kg   LMP 02/12/2018 (Exact Date)   SpO2 100%   BMI 52.87 kg/m   Physical Exam  Constitutional: She is oriented to person, place, and time. She appears well-developed and well-nourished. No distress.  HENT:  Head: Normocephalic and atraumatic.  Right Ear: Hearing, tympanic membrane, external ear and ear canal normal.  Left Ear: Hearing, tympanic membrane, external ear and ear canal normal.  Nose: Rhinorrhea present.  Mouth/Throat: Mucous membranes are normal. No trismus in the jaw. No uvula swelling. Posterior oropharyngeal erythema present. No oropharyngeal exudate, posterior oropharyngeal edema or tonsillar abscesses. No tonsillar exudate.  Positive pharyngeal erythema without exudates.  No tonsillar abscess.  Uvula is midline.  No salivary gland swelling or tenderness.  Positive anterior cervical lymphadenopathy.  Eyes:  Conjunctivae are normal. Right eye exhibits no discharge. Left eye exhibits no discharge.  Neck: Normal range of motion.  Cardiovascular: Normal rate and regular rhythm.  Pulmonary/Chest: Effort normal and breath sounds normal. No stridor. No respiratory distress. She has no wheezes. She has no rales.  Abdominal: Soft. She exhibits no distension. There is no tenderness.  Musculoskeletal: Normal range of motion. She exhibits no deformity.  Lymphadenopathy:    She has cervical adenopathy.  Neurological: She is alert and oriented to person, place, and time. She has normal reflexes.  Skin: Skin is warm and dry.  Psychiatric: She has a normal mood and affect. Her behavior  is normal. Thought content normal.     ED Treatments / Results  Labs (all labs ordered are listed, but only abnormal results are displayed) Labs Reviewed  GROUP A STREP BY PCR    EKG None  Radiology No results found.  Procedures Procedures (including critical care time)  Medications Ordered in ED Medications  azithromycin (ZITHROMAX) tablet 500 mg (has no administration in time range)     Initial Impression / Assessment and Plan / ED Course  I have reviewed the triage vital signs and the nursing notes.  Pertinent labs & imaging results that were available during my care of the patient were reviewed by me and considered in my medical decision making (see chart for details).     27 year old female with fever, sore throat without cough.  Symptoms consistent with strep pharyngitis.  Patient with penicillin allergy, placed on azithromycin.  She is educated on signs and symptoms return to the ED for.  She will increase fluids.  Final Clinical Impressions(s) / ED Diagnoses   Final diagnoses:  Sore throat  Fever, unspecified fever cause    ED Discharge Orders         Ordered    azithromycin (ZITHROMAX Z-PAK) 250 MG tablet  Status:  Discontinued     02/16/18 2317    azithromycin (ZITHROMAX Z-PAK) 250 MG  tablet     02/16/18 2318           Evon Slack, PA-C 02/16/18 2321    Nita Sickle, MD 02/21/18 [phone]

## 2018-02-16 NOTE — ED Triage Notes (Addendum)
Patient to ER for c/o sore throat since yesterday with fever (highest 101), now also having bilateral ear pain. Patient in no acute distress.

## 2018-02-18 ENCOUNTER — Encounter: Payer: Self-pay | Admitting: *Deleted

## 2018-02-18 ENCOUNTER — Emergency Department
Admission: EM | Admit: 2018-02-18 | Discharge: 2018-02-18 | Disposition: A | Discharge status: Home / Self Care | Payer: 59 | Attending: Emergency Medicine | Admitting: Emergency Medicine

## 2018-02-18 ENCOUNTER — Other Ambulatory Visit: Payer: Self-pay

## 2018-02-18 DIAGNOSIS — Z91040 Latex allergy status: Secondary | ICD-10-CM | POA: Diagnosis not present

## 2018-02-18 DIAGNOSIS — Z79899 Other long term (current) drug therapy: Secondary | ICD-10-CM | POA: Diagnosis not present

## 2018-02-18 DIAGNOSIS — B084 Enteroviral vesicular stomatitis with exanthem: Secondary | ICD-10-CM | POA: Diagnosis not present

## 2018-02-18 DIAGNOSIS — Z87891 Personal history of nicotine dependence: Secondary | ICD-10-CM | POA: Diagnosis not present

## 2018-02-18 DIAGNOSIS — R21 Rash and other nonspecific skin eruption: Secondary | ICD-10-CM | POA: Diagnosis present

## 2018-02-18 LAB — GROUP A STREP BY PCR: GROUP A STREP BY PCR: NOT DETECTED

## 2018-02-18 NOTE — ED Triage Notes (Signed)
Seen er 2 days   Ago  For poss  Throat  infection  Had slight  Rash on r  Wrist  wthat has  Gotten worse  And on different parts of body . The rash itches and burns - the  Has  Slight rash No facial swellings   Fingers are slightly. The rash itches.

## 2018-02-18 NOTE — ED Provider Notes (Signed)
[facility] Emergency Department Provider Note  ____________________________________________   First MD Initiated Contact with Patient 02/18/18 1531     (approximate)  I have reviewed the triage vital signs and the nursing notes.   HISTORY  Chief Complaint No chief complaint on file.    HPI Sara Boyd is a 27 y.o. female presents emergency department complaining of a rash on her hands, feet, and ulcers in her throat.  She states she was seen here and was told she had strep throat.  She did not get the Z-Pak filled as she did not feel like it was strep throat.  She states her son had the same symptoms but has started improving.  She is concerned as the rash seems to be spreading and is more itchy.  She denies fever chills at this time.  She denies any cough or congestion.    Past Medical History:  Diagnosis Date  . Chicken pox   . Endometritis   . GERD (gastroesophageal reflux disease)   . Headache   . Medical history non-contributory   . Migraine   . Retained products of conception following abortion   . UTI (urinary tract infection)     Patient Active Problem List   Diagnosis Date Noted  . Chronic UTI 08/08/2017  . Chest pain 08/08/2017  . Anxiety 08/08/2017  . Myalgia 08/08/2017  . Gastroesophageal reflux disease 08/08/2017  . Class 3 severe obesity due to excess calories with body mass index (BMI) of 50.0 to 59.9 in adult (HCC) 08/08/2017  . Palpitations 08/08/2017  . Fatigue 08/08/2017  . Vitamin D deficiency 08/07/2017    Past Surgical History:  Procedure Laterality Date  . BLADDER SURGERY    . DILATION AND CURETTAGE OF UTERUS     elective AB  . DILATION AND EVACUATION N/A 10/29/2016   Procedure: DILATATION AND EVACUATION;  Surgeon: Nadara Mustard, MD;  Location: [facility] ORS;  Service: Gynecology;  Laterality: N/A;  . HERNIA REPAIR    . HERNIA REPAIR     2011  . TONSILLECTOMY     1998    Prior to Admission  medications   Medication Sig Start Date End Date Taking? Authorizing Provider  Cholecalciferol 50000 units capsule Take 1 capsule (50,000 Units total) by mouth once a week. 08/07/17   McLean-Scocuzza, Pasty Spillers, MD  medroxyPROGESTERone (PROVERA) 10 MG tablet Take 1 tablet (10 mg total) by mouth daily. Use for ten days 01/06/18   Shambley, Melody N, CNM  norethindrone (ORTHO MICRONOR) 0.35 MG tablet Take 1 tablet (0.35 mg total) by mouth daily. Patient not taking: Reported on 01/06/2018 06/02/17   Gunnar Bulla, CNM  Vitamin D, Ergocalciferol, (DRISDOL) 50000 units CAPS capsule Take 1 capsule (50,000 Units total) by mouth 2 (two) times a week. 01/09/18   Shambley, Melody N, CNM    Allergies Pineapple; Sulfa antibiotics; Latex; Vicodin [hydrocodone-acetaminophen]; Codeine; and Penicillins  Family History  Problem Relation Age of Onset  . Cancer Mother        NHL  . Hyperlipidemia Mother   . Hypertension Mother   . COPD Father   . Heart disease Father   . Hypertension Father   . Hyperlipidemia Father   . Diabetes Maternal Grandmother   . Heart disease Maternal Grandfather   . Cancer Paternal Grandmother        breast  . Early death Paternal Grandfather   . Heart disease Paternal Grandfather     Social History Social History  Tobacco Use  . Smoking status: Former Games developer  . Smokeless tobacco: Never Used  Substance Use Topics  . Alcohol use: No  . Drug use: No    Review of Systems  Constitutional: No fever/chills Eyes: No visual changes. ENT: Positive for ulcers in the back of the throat and sore throat. Respiratory: Denies cough Genitourinary: Negative for dysuria. Musculoskeletal: Negative for back pain. Skin: Positive rash on the hands and feet    ____________________________________________   PHYSICAL EXAM:  VITAL SIGNS: ED Triage Vitals  Enc Vitals Group     BP 02/18/18 1526 130/80     Pulse Rate 02/18/18 1526 73     Resp 02/18/18 1526 18     Temp  02/18/18 1526 98.4 F (36.9 C)     Temp Source 02/18/18 1526 Oral     SpO2 02/18/18 1526 99 %     Weight 02/18/18 1526 (!) 305 lb (138.3 kg)     Height 02/18/18 1526 5\' 4"  (1.626 m)     Head Circumference --      Peak Flow --      Pain Score 02/18/18 1539 10     Pain Loc --      Pain Edu? --      Excl. in GC? --     Constitutional: Alert and oriented. Well appearing and in no acute distress. Eyes: Conjunctivae are normal.  Head: Atraumatic. Nose: No congestion/rhinnorhea. Mouth/Throat: Mucous membranes are moist.  Positive for several ulcers noted in the posterior pharynx Neck:  supple no lymphadenopathy noted Cardiovascular: Normal rate, regular rhythm. Heart sounds are normal Respiratory: Normal respiratory effort.  No retractions, lungs c t a  GU: deferred Musculoskeletal: FROM all extremities, warm and well perfused Neurologic:  Normal speech and language.  Skin:  Skin is warm, dry and intact.  Several reddened areas noted on the palms of the hands soles of the feet and ankles. Psychiatric: Mood and affect are normal. Speech and behavior are normal.  ____________________________________________   LABS (all labs ordered are listed, but only abnormal results are displayed)  Labs Reviewed  GROUP A STREP BY PCR  RPR   ____________________________________________   ____________________________________________  RADIOLOGY    ____________________________________________   PROCEDURES  Procedure(s) performed: No  Procedures    ____________________________________________   INITIAL IMPRESSION / ASSESSMENT AND PLAN / ED COURSE  Pertinent labs & imaging results that were available during my care of the patient were reviewed by me and considered in my medical decision making (see chart for details).   Patient is 27 year old female presents emergency department complaining of a rash on the hands feet and ulcers in the posterior throat.  On physical exam patient  appears well.  She does have few ulcers in the posterior pharynx, rash on the soles of the feet and the ankles, along with rash on the palms and fingers of both hands.  No drainage is noted from any other sites.  Discussed the findings with the patient.  Explained to her that she has hand-foot-and-mouth which is a viral infection.  Due to it being on the palms of the hands and soles of the feet she was tested for syphilis.  We also repeated strep test to be complete as she did not get the Z-Pak filled but she was previously prescribed.  She was discharged in stable condition.  She is to return if worsening.  She was given a work note as requested.     As part of my medical decision  making, I reviewed the following data within the electronic MEDICAL RECORD NUMBER Nursing notes reviewed and incorporated, Labs reviewed strep test negative, Old chart reviewed, Notes from prior ED visits and [place] Controlled Substance Database  ____________________________________________   FINAL CLINICAL IMPRESSION(S) / ED DIAGNOSES  Final diagnoses:  Hand, foot and mouth disease      NEW MEDICATIONS STARTED DURING THIS VISIT:  Current Discharge Medication List       Note:  This document was prepared using Dragon voice recognition software and may include unintentional dictation errors.    Faythe Ghee, PA-C 02/18/18 1837    Pershing Proud Myra Rude, MD 02/18/18 2103

## 2018-02-18 NOTE — Discharge Instructions (Addendum)
Follow-up with your regular doctor as needed.  Return emergency department if worsening. 

## 2018-02-20 ENCOUNTER — Encounter: Payer: Self-pay | Admitting: Internal Medicine

## 2018-02-20 LAB — RPR: RPR: NONREACTIVE

## 2018-04-24 ENCOUNTER — Encounter: Payer: 59 | Admitting: Certified Nurse Midwife

## 2018-04-27 ENCOUNTER — Encounter: Payer: 59 | Admitting: Certified Nurse Midwife

## 2018-05-04 ENCOUNTER — Telehealth: Payer: Self-pay

## 2018-05-04 ENCOUNTER — Encounter: Payer: Self-pay | Admitting: Internal Medicine

## 2018-05-04 ENCOUNTER — Ambulatory Visit (INDEPENDENT_AMBULATORY_CARE_PROVIDER_SITE_OTHER): Payer: 59 | Admitting: Internal Medicine

## 2018-05-04 VITALS — BP 110/66 | HR 80 | Temp 98.4°F | Ht 64.0 in | Wt 308.6 lb

## 2018-05-04 DIAGNOSIS — F429 Obsessive-compulsive disorder, unspecified: Secondary | ICD-10-CM

## 2018-05-04 DIAGNOSIS — F32A Depression, unspecified: Secondary | ICD-10-CM

## 2018-05-04 DIAGNOSIS — F419 Anxiety disorder, unspecified: Secondary | ICD-10-CM | POA: Diagnosis not present

## 2018-05-04 DIAGNOSIS — F4321 Adjustment disorder with depressed mood: Secondary | ICD-10-CM | POA: Diagnosis not present

## 2018-05-04 DIAGNOSIS — F410 Panic disorder [episodic paroxysmal anxiety] without agoraphobia: Secondary | ICD-10-CM | POA: Diagnosis not present

## 2018-05-04 DIAGNOSIS — F329 Major depressive disorder, single episode, unspecified: Secondary | ICD-10-CM

## 2018-05-04 MED ORDER — BUPROPION HCL ER (XL) 150 MG PO TB24: 150.0000 mg | ORAL_TABLET | Freq: Every day | ORAL | 0 refills | Status: DC

## 2018-05-04 MED ORDER — SERTRALINE HCL 50 MG PO TABS: 50.0000 mg | ORAL_TABLET | Freq: Every day | ORAL | 0 refills | Status: DC

## 2018-05-04 NOTE — Telephone Encounter (Signed)
Spoken with patient she will be going to [facility] due to being unable to have an earlier appointment.  Also informed her that [facility] has walkins as well.  She refused to go to ED. She stated she is in a very crazy rut due to father passing away last Thursday.  Copied from CRM #[id number]. Topic: Appointment Scheduling - Scheduling Inquiry for Clinic >> May 04, 2018  8:45 AM Lynne LoganHudson, Caryn D wrote: Reason for CRM: Pt called to schedule appt with Dr. Shirlee LatchMcLean. She stated she has been having severe anxiety since her father passed away recently. She would like to be seen before her appt on 05/18/18. Stated she has been having anxiety attacks. Please reach out to patient if she can be worked in sooner. CB#[phone]

## 2018-05-04 NOTE — Patient Instructions (Signed)
[employer] theanine chewable supplement for anxiety and stress over the counter  Sertraline tablets What is this medicine? SERTRALINE (SER tra leen) is used to treat depression. It may also be used to treat obsessive compulsive disorder, panic disorder, post-trauma stress, premenstrual dysphoric disorder (PMDD) or social anxiety. This medicine may be used for other purposes; ask your health care provider or pharmacist if you have questions. COMMON BRAND NAME(S): Zoloft What should I tell my health care provider before I take this medicine? They need to know if you have any of these conditions: -bleeding disorders -bipolar disorder or a family history of bipolar disorder -glaucoma -heart disease -high blood pressure -history of irregular heartbeat -history of low levels of calcium, magnesium, or potassium in the blood -if you often drink alcohol -liver disease -receiving electroconvulsive therapy -seizures -suicidal thoughts, plans, or attempt; a previous suicide attempt by you or a family member -take medicines that treat or prevent blood clots -thyroid disease -an unusual or allergic reaction to sertraline, other medicines, foods, dyes, or preservatives -pregnant or trying to get pregnant -breast-feeding How should I use this medicine? Take this medicine by mouth with a glass of water. Follow the directions on the prescription label. You can take it with or without food. Take your medicine at regular intervals. Do not take your medicine more often than directed. Do not stop taking this medicine suddenly except upon the advice of your doctor. Stopping this medicine too quickly may cause serious side effects or your condition may worsen. A special MedGuide will be given to you by the pharmacist with each prescription and refill. Be sure to read this information carefully each time. Talk to your pediatrician regarding the use of this medicine in children. While this drug may be  prescribed for children as young as 7 years for selected conditions, precautions do apply. Overdosage: If you think you have taken too much of this medicine contact a poison control center or emergency room at once. NOTE: This medicine is only for you. Do not share this medicine with others. What if I miss a dose? If you miss a dose, take it as soon as you can. If it is almost time for your next dose, take only that dose. Do not take double or extra doses. What may interact with this medicine? Do not take this medicine with any of the following medications: -cisapride -dofetilide -dronedarone -linezolid -MAOIs like Carbex, Eldepryl, Marplan, Nardil, and Parnate -methylene blue (injected into a vein) -pimozide -thioridazine This medicine may also interact with the following medications: -alcohol -amphetamines -aspirin and aspirin-like medicines -certain medicines for depression, anxiety, or psychotic disturbances -certain medicines for fungal infections like ketoconazole, fluconazole, posaconazole, and itraconazole -certain medicines for irregular heart beat like flecainide, quinidine, propafenone -certain medicines for migraine headaches like almotriptan, eletriptan, frovatriptan, naratriptan, rizatriptan, sumatriptan, zolmitriptan -certain medicines for sleep -certain medicines for seizures like carbamazepine, valproic acid, phenytoin -certain medicines that treat or prevent blood clots like warfarin, enoxaparin, dalteparin -cimetidine -digoxin -diuretics -fentanyl -isoniazid -lithium -NSAIDs, medicines for pain and inflammation, like ibuprofen or naproxen -other medicines that prolong the QT interval (cause an abnormal heart rhythm) -rasagiline -safinamide -supplements like St. John's wort, kava kava, valerian -tolbutamide -tramadol -tryptophan This list may not describe all possible interactions. Give your health care provider a list of all the medicines, herbs,  non-prescription drugs, or dietary supplements you use. Also tell them if you smoke, drink alcohol, or use illegal drugs. Some items may interact with your medicine. What  should I watch for while using this medicine? Tell your doctor if your symptoms do not get better or if they get worse. Visit your doctor or health care professional for regular checks on your progress. Because it may take several weeks to see the full effects of this medicine, it is important to continue your treatment as prescribed by your doctor. Patients and their families should watch out for new or worsening thoughts of suicide or depression. Also watch out for sudden changes in feelings such as feeling anxious, agitated, panicky, irritable, hostile, aggressive, impulsive, severely restless, overly excited and hyperactive, or not being able to sleep. If this happens, especially at the beginning of treatment or after a change in dose, call your health care professional. Bonita Quin may get drowsy or dizzy. Do not drive, use machinery, or do anything that needs mental alertness until you know how this medicine affects you. Do not stand or sit up quickly, especially if you are an older patient. This reduces the risk of dizzy or fainting spells. Alcohol may interfere with the effect of this medicine. Avoid alcoholic drinks. Your mouth may get dry. Chewing sugarless gum or sucking hard candy, and drinking plenty of water may help. Contact your doctor if the problem does not go away or is severe. What side effects may I notice from receiving this medicine? Side effects that you should report to your doctor or health care professional as soon as possible: -allergic reactions like skin rash, itching or hives, swelling of the face, lips, or tongue -anxious -black, tarry stools -changes in vision -confusion -elevated mood, decreased need for sleep, racing thoughts, impulsive behavior -eye pain -fast, irregular heartbeat -feeling faint or  lightheaded, falls -feeling agitated, angry, or irritable -hallucination, loss of contact with reality -loss of balance or coordination -loss of memory -painful or prolonged erections -restlessness, pacing, inability to keep still -seizures -stiff muscles -suicidal thoughts or other mood changes -trouble sleeping -unusual bleeding or bruising -unusually weak or tired -vomiting Side effects that usually do not require medical attention (report to your doctor or health care professional if they continue or are bothersome): -change in appetite or weight -change in sex drive or performance -diarrhea -increased sweating -indigestion, nausea -tremors This list may not describe all possible side effects. Call your doctor for medical advice about side effects. You may report side effects to FDA at 1-800-FDA-1088. Where should I keep my medicine? Keep out of the reach of children. Store at room temperature between 15 and 30 degrees C (59 and 86 degrees F). Throw away any unused medicine after the expiration date. NOTE: This sheet is a summary. It may not cover all possible information. If you have questions about this medicine, talk to your doctor, pharmacist, or health care provider.  2019 Elsevier/Gold Standard (2016-04-30 14:17:49)   Bupropion extended-release tablets (Depression/Mood Disorders) What is this medicine? BUPROPION (byoo PROE pee on) is used to treat depression. This medicine may be used for other purposes; ask your health care provider or pharmacist if you have questions. COMMON BRAND NAME(S): Aplenzin, Budeprion XL, Forfivo XL, Wellbutrin XL What should I tell my health care provider before I take this medicine? They need to know if you have any of these conditions: -an eating disorder, such as anorexia or bulimia -bipolar disorder or psychosis -diabetes or high blood sugar, treated with medication -glaucoma -head injury or brain tumor -heart disease, previous heart  attack, or irregular heart beat -high blood pressure -kidney or liver disease -seizures (convulsions) -suicidal  thoughts or a previous suicide attempt -Tourette's syndrome -weight loss -an unusual or allergic reaction to bupropion, other medicines, foods, dyes, or preservatives -breast-feeding -pregnant or trying to become pregnant How should I use this medicine? Take this medicine by mouth with a glass of water. Follow the directions on the prescription label. You can take it with or without food. If it upsets your stomach, take it with food. Do not crush, chew, or cut these tablets. This medicine is taken once daily at the same time each day. Do not take your medicine more often than directed. Do not stop taking this medicine suddenly except upon the advice of your doctor. Stopping this medicine too quickly may cause serious side effects or your condition may worsen. A special MedGuide will be given to you by the pharmacist with each prescription and refill. Be sure to read this information carefully each time. Talk to your pediatrician regarding the use of this medicine in children. Special care may be needed. Overdosage: If you think you have taken too much of this medicine contact a poison control center or emergency room at once. NOTE: This medicine is only for you. Do not share this medicine with others. What if I miss a dose? If you miss a dose, skip the missed dose and take your next tablet at the regular time. Do not take double or extra doses. What may interact with this medicine? Do not take this medicine with any of the following medications: -linezolid -MAOIs like Azilect, Carbex, Eldepryl, Marplan, Nardil, and Parnate -methylene blue (injected into a vein) -other medicines that contain bupropion like Zyban This medicine may also interact with the following medications: -alcohol -certain medicines for anxiety or sleep -certain medicines for blood pressure like metoprolol,  propranolol -certain medicines for depression or psychotic disturbances -certain medicines for HIV or AIDS like efavirenz, lopinavir, nelfinavir, ritonavir -certain medicines for irregular heart beat like propafenone, flecainide -certain medicines for Parkinson's disease like amantadine, levodopa -certain medicines for seizures like carbamazepine, phenytoin, phenobarbital -cimetidine -clopidogrel -cyclophosphamide -digoxin -furazolidone -isoniazid -nicotine -orphenadrine -procarbazine -steroid medicines like prednisone or cortisone -stimulant medicines for attention disorders, weight loss, or to stay awake -tamoxifen -theophylline -thiotepa -ticlopidine -tramadol -warfarin This list may not describe all possible interactions. Give your health care provider a list of all the medicines, herbs, non-prescription drugs, or dietary supplements you use. Also tell them if you smoke, drink alcohol, or use illegal drugs. Some items may interact with your medicine. What should I watch for while using this medicine? Tell your doctor if your symptoms do not get better or if they get worse. Visit your doctor or health care professional for regular checks on your progress. Because it may take several weeks to see the full effects of this medicine, it is important to continue your treatment as prescribed by your doctor. Patients and their families should watch out for new or worsening thoughts of suicide or depression. Also watch out for sudden changes in feelings such as feeling anxious, agitated, panicky, irritable, hostile, aggressive, impulsive, severely restless, overly excited and hyperactive, or not being able to sleep. If this happens, especially at the beginning of treatment or after a change in dose, call your health care professional. Avoid alcoholic drinks while taking this medicine. Drinking large amounts of alcoholic beverages, using sleeping or anxiety medicines, or quickly stopping the use  of these agents while taking this medicine may increase your risk for a seizure. Do not drive or use heavy machinery until you know  how this medicine affects you. This medicine can impair your ability to perform these tasks. Do not take this medicine close to bedtime. It may prevent you from sleeping. Your mouth may get dry. Chewing sugarless gum or sucking hard candy, and drinking plenty of water may help. Contact your doctor if the problem does not go away or is severe. The tablet shell for some brands of this medicine does not dissolve. This is normal. The tablet shell may appear whole in the stool. This is not a cause for concern. What side effects may I notice from receiving this medicine? Side effects that you should report to your doctor or health care professional as soon as possible: -allergic reactions like skin rash, itching or hives, swelling of the face, lips, or tongue -breathing problems -changes in vision -confusion -elevated mood, decreased need for sleep, racing thoughts, impulsive behavior -fast or irregular heartbeat -hallucinations, loss of contact with reality -increased blood pressure -redness, blistering, peeling or loosening of the skin, including inside the mouth -seizures -suicidal thoughts or other mood changes -unusually weak or tired -vomiting Side effects that usually do not require medical attention (report to your doctor or health care professional if they continue or are bothersome): -constipation -headache -loss of appetite -nausea -tremors -weight loss This list may not describe all possible side effects. Call your doctor for medical advice about side effects. You may report side effects to FDA at 1-800-FDA-1088. Where should I keep my medicine? Keep out of the reach of children. Store at room temperature between 15 and 30 degrees C (59 and 86 degrees F). Throw away any unused medicine after the expiration date. NOTE: This sheet is a summary. It may  not cover all possible information. If you have questions about this medicine, talk to your doctor, pharmacist, or health care provider.  2019 Elsevier/Gold Standard (2015-10-17 13:55:13)  Living With Depression Everyone experiences occasional disappointment, sadness, and loss in their lives. When you are feeling down, blue, or sad for at least 2 weeks in a row, it may mean that you have depression. Depression can affect your thoughts and feelings, relationships, daily activities, and physical health. It is caused by changes in the way your brain functions. If you receive a diagnosis of depression, your health care provider will tell you which type of depression you have and what treatment options are available to you. If you are living with depression, there are ways to help you recover from it and also ways to prevent it from coming back. How to cope with lifestyle changes Coping with stress     Stress is your body's reaction to life changes and events, both good and bad. Stressful situations may include:  Getting married.  The death of a spouse.  Losing a job.  Retiring.  Having a baby. Stress can last just a few hours or it can be ongoing. Stress can play a major role in depression, so it is important to learn both how to cope with stress and how to think about it differently. Talk with your health care provider or a counselor if you would like to learn more about stress reduction. He or she may suggest some stress reduction techniques, such as:  Music therapy. This can include creating music or listening to music. Choose music that you enjoy and that inspires you.  Mindfulness-based meditation. This kind of meditation can be done while sitting or walking. It involves being aware of your normal breaths, rather than trying to control your  breathing.  Centering prayer. This is a kind of meditation that involves focusing on a spiritual word or phrase. Choose a word, phrase, or sacred  image that is meaningful to you and that brings you peace.  Deep breathing. To do this, expand your stomach and inhale slowly through your nose. Hold your breath for 3-5 seconds, then exhale slowly, allowing your stomach muscles to relax.  Muscle relaxation. This involves intentionally tensing muscles then relaxing them. Choose a stress reduction technique that fits your lifestyle and personality. Stress reduction techniques take time and practice to develop. Set aside 5-15 minutes a day to do them. Therapists can offer training in these techniques. The training may be covered by some insurance plans. Other things you can do to manage stress include:  Keeping a stress diary. This can help you learn what triggers your stress and ways to control your response.  Understanding what your limits are and saying no to requests or events that lead to a schedule that is too full.  Thinking about how you respond to certain situations. You may not be able to control everything, but you can control how you react.  Adding humor to your life by watching funny films or TV shows.  Making time for activities that help you relax and not feeling guilty about spending your time this way.  Medicines Your health care provider may suggest certain medicines if he or she feels that they will help improve your condition. Avoid using alcohol and other substances that may prevent your medicines from working properly (may interact). It is also important to:  Talk with your pharmacist or health care provider about all the medicines that you take, their possible side effects, and what medicines are safe to take together.  Make it your goal to take part in all treatment decisions (shared decision-making). This includes giving input on the side effects of medicines. It is best if shared decision-making with your health care provider is part of your total treatment plan. If your health care provider prescribes a medicine, you  may not notice the full benefits of it for 4-8 weeks. Most people who are treated for depression need to be on medicine for at least 6-12 months after they feel better. If you are taking medicines as part of your treatment, do not stop taking medicines without first talking to your health care provider. You may need to have the medicine slowly decreased (tapered) over time to decrease the risk of harmful side effects. Relationships Your health care provider may suggest family therapy along with individual therapy and drug therapy. While there may not be family problems that are causing you to feel depressed, it is still important to make sure your family learns as much as they can about your mental health. Having your family's support can help make your treatment successful. How to recognize changes in your condition Everyone has a different response to treatment for depression. Recovery from major depression happens when you have not had signs of major depression for two months. This may mean that you will start to:  Have more interest in doing activities.  Feel less hopeless than you did 2 months ago.  Have more energy.  Overeat less often, or have better or improving appetite.  Have better concentration. Your health care provider will work with you to decide the next steps in your recovery. It is also important to recognize when your condition is getting worse. Watch for these signs:  Having fatigue or low  energy.  Eating too much or too little.  Sleeping too much or too little.  Feeling restless, agitated, or hopeless.  Having trouble concentrating or making decisions.  Having unexplained physical complaints.  Feeling irritable, angry, or aggressive. Get help as soon as you or your family members notice these symptoms coming back. How to get support and help from others How to talk with friends and family members about your condition  Talking to friends and family members about  your condition can provide you with one way to get support and guidance. Reach out to trusted friends or family members, explain your symptoms to them, and let them know that you are working with a health care provider to treat your depression. Financial resources Not all insurance plans cover mental health care, so it is important to check with your insurance carrier. If paying for co-pays or counseling services is a problem, search for a local or county mental health care center. They may be able to offer public mental health care services at low or no cost when you are not able to see a private health care provider. If you are taking medicine for depression, you may be able to get the generic form, which may be less expensive. Some makers of prescription medicines also offer help to patients who cannot afford the medicines they need. Follow these instructions at home:   Get the right amount and quality of sleep.  Cut down on using caffeine, tobacco, alcohol, and other potentially harmful substances.  Try to exercise, such as walking or lifting small weights.  Take over-the-counter and prescription medicines only as told by your health care provider.  Eat a healthy diet that includes plenty of vegetables, fruits, whole grains, low-fat dairy products, and lean protein. Do not eat a lot of foods that are high in solid fats, added sugars, or salt.  Keep all follow-up visits as told by your health care provider. This is important. Contact a health care provider if:  You stop taking your antidepressant medicines, and you have any of these symptoms: ? Nausea. ? Headache. ? Feeling lightheaded. ? Chills and body aches. ? Not being able to sleep (insomnia).  You or your friends and family think your depression is getting worse. Get help right away if:  You have thoughts of hurting yourself or others. If you ever feel like you may hurt yourself or others, or have thoughts about taking your  own life, get help right away. You can go to your nearest emergency department or call:  Your local emergency services (911 in the [place]  A suicide crisis helpline, such as the National Suicide Prevention Lifeline at 419 629 8717. This is open 24-hours a day. Summary  If you are living with depression, there are ways to help you recover from it and also ways to prevent it from coming back.  Work with your health care team to create a management plan that includes counseling, stress management techniques, and healthy lifestyle habits. This information is not intended to replace advice given to you by your health care provider. Make sure you discuss any questions you have with your health care provider. Document Released: 03/29/2016 Document Revised: 03/29/2016 Document Reviewed: 03/29/2016 Elsevier Interactive Patient Education  2019 [employer]  Generalized Anxiety Disorder, Adult Generalized anxiety disorder (GAD) is a mental health disorder. People with this condition constantly worry about everyday events. Unlike normal anxiety, worry related to GAD is not triggered by a specific event. These worries also do not  fade or get better with time. GAD interferes with life functions, including relationships, work, and school. GAD can vary from mild to severe. People with severe GAD can have intense waves of anxiety with physical symptoms (panic attacks). What are the causes? The exact cause of GAD is not known. What increases the risk? This condition is more likely to develop in:  Women.  People who have a family history of anxiety disorders.  People who are very shy.  People who experience very stressful life events, such as the death of a loved one.  People who have a very stressful family environment. What are the signs or symptoms? People with GAD often worry excessively about many things in their lives, such as their health and family. They may also be overly concerned  about:  Doing well at work.  Being on time.  Natural disasters.  Friendships. Physical symptoms of GAD include:  Fatigue.  Muscle tension or having muscle twitches.  Trembling or feeling shaky.  Being easily startled.  Feeling like your heart is pounding or racing.  Feeling out of breath or like you cannot take a deep breath.  Having trouble falling asleep or staying asleep.  Sweating.  Nausea, diarrhea, or irritable bowel syndrome (IBS).  Headaches.  Trouble concentrating or remembering facts.  Restlessness.  Irritability. How is this diagnosed? Your health care provider can diagnose GAD based on your symptoms and medical history. You will also have a physical exam. The health care provider will ask specific questions about your symptoms, including how severe they are, when they started, and if they come and go. Your health care provider may ask you about your use of alcohol or drugs, including prescription medicines. Your health care provider may refer you to a mental health specialist for further evaluation. Your health care provider will do a thorough examination and may perform additional tests to rule out other possible causes of your symptoms. To be diagnosed with GAD, a person must have anxiety that:  Is out of his or her control.  Affects several different aspects of his or her life, such as work and relationships.  Causes distress that makes him or her unable to take part in normal activities.  Includes at least three physical symptoms of GAD, such as restlessness, fatigue, trouble concentrating, irritability, muscle tension, or sleep problems. Before your health care provider can confirm a diagnosis of GAD, these symptoms must be present more days than they are not, and they must last for six months or longer. How is this treated? The following therapies are usually used to treat GAD:  Medicine. Antidepressant medicine is usually prescribed for long-term  daily control. Antianxiety medicines may be added in severe cases, especially when panic attacks occur.  Talk therapy (psychotherapy). Certain types of talk therapy can be helpful in treating GAD by providing support, education, and guidance. Options include: ? Cognitive behavioral therapy (CBT). People learn coping skills and techniques to ease their anxiety. They learn to identify unrealistic or negative thoughts and behaviors and to replace them with positive ones. ? Acceptance and commitment therapy (ACT). This treatment teaches people how to be mindful as a way to cope with unwanted thoughts and feelings. ? Biofeedback. This process trains you to manage your body's response (physiological response) through breathing techniques and relaxation methods. You will work with a therapist while machines are used to monitor your physical symptoms.  Stress management techniques. These include yoga, meditation, and exercise. A mental health specialist can help determine  which treatment is best for you. Some people see improvement with one type of therapy. However, other people require a combination of therapies. Follow these instructions at home:  Take over-the-counter and prescription medicines only as told by your health care provider.  Try to maintain a normal routine.  Try to anticipate stressful situations and allow extra time to manage them.  Practice any stress management or self-calming techniques as taught by your health care provider.  Do not punish yourself for setbacks or for not making progress.  Try to recognize your accomplishments, even if they are small.  Keep all follow-up visits as told by your health care provider. This is important. Contact a health care provider if:  Your symptoms do not get better.  Your symptoms get worse.  You have signs of depression, such as: ? A persistently sad, cranky, or irritable mood. ? Loss of enjoyment in activities that used to bring you  joy. ? Change in weight or eating. ? Changes in sleeping habits. ? Avoiding friends or family members. ? Loss of energy for normal tasks. ? Feelings of guilt or worthlessness. Get help right away if:  You have serious thoughts about hurting yourself or others. If you ever feel like you may hurt yourself or others, or have thoughts about taking your own life, get help right away. You can go to your nearest emergency department or call:  Your local emergency services (911 in the [place]  A suicide crisis helpline, such as the National Suicide Prevention Lifeline at 253-685-8463. This is open 24 hours a day. Summary  Generalized anxiety disorder (GAD) is a mental health disorder that involves worry that is not triggered by a specific event.  People with GAD often worry excessively about many things in their lives, such as their health and family.  GAD may cause physical symptoms such as restlessness, trouble concentrating, sleep problems, frequent sweating, nausea, diarrhea, headaches, and trembling or muscle twitching.  A mental health specialist can help determine which treatment is best for you. Some people see improvement with one type of therapy. However, other people require a combination of therapies. This information is not intended to replace advice given to you by your health care provider. Make sure you discuss any questions you have with your health care provider. Document Released: 08/21/2012 Document Revised: 03/16/2016 Document Reviewed: 03/16/2016 Elsevier Interactive Patient Education  2019 [employer].

## 2018-05-04 NOTE — Progress Notes (Signed)
Chief Complaint  Patient presents with  . Anxiety   F/u  1. Anxiety, ocd/panic and depression PHQ 9 score 23 and GAD 7 score 20 today she has crying and mood swings and thoughts of doom. No SI/HI. She has been on prozac, klonopin, abilify in the past but made her feel in a fog her dad died last 10-06-22 he had dementia and he was in hospice which upset her for the last 2-3 weeks she went daily and saw him decline.  2. Contraception off Depo and ortho micronor due to weight gain and doing withdrawal method   Review of Systems  Constitutional: Negative for weight loss.  HENT: Negative for hearing loss.   Respiratory: Negative for shortness of breath.   Cardiovascular: Negative for chest pain.  Gastrointestinal: Negative for abdominal pain.  Skin: Negative for rash.  Psychiatric/Behavioral: Positive for depression. The patient is nervous/anxious.        +OCD/panic     Past Medical History:  Diagnosis Date  . Anxiety   . Chicken pox   . Depression   . Endometritis   . GERD (gastroesophageal reflux disease)   . Headache   . Medical history non-contributory   . Migraine   . OCD (obsessive compulsive disorder)   . Retained products of conception following abortion   . UTI (urinary tract infection)    Past Surgical History:  Procedure Laterality Date  . BLADDER SURGERY    . DILATION AND CURETTAGE OF UTERUS     elective AB  . DILATION AND EVACUATION N/A 10/29/2016   Procedure: DILATATION AND EVACUATION;  Surgeon: Nadara Mustard, MD;  Location: [facility] ORS;  Service: Gynecology;  Laterality: N/A;  . HERNIA REPAIR    . HERNIA REPAIR     2011  . TONSILLECTOMY     1998   Family History  Problem Relation Age of Onset  . Cancer Mother        NHL  . Hyperlipidemia Mother   . Hypertension Mother   . COPD Father   . Heart disease Father   . Hypertension Father   . Hyperlipidemia Father   . Diabetes Maternal Grandmother   . Heart disease Maternal Grandfather   . Cancer Paternal  Grandmother        breast  . Early death Paternal Grandfather   . Heart disease Paternal Grandfather    Social History   Socioeconomic History  . Marital status: Married    Spouse name: Not on file  . Number of children: Not on file  . Years of education: Not on file  . Highest education level: Not on file  Occupational History  . Not on file  Social Needs  . Financial resource strain: Not on file  . Food insecurity:    Worry: Not on file    Inability: Not on file  . Transportation needs:    Medical: Not on file    Non-medical: Not on file  Tobacco Use  . Smoking status: Former Games developer  . Smokeless tobacco: Never Used  Substance and Sexual Activity  . Alcohol use: No  . Drug use: No  . Sexual activity: Yes  Lifestyle  . Physical activity:    Days per week: Not on file    Minutes per session: Not on file  . Stress: Not on file  Relationships  . Social connections:    Talks on phone: Not on file    Gets together: Not on file    Attends religious service:  Not on file    Active member of club or organization: Not on file    Attends meetings of clubs or organizations: Not on file    Relationship status: Not on file  . Intimate partner violence:    Fear of current or ex partner: Not on file    Emotionally abused: Not on file    Physically abused: Not on file    Forced sexual activity: Not on file  Other Topics Concern  . Not on file  Social History Narrative   HS ed    Married    2 kids    Former smoker    Wears seat belt    Safe in relationship   Current Meds  Medication Sig  . Vitamin D, Ergocalciferol, (DRISDOL) 50000 units CAPS capsule Take 1 capsule (50,000 Units total) by mouth 2 (two) times a week.  . [DISCONTINUED] Cholecalciferol 50000 units capsule Take 1 capsule (50,000 Units total) by mouth once a week.  . [DISCONTINUED] medroxyPROGESTERone (PROVERA) 10 MG tablet Take 1 tablet (10 mg total) by mouth daily. Use for ten days  . [DISCONTINUED]  norethindrone (ORTHO MICRONOR) 0.35 MG tablet Take 1 tablet (0.35 mg total) by mouth daily.   Allergies  Allergen Reactions  . Pineapple Shortness Of Breath    Pt states her throat shuts completely. Anything pineapple, pt states it is juice, the whole fruit, or flavoring.   . Sulfa Antibiotics Shortness Of Breath    Pt states her throat closes as well, tightness.   . Latex Itching    Pt states her throat also starts closing.   . Vicodin [Hydrocodone-Acetaminophen] Other (See Comments)    Hallucinations.   . Codeine Rash    Pt states it feels like her skin is coming off.   . Penicillins Rash    Pt states it feels like her skin is coming off.    Recent Results (from the past 2160 hour(s))  Group A Strep by PCR     Status: None   Collection Time: 02/16/18  8:45 PM  Result Value Ref Range   Group A Strep by PCR NOT DETECTED NOT DETECTED    Comment: Performed at [facility], [address] [place], [place] [address]  Group A Strep by PCR     Status: None   Collection Time: 02/18/18  4:10 PM  Result Value Ref Range   Group A Strep by PCR NOT DETECTED NOT DETECTED    Comment: Performed at [facility], [address] [place], [place] [address]  RPR     Status: None   Collection Time: 02/18/18  4:10 PM  Result Value Ref Range   RPR Ser Ql Non Reactive Non Reactive    Comment: (NOTE) Performed At: [facility]  [address] [place], [place] [id number] Jolene SchimkeNagendra Sanjai MD ON:[id number]:[phone]    Objective  Body mass index is 52.97 kg/m. Wt Readings from Last 3 Encounters:  05/04/18 (!) 308 lb 9.6 oz (140 kg)  02/18/18 (!) 304 lb 3.8 oz (138 kg)  02/16/18 (!) 307 lb 15.7 oz (139.7 kg)   Temp Readings from Last 3 Encounters:  05/04/18 98.4 F (36.9 C) (Oral)  02/18/18 98.7 F (37.1 C) (Oral)  02/16/18 98.5 F (36.9 C) (Oral)   BP Readings from Last 3 Encounters:  05/04/18 110/66  02/18/18 125/78  02/16/18 (!) 111/52   Pulse Readings from Last 3  Encounters:  05/04/18 80  02/18/18 73  02/16/18 83    Physical  Exam Vitals signs and nursing note reviewed.  Constitutional:      Appearance: Normal appearance.  HENT:     Head: Normocephalic and atraumatic.     Nose: Nose normal.     Mouth/Throat:     Mouth: Mucous membranes are moist.     Pharynx: Oropharynx is clear.  Eyes:     Conjunctiva/sclera: Conjunctivae normal.     Pupils: Pupils are equal, round, and reactive to light.  Cardiovascular:     Rate and Rhythm: Normal rate and regular rhythm.     Heart sounds: Normal heart sounds.  Pulmonary:     Effort: Pulmonary effort is normal.     Breath sounds: Normal breath sounds.  Skin:    General: Skin is warm and dry.  Neurological:     General: No focal deficit present.     Mental Status: She is alert and oriented to person, place, and time.     Gait: Gait normal.  Psychiatric:        Attention and Perception: Attention and perception normal.        Mood and Affect: Mood and affect normal.        Speech: Speech normal.        Behavior: Behavior normal. Behavior is cooperative.        Thought Content: Thought content normal.        Cognition and Memory: Cognition and memory normal.        Judgment: Judgment normal.     Assessment   1. Anxiety/panic, ocd and depression phq 9 score 23 and gad 7 score 20 today. Recent grief  2. contraception off OCP and depo today  3. HM Plan   1. Add zoloft 50 mg qd and wellbutrin xl 150 mg qd  F/u in 4-6 weeks  Referral to [employer] insurance does not start until 07/2018 pt may sch before then  2. Off doing withdrawal method  3.  Declines flu shot  Tdap had 12/2008  Consider hep B vaccine  Check labs CMET had 01/06/18, CBC had 08/05/17, A1C 5.5 01/06/18 TSH, T4, T3 normal 01/06/18, UA had 01/06/18, hep B titer, vit D def 12.30 08/05/17  consider rcheck lipid in future with labs  Declines STD check  Pap had 2018 [facility] or [facility] no h/o abnormal need to get copy or report   LMP 07/26/17  Nexplanon removed 2 months ago   Pt had ED visit [facility] 08/29/17 dizziness CT head ordered, labs EKG see note   Provider: Dr. French Anaracy McLean-Scocuzza-Internal Medicine

## 2018-05-05 ENCOUNTER — Encounter: Payer: Self-pay | Admitting: Internal Medicine

## 2018-05-05 ENCOUNTER — Other Ambulatory Visit: Payer: Self-pay | Admitting: Internal Medicine

## 2018-05-05 DIAGNOSIS — F329 Major depressive disorder, single episode, unspecified: Secondary | ICD-10-CM

## 2018-05-05 DIAGNOSIS — F32A Depression, unspecified: Secondary | ICD-10-CM

## 2018-05-05 DIAGNOSIS — F419 Anxiety disorder, unspecified: Principal | ICD-10-CM

## 2018-05-05 MED ORDER — FLUOXETINE HCL 20 MG PO CAPS: 20.0000 mg | ORAL_CAPSULE | Freq: Every day | ORAL | 0 refills | Status: DC

## 2018-05-08 ENCOUNTER — Encounter: Payer: 59 | Admitting: Certified Nurse Midwife

## 2018-05-09 ENCOUNTER — Other Ambulatory Visit: Payer: Self-pay

## 2018-05-09 ENCOUNTER — Emergency Department
Admission: EM | Admit: 2018-05-09 | Discharge: 2018-05-09 | Disposition: A | Discharge status: Home / Self Care | Payer: 59 | Attending: Emergency Medicine | Admitting: Emergency Medicine

## 2018-05-09 ENCOUNTER — Encounter: Payer: Self-pay | Admitting: Emergency Medicine

## 2018-05-09 ENCOUNTER — Other Ambulatory Visit: Payer: Self-pay | Admitting: Internal Medicine

## 2018-05-09 DIAGNOSIS — F419 Anxiety disorder, unspecified: Secondary | ICD-10-CM

## 2018-05-09 DIAGNOSIS — Z91040 Latex allergy status: Secondary | ICD-10-CM | POA: Insufficient documentation

## 2018-05-09 DIAGNOSIS — N390 Urinary tract infection, site not specified: Secondary | ICD-10-CM

## 2018-05-09 DIAGNOSIS — Z79899 Other long term (current) drug therapy: Secondary | ICD-10-CM | POA: Insufficient documentation

## 2018-05-09 DIAGNOSIS — Z87891 Personal history of nicotine dependence: Secondary | ICD-10-CM | POA: Insufficient documentation

## 2018-05-09 DIAGNOSIS — F429 Obsessive-compulsive disorder, unspecified: Secondary | ICD-10-CM | POA: Diagnosis present

## 2018-05-09 DIAGNOSIS — F321 Major depressive disorder, single episode, moderate: Secondary | ICD-10-CM

## 2018-05-09 DIAGNOSIS — F329 Major depressive disorder, single episode, unspecified: Secondary | ICD-10-CM | POA: Insufficient documentation

## 2018-05-09 LAB — URINALYSIS, COMPLETE (UACMP) WITH MICROSCOPIC
BILIRUBIN URINE: NEGATIVE
GLUCOSE, UA: NEGATIVE mg/dL
Hgb urine dipstick: NEGATIVE
Ketones, ur: 5 mg/dL — AB
Nitrite: POSITIVE — AB
PH: 5 (ref 5.0–8.0)
Protein, ur: 30 mg/dL — AB
SPECIFIC GRAVITY, URINE: 1.023 (ref 1.005–1.030)
WBC, UA: >50 WBC/hpf — ABNORMAL HIGH (ref 0–5)

## 2018-05-09 LAB — CBC WITH DIFFERENTIAL/PLATELET
Abs Immature Granulocytes: 0.01 10*3/uL (ref 0.00–0.07)
BASOS PCT: 0 %
Basophils Absolute: 0 10*3/uL (ref 0.0–0.1)
EOS ABS: 0 10*3/uL (ref 0.0–0.5)
Eosinophils Relative: 1 %
HCT: 46.3 % — ABNORMAL HIGH (ref 36.0–46.0)
Hemoglobin: 15.2 g/dL — ABNORMAL HIGH (ref 12.0–15.0)
IMMATURE GRANULOCYTES: 0 %
Lymphocytes Relative: 17 %
Lymphs Abs: 1.3 10*3/uL (ref 0.7–4.0)
MCH: 29.2 pg (ref 26.0–34.0)
MCHC: 32.8 g/dL (ref 30.0–36.0)
MCV: 88.9 fL (ref 80.0–100.0)
MONOS PCT: 5 %
Monocytes Absolute: 0.4 10*3/uL (ref 0.1–1.0)
NEUTROS ABS: 6 10*3/uL (ref 1.7–7.7)
NEUTROS PCT: 77 %
NRBC: 0 % (ref 0.0–0.2)
Platelets: 328 10*3/uL (ref 150–400)
RBC: 5.21 MIL/uL — AB (ref 3.87–5.11)
RDW: 12.8 % (ref 11.5–15.5)
WBC: 7.8 10*3/uL (ref 4.0–10.5)

## 2018-05-09 LAB — BASIC METABOLIC PANEL
ANION GAP: 7 (ref 5–15)
BUN: 11 mg/dL (ref 6–20)
CALCIUM: 9.4 mg/dL (ref 8.9–10.3)
CO2: 22 mmol/L (ref 22–32)
Chloride: 108 mmol/L (ref 98–111)
Creatinine, Ser: 0.98 mg/dL (ref 0.44–1.00)
GLUCOSE: 119 mg/dL — AB (ref 70–99)
POTASSIUM: 3.9 mmol/L (ref 3.5–5.1)
SODIUM: 137 mmol/L (ref 135–145)

## 2018-05-09 MED ORDER — ALPRAZOLAM 0.25 MG PO TABS: 0.2500 mg | ORAL_TABLET | Freq: Every day | ORAL | 0 refills | Status: DC | PRN

## 2018-05-09 MED ORDER — FLUOXETINE HCL 20 MG PO CAPS: 20.0000 mg | ORAL_CAPSULE | Freq: Every day | ORAL | 2 refills | Status: DC

## 2018-05-09 MED ORDER — CEPHALEXIN 500 MG PO CAPS: 500.0000 mg | ORAL_CAPSULE | Freq: Two times a day (BID) | ORAL | 0 refills | Status: DC

## 2018-05-09 MED ORDER — CEPHALEXIN 500 MG PO CAPS: 500.0000 mg | ORAL_CAPSULE | Freq: Two times a day (BID) | ORAL | 0 refills | Status: AC

## 2018-05-09 NOTE — Discharge Instructions (Addendum)
Discontinue the Wellbutrin and Zoloft, and switch to Prozac as prescribed by Dr. Toni Amendlapacs.  Take the Keflex as prescribed for the urinary tract infection and finish the full course.  Follow-up with your primary care doctor as discussed.  Return to the ER for new, worsening, or persistent severe anxiety, abdominal or back pain, fever, weakness, or any other new or worsening symptoms that concern you.

## 2018-05-09 NOTE — ED Notes (Signed)
Pt states still anxious. Tapping R foot in bed.

## 2018-05-09 NOTE — ED Triage Notes (Signed)
Pt presents via POV. Pt states father died on 12/19, was placed on Zoloft, Wellbutrin, and Xanax. Pt states hx of OCD. Pt states here due to lack of appetite, diarrhea, and her "mental state". 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 Pt states has thoughts about it but knowingly would never hurt anyone.

## 2018-05-09 NOTE — ED Notes (Signed)
Pt c/o n/v, can't sleep, can't eat/drink, feels like she is going "crazy." Denies thoughts of suicide or harming self but requests a psych eval. States hist of "OCD that makes me have thoughts of being harmed but not actually harming myself." Pt's mother at bedside.

## 2018-05-09 NOTE — ED Notes (Signed)
Pt states she feels "a little less anxious." Given warm blanket as requested. Denies any other needs.

## 2018-05-09 NOTE — ED Provider Notes (Signed)
[facility] Emergency Department Provider Note ____________________________________________   First MD Initiated Contact with Patient 05/09/18 1140     (approximate)  I have reviewed the triage vital signs and the nursing notes.   HISTORY  Chief Complaint Medication Reaction    HPI Sara Boyd is a 27 y.o. female with PMH as noted below who presents with anxiety over the last several days, worsening course, associated with decreased appetite, insomnia, as well as some body aches and bilateral lower back pain which she thinks is coming from her kidneys.  The patient states that she has a history of OCD and anxiety and used to be on medication years ago.  Her father passed away on 10-May-2018 and she states that her anxiety was exacerbated.  She saw her primary doctor who started her on Wellbutrin, Zoloft, and Xanax several days ago but these have not helped.  The patient denies any active thoughts of wanting to hurt herself or anyone else.  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   Past Medical History:  Diagnosis Date  . Anxiety   . Chicken pox   . Depression   . Endometritis   . GERD (gastroesophageal reflux disease)   . Headache   . Medical history non-contributory   . Migraine   . OCD (obsessive compulsive disorder)   . Retained products of conception following abortion   . UTI (urinary tract infection)     Patient Active Problem List   Diagnosis Date Noted  . Anxiety and depression 05/04/2018  . Panic disorder 05/04/2018  . Obsessive-compulsive disorder 05/04/2018  . Chronic UTI 08/08/2017  . Chest pain 08/08/2017  . Anxiety 08/08/2017  . Myalgia 08/08/2017  . Gastroesophageal reflux disease 08/08/2017  . Class 3 severe obesity due to  excess calories with body mass index (BMI) of 50.0 to 59.9 in adult (HCC) 08/08/2017  . Palpitations 08/08/2017  . Fatigue 08/08/2017  . Vitamin D deficiency 08/07/2017    Past Surgical History:  Procedure Laterality Date  . BLADDER SURGERY    . DILATION AND CURETTAGE OF UTERUS     elective AB  . DILATION AND EVACUATION N/A 10/29/2016   Procedure: DILATATION AND EVACUATION;  Surgeon: Nadara Mustard, MD;  Location: [facility] ORS;  Service: Gynecology;  Laterality: N/A;  . HERNIA REPAIR    . HERNIA REPAIR     2011  . TONSILLECTOMY     1998    Prior to Admission medications   Medication Sig Start Date End Date Taking? Authorizing Provider  ALPRAZolam (XANAX) 0.25 MG tablet Take 1 tablet (0.25 mg total) by mouth daily as needed for anxiety. 05/09/18   McLean-Scocuzza, Pasty Spillers, MD  cephALEXin (KEFLEX) 500 MG capsule Take 1 capsule (500 mg total) by mouth 2 (two) times daily for 7 days. 05/09/18 05/16/18  Dionne Bucy, MD  FLUoxetine (PROZAC) 20 MG capsule Take 1 capsule (20 mg total) by mouth daily. 05/09/18 08/07/18  Clapacs, Jackquline Denmark, MD  Vitamin D, Ergocalciferol, (DRISDOL) 50000 units CAPS capsule Take 1 capsule (50,000 Units total) by mouth 2 (two) times a week. 01/09/18   Shambley, Melody N, CNM    Allergies Pineapple; Sulfa antibiotics; Latex; Vicodin [hydrocodone-acetaminophen]; Codeine; and Penicillins  Family History  Problem Relation Age of Onset  . Cancer  Mother        NHL  . Hyperlipidemia Mother   . Hypertension Mother   . COPD Father   . Heart disease Father   . Hypertension Father   . Hyperlipidemia Father   . Diabetes Maternal Grandmother   . Heart disease Maternal Grandfather   . Cancer Paternal Grandmother        breast  . Early death Paternal Grandfather   . Heart disease Paternal Grandfather     Social History Social History   Tobacco Use  . Smoking status: Former Games developermoker  . Smokeless tobacco: Never Used  Substance Use Topics  . Alcohol use: No  .  Drug use: No    Review of Systems  Constitutional: No fever. Eyes: No redness. ENT: No sore throat. Cardiovascular: Denies chest pain. Respiratory: Denies shortness of breath. Gastrointestinal: Positive for nausea.  Genitourinary: Negative for dysuria.  Musculoskeletal: Positive for back pain. Skin: Negative for rash. Neurological: Negative for headache.   ____________________________________________   PHYSICAL EXAM:  VITAL SIGNS: ED Triage Vitals  Enc Vitals Group     BP 05/09/18 1129 (!) 122/59     Pulse Rate 05/09/18 1129 79     Resp 05/09/18 1129 18     Temp 05/09/18 1129 98.4 F (36.9 C)     Temp Source 05/09/18 1129 Oral     SpO2 05/09/18 1129 98 %     Weight 05/09/18 1128 (!) 308 lb (139.7 kg)     Height 05/09/18 1128 5\' 4"  (1.626 m)     Head Circumference --      Peak Flow --      Pain Score 05/09/18 1128 0     Pain Loc --      Pain Edu? --      Excl. in GC? --     Constitutional: Alert and oriented.  Anxious appearing but in no acute distress. Eyes: Conjunctivae are normal.  Head: Atraumatic. Nose: No congestion/rhinnorhea. Mouth/Throat: Mucous membranes are slightly dry.   Neck: Normal range of motion.  Cardiovascular: Normal rate, regular rhythm. Grossly normal heart sounds.  Good peripheral circulation. Respiratory: Normal respiratory effort.  No retractions. Lungs CTAB. Gastrointestinal:  No distention.  Genitourinary: No CVA tenderness. Musculoskeletal: Extremities warm and well perfused.  Neurologic:  Normal speech and language. No gross focal neurologic deficits are appreciated.  Skin:  Skin is warm and dry. No rash noted. Psychiatric: Anxious appearing.  Speech and behavior are normal.  ____________________________________________   LABS (all labs ordered are listed, but only abnormal results are displayed)  Labs Reviewed  BASIC METABOLIC PANEL - Abnormal; Notable for the following components:      Result Value   Glucose, Bld 119 (*)      All other components within normal limits  CBC WITH DIFFERENTIAL/PLATELET - Abnormal; Notable for the following components:   RBC 5.21 (*)    Hemoglobin 15.2 (*)    HCT 46.3 (*)    All other components within normal limits  URINALYSIS, COMPLETE (UACMP) WITH MICROSCOPIC - Abnormal; Notable for the following components:   Color, Urine AMBER (*)    APPearance CLOUDY (*)    Ketones, ur 5 (*)    Protein, ur 30 (*)    Nitrite POSITIVE (*)    Leukocytes, UA LARGE (*)    WBC, UA >50 (*)    Bacteria, UA MANY (*)    All other components within normal limits   ____________________________________________  EKG   ____________________________________________  RADIOLOGY  ____________________________________________   PROCEDURES  Procedure(s) performed: No  Procedures  Critical Care performed: No ____________________________________________   INITIAL IMPRESSION / ASSESSMENT AND PLAN / ED COURSE  Pertinent labs & imaging results that were available during my care of the patient were reviewed by me and considered in my medical decision making (see chart for details).  27 year old female with PMH as noted above presents with anxiety as well as decreased appetite, insomnia, and back pain after being recently started on Wellbutrin, Zoloft, and Xanax and after the passing of her father on 04/27/2018.  On exam, the patient is anxious but otherwise well-appearing.  The remainder of the exam is unremarkable.  Her vital signs are normal.  Overall I suspect anxiety related to the patient's recent stressors.  It is possible that the symptoms are somewhat exacerbated by acute effects of the medications.  At this time the patient does not demonstrate danger to self or others and can contract for safety.  She is here voluntarily for evaluation.  We will obtain basic labs and a UA to rule out any acute medical etiology or complications related to decreased p.o. intake.  I will order  psychiatry consultation to further evaluate and determine if the patient needs any medication changes.  ----------------------------------------- 4:02 PM on 05/09/2018 -----------------------------------------  The patient was evaluated by Dr. Toni Amendlapacs.  He is cleared for discharge home.  He instructed her to discontinue the Zoloft and Wellbutrin and has changed her Prozac.  The lab work-up is unremarkable except that the patient does have an apparent UTI.  This is consistent with her symptoms.  I will treat empirically with Keflex.  The patient feels comfortable and is stable for discharge home.  I counseled her on the results of the work-up.  Return precautions given, and she expresses understanding. ____________________________________________   FINAL CLINICAL IMPRESSION(S) / ED DIAGNOSES  Final diagnoses:  Urinary tract infection without hematuria, site unspecified  Anxiety      NEW MEDICATIONS STARTED DURING THIS VISIT:  Discharge Medication List as of 05/09/2018  2:55 PM    START taking these medications   Details  FLUoxetine (PROZAC) 20 MG capsule Take 1 capsule (20 mg total) by mouth daily., Starting Tue 05/09/2018, Until Mon 08/07/2018, Print         Note:  This document was prepared using Dragon voice recognition software and may include unintentional dictation errors.    Dionne BucySiadecki, Konstantinos Cordoba, MD 05/09/18 [phone]

## 2018-05-09 NOTE — ED Notes (Signed)
Pt promises RN she will leave IV alone and tell RN if she thinks of harming self.

## 2018-05-09 NOTE — Consult Note (Signed)
[facility]   Reason for Consult: Consult for this 27 year old woman who presents to the emergency room with concerns about anxiety Referring Physician: Siadecki Patient Identification: Sara Mylarlizabeth Nichole Turney MRN:  [id number] Principal Diagnosis: Obsessive-compulsive disorder Diagnosis:  Principal Problem:   Obsessive-compulsive disorder Active Problems:   Episode of moderate major depression (HCC)   Total Time spent with patient: 1 hour  Subjective:   Sara Boyd is a 27 y.o. female patient admitted with "I feel like maybe I am going crazy".  HPI: Patient seen chart reviewed.  This is a 27 year old woman who presents to the emergency room with symptoms of anxiety and bad mood.  She starts the story with the death of her father on December 19.  He had been sick for a long time but the death has still been emotionally very difficult for her.  Almost immediately after he passed she began to have worsening anxiety symptoms.  She has intrusive thoughts often feeling that somehow she is going to hurt someone or hurt herself.  She finds these very disturbing and cannot get them out of her head.  She is not eating or sleeping very well.  She denies having any actual wish to hurt herself or anyone else.  In addition to the death of her father she is separated from her husband.  Has 2 young children at home with her she is caring for.  Patient went to see her primary care doctor and was prescribed Zoloft Wellbutrin and Xanax.  Since starting them she says she feels even worse.  Feels like she is going crazy because of how strongly the thoughts are racing in her head.  She is not however hyperactive mood is certainly not elevated.  Patient denies alcohol or drug abuse.  Social history: Separated from her husband taking care of 2 young children at home.  She does have a job but has not been able to work in a couple weeks because of the acute symptoms.  Medical  history: Does not have other significant medical problems outside of mental health  Substance abuse history: Denies alcohol or drug abuse no past history of substance abuse problems  Past Psychiatric History: We have seen this patient previously in the hospital the last note was in 2015 at that time she presented with almost an identical circumstance.  The treatment at that time was to restart the Prozac that she had taken previously with good effect.  Patient does not have a history of hospitalization does not have a history of suicide attempts.  It sounds like she first had an episode of this kind of symptomatology when she was pregnant and only took medicine briefly before stopping it.  She does not remember whether she was compliant or for how long after I saw her in 2015.  Risk to Self:   Risk to Others:   Prior Inpatient Therapy:   Prior Outpatient Therapy:    Past Medical History:  Past Medical History:  Diagnosis Date  . Anxiety   . Chicken pox   . Depression   . Endometritis   . GERD (gastroesophageal reflux disease)   . Headache   . Medical history non-contributory   . Migraine   . OCD (obsessive compulsive disorder)   . Retained products of conception following abortion   . UTI (urinary tract infection)     Past Surgical History:  Procedure Laterality Date  . BLADDER SURGERY    . DILATION AND CURETTAGE OF UTERUS  elective AB  . DILATION AND EVACUATION N/A 10/29/2016   Procedure: DILATATION AND EVACUATION;  Surgeon: Nadara Mustard, MD;  Location: [facility] ORS;  Service: Gynecology;  Laterality: N/A;  . HERNIA REPAIR    . HERNIA REPAIR     2011  . TONSILLECTOMY     1998   Family History:  Family History  Problem Relation Age of Onset  . Cancer Mother        NHL  . Hyperlipidemia Mother   . Hypertension Mother   . COPD Father   . Heart disease Father   . Hypertension Father   . Hyperlipidemia Father   . Diabetes Maternal Grandmother   . Heart disease  Maternal Grandfather   . Cancer Paternal Grandmother        breast  . Early death Paternal Grandfather   . Heart disease Paternal Grandfather    Family Psychiatric  History: She believes her father had "bipolar disorder" but her evidence for this is just her observation not any actual diagnosis Social History:  Social History   Substance and Sexual Activity  Alcohol Use No     Social History   Substance and Sexual Activity  Drug Use No    Social History   Socioeconomic History  . Marital status: Married    Spouse name: Not on file  . Number of children: Not on file  . Years of education: Not on file  . Highest education level: Not on file  Occupational History  . Not on file  Social Needs  . Financial resource strain: Not on file  . Food insecurity:    Worry: Not on file    Inability: Not on file  . Transportation needs:    Medical: Not on file    Non-medical: Not on file  Tobacco Use  . Smoking status: Former Games developer  . Smokeless tobacco: Never Used  Substance and Sexual Activity  . Alcohol use: No  . Drug use: No  . Sexual activity: Yes  Lifestyle  . Physical activity:    Days per week: Not on file    Minutes per session: Not on file  . Stress: Not on file  Relationships  . Social connections:    Talks on phone: Not on file    Gets together: Not on file    Attends religious service: Not on file    Active member of club or organization: Not on file    Attends meetings of clubs or organizations: Not on file    Relationship status: Not on file  Other Topics Concern  . Not on file  Social History Narrative   HS ed    Married    2 kids    Former smoker    Wears seat belt    Safe in relationship   Additional Social History:    Allergies:   Allergies  Allergen Reactions  . Pineapple Shortness Of Breath    Pt states her throat shuts completely. Anything pineapple, pt states it is juice, the whole fruit, or flavoring.   . Sulfa Antibiotics Shortness  Of Breath    Pt states her throat closes as well, tightness.   . Latex Itching    Pt states her throat also starts closing.   . Vicodin [Hydrocodone-Acetaminophen] Other (See Comments)    Hallucinations.   . Codeine Rash    Pt states it feels like her skin is coming off.   . Penicillins Rash    Pt states it feels  like her skin is coming off.     Labs:  Results for orders placed or performed during the hospital encounter of 05/09/18 (from the past 48 hour(s))  Basic metabolic panel     Status: Abnormal   Collection Time: 05/09/18 12:00 PM  Result Value Ref Range   Sodium 137 135 - 145 mmol/L   Potassium 3.9 3.5 - 5.1 mmol/L   Chloride 108 98 - 111 mmol/L   CO2 22 22 - 32 mmol/L   Glucose, Bld 119 (H) 70 - 99 mg/dL   BUN 11 6 - 20 mg/dL   Creatinine, Ser 1.30 0.44 - 1.00 mg/dL   Calcium 9.4 8.9 - 86.5 mg/dL   GFR calc non Af Amer >60 >60 mL/min   GFR calc Af Amer >60 >60 mL/min   Anion gap 7 5 - 15    Comment: Performed at [facility], [address] [place], [place] [address]  CBC with Differential     Status: Abnormal   Collection Time: 05/09/18 12:00 PM  Result Value Ref Range   WBC 7.8 4.0 - 10.5 K/uL   RBC 5.21 (H) 3.87 - 5.11 MIL/uL   Hemoglobin 15.2 (H) 12.0 - 15.0 g/dL   HCT 62.9 (H) 52.8 - 41.3 %   MCV 88.9 80.0 - 100.0 fL   MCH 29.2 26.0 - 34.0 pg   MCHC 32.8 30.0 - 36.0 g/dL   RDW 24.4 01.0 - 27.2 %   Platelets 328 150 - 400 K/uL   nRBC 0.0 0.0 - 0.2 %   Neutrophils Relative % 77 %   Neutro Abs 6.0 1.7 - 7.7 K/uL   Lymphocytes Relative 17 %   Lymphs Abs 1.3 0.7 - 4.0 K/uL   Monocytes Relative 5 %   Monocytes Absolute 0.4 0.1 - 1.0 K/uL   Eosinophils Relative 1 %   Eosinophils Absolute 0.0 0.0 - 0.5 K/uL   Basophils Relative 0 %   Basophils Absolute 0.0 0.0 - 0.1 K/uL   Immature Granulocytes 0 %   Abs Immature Granulocytes 0.01 0.00 - 0.07 K/uL    Comment: Performed at [facility], [address] [place], [place] [address]   Urinalysis, Complete w Microscopic     Status: Abnormal   Collection Time: 05/09/18 12:20 PM  Result Value Ref Range   Color, Urine AMBER (A) YELLOW    Comment: BIOCHEMICALS MAY BE AFFECTED BY COLOR   APPearance CLOUDY (A) CLEAR   Specific Gravity, Urine 1.023 1.005 - 1.030   pH 5.0 5.0 - 8.0   Glucose, UA NEGATIVE NEGATIVE mg/dL   Hgb urine dipstick NEGATIVE NEGATIVE   Bilirubin Urine NEGATIVE NEGATIVE   Ketones, ur 5 (A) NEGATIVE mg/dL   Protein, ur 30 (A) NEGATIVE mg/dL   Nitrite POSITIVE (A) NEGATIVE   Leukocytes, UA LARGE (A) NEGATIVE   RBC / HPF 0-5 0 - 5 RBC/hpf   WBC, UA >50 (H) 0 - 5 WBC/hpf   Bacteria, UA MANY (A) NONE SEEN   Squamous Epithelial / LPF 11-20 0 - 5   Mucus PRESENT     Comment: Performed at [facility], [address] [place], [place] [address]    No current facility-administered medications for this encounter.    Current Outpatient Medications  Medication Sig Dispense Refill  . ALPRAZolam (XANAX) 0.25 MG tablet Take 1 tablet (0.25 mg total) by mouth daily as needed for anxiety. 30 tablet 0  . cephALEXin (KEFLEX) 500 MG capsule Take 1 capsule (500  mg total) by mouth 2 (two) times daily for 7 days. 14 capsule 0  . FLUoxetine (PROZAC) 20 MG capsule Take 1 capsule (20 mg total) by mouth daily. 30 capsule 2  . Vitamin D, Ergocalciferol, (DRISDOL) 50000 units CAPS capsule Take 1 capsule (50,000 Units total) by mouth 2 (two) times a week. 30 capsule 1    Musculoskeletal: Strength & Muscle Tone: within normal limits Gait & Station: normal Patient leans: N/A  Psychiatric Specialty Exam: Physical Exam  Nursing note and vitals reviewed. Constitutional: She appears well-developed and well-nourished.  HENT:  Head: Normocephalic and atraumatic.  Eyes: Pupils are equal, round, and reactive to light. Conjunctivae are normal.  Neck: Normal range of motion.  Cardiovascular: Regular rhythm and normal heart sounds.  Respiratory: Effort normal. No  respiratory distress.  GI: Soft.  Musculoskeletal: Normal range of motion.  Neurological: She is alert.  Skin: Skin is warm and dry.  Psychiatric: Her speech is normal and behavior is normal. Judgment and thought content normal. Her mood appears anxious. Cognition and memory are normal.    Review of Systems  Constitutional: Negative.   HENT: Negative.   Eyes: Negative.   Respiratory: Negative.   Cardiovascular: Negative.   Gastrointestinal: Negative.   Musculoskeletal: Negative.   Skin: Negative.   Neurological: Negative.   Psychiatric/Behavioral: Positive for depression. Negative for hallucinations, memory loss, substance abuse and suicidal ideas. The patient is nervous/anxious and has insomnia.     Blood pressure 133/81, pulse 78, temperature 98.5 F (36.9 C), temperature source Oral, resp. rate 18, height 5\' 4"  (1.626 m), weight (!) 139.7 kg, last menstrual period 04/27/2018, SpO2 100 %, unknown if currently breastfeeding.Body mass index is 52.87 kg/m.  General Appearance: Fairly Groomed  Eye Contact:  Good  Speech:  Clear and Coherent  Volume:  Normal  Mood:  Anxious  Affect:  Congruent  Thought Process:  Coherent and Goal Directed  Orientation:  Full (Time, Place, and Person)  Thought Content:  Logical, Obsessions and Rumination  Suicidal Thoughts:  No  Homicidal Thoughts:  No  Memory:  Immediate;   Fair Recent;   Fair Remote;   Fair  Judgement:  Fair  Insight:  Fair  Psychomotor Activity:  Normal  Concentration:  Concentration: Fair  Recall:  FiservFair  Fund of Knowledge:  Fair  Language:  Fair  Akathisia:  No  Handed:  Right  AIMS (if indicated):     Assets:  Desire for Improvement Housing Physical Health Resilience Social Support  ADL's:  Intact  Cognition:  WNL  Sleep:        Treatment Plan Summary: Medication management and Plan 27 year old woman who presents once again with symptoms that are consistent with obsessive-compulsive disorder also with some  degree of depression.  Patient is not psychotic.  Although she has intrusive thoughts about injury she has no history of violence and no interest in violence and he is not at all aggressive.  I do not think she represents an elevated risk of suicide or homicide.  Spent some time talking with the patient about the illness and reassuring her that I did not think that she was developing a psychotic disorder.  I suggest that she discontinue the Wellbutrin which may be making her anxiety worse and also discontinue the Zoloft and restart fluoxetine 20 mg a day.  Spoke with her about how it may take weeks for the symptoms to get better.  She can continue the very low-dose Xanax for now.  She can follow-up  with her primary care doctor but I would also recommend that she use her insurance and look into finding a local psychotherapist and may be getting her doctor to refer her to a psychiatrist.  Patient is agreeable to the plan.  Case reviewed with emergency room doctor and TTS.  Disposition: No evidence of imminent risk to self or others at present.   Patient does not meet criteria for psychiatric inpatient admission. Supportive therapy provided about ongoing stressors.  Mordecai Rasmussen, MD 05/09/2018 4:14 PM

## 2018-05-09 NOTE — ED Notes (Signed)
Pt to restroom to obtain urine sample

## 2018-05-10 NOTE — L&D Delivery Note (Signed)
       Delivery Note   Sara Boyd is a 28 y.o. [id number] at [redacted]w[redacted]d Estimated Date of Delivery: 04/25/19  PRE-OPERATIVE DIAGNOSIS:  1) [redacted]w[redacted]d pregnancy.  2) Increased BMI  POST-OPERATIVE DIAGNOSIS:  1) [redacted]w[redacted]d pregnancy s/p Vaginal, Spontaneous  2) Viable female infant  Delivery Type: Vaginal, Spontaneous    Delivery Anesthesia: Epidural   Labor Complications:      ESTIMATED BLOOD LOSS: 100  ml    FINDINGS:   1) female infant, Apgar scores of 9   at 1 minute and 9   at 5 minutes and a birthweight of 139.68  ounces.    2) Nuchal cord: No  SPECIMENS:   PLACENTA:   Appearance: Intact    Removal: Spontaneous      Disposition:    DISPOSITION:  Infant to left in stable condition in the delivery room, with L&D personnel and mother,  NARRATIVE SUMMARY: Labor course:  Ms. Sara Boyd is a [id number] at [redacted]w[redacted]d who presented for induction of labor.  She received 2 doses of misoprostol without cervical change or contractions.  Pitocin was begun.  After SROM clear fluid complete internal monitors were placed. She then began to have painful contractions.  An epidural was placed.  The then progressed in labor.  She proceeded to complete dilation. She evidenced good maternal expulsive effort during the second stage. She went on to deliver a viable infant. The placenta delivered without problems and was noted to be complete. A perineal and vaginal examination was performed. Episiotomy/Lacerations: None    Finis Bud, M.D. 04/26/2019 6:51 AM

## 2018-05-18 ENCOUNTER — Ambulatory Visit: Payer: 59 | Admitting: Internal Medicine

## 2018-06-08 ENCOUNTER — Ambulatory Visit: Payer: 59 | Admitting: Internal Medicine

## 2018-06-08 DIAGNOSIS — Z0289 Encounter for other administrative examinations: Secondary | ICD-10-CM

## 2018-06-14 ENCOUNTER — Other Ambulatory Visit: Payer: Self-pay

## 2018-06-14 ENCOUNTER — Emergency Department
Admission: EM | Admit: 2018-06-14 | Discharge: 2018-06-14 | Disposition: A | Discharge status: Home / Self Care | Payer: Self-pay | Attending: Emergency Medicine | Admitting: Emergency Medicine

## 2018-06-14 DIAGNOSIS — H6503 Acute serous otitis media, bilateral: Secondary | ICD-10-CM | POA: Insufficient documentation

## 2018-06-14 DIAGNOSIS — Z79899 Other long term (current) drug therapy: Secondary | ICD-10-CM | POA: Insufficient documentation

## 2018-06-14 DIAGNOSIS — Z87891 Personal history of nicotine dependence: Secondary | ICD-10-CM | POA: Insufficient documentation

## 2018-06-14 DIAGNOSIS — J020 Streptococcal pharyngitis: Secondary | ICD-10-CM | POA: Insufficient documentation

## 2018-06-14 LAB — GROUP A STREP BY PCR: Group A Strep by PCR: NOT DETECTED

## 2018-06-14 MED ORDER — AZITHROMYCIN 250 MG PO TABS: ORAL_TABLET | ORAL | 0 refills | Status: DC

## 2018-06-14 NOTE — ED Triage Notes (Signed)
Sore throat since Monday. A&O, ambulatory. Speaking in complete sentences. Mask placed.

## 2018-06-14 NOTE — ED Provider Notes (Signed)
[facility] Emergency Department Provider Note ____________________________________________  Time seen: 1551  I have reviewed the triage vital signs and the nursing notes.  HISTORY  Chief Complaint  Sore Throat  HPI Sara Boyd is a 28 y.o. female presents today with complaints of sore throat pain for the last 2 to 3 days.  Patient reports her daughter was recently diagnosed treated for strep pharyngitis.  She reports a T-max of 79102 F.  She denies any nausea or vomiting.  Past Medical History:  Diagnosis Date  . Anxiety   . Chicken pox   . Depression   . Endometritis   . GERD (gastroesophageal reflux disease)   . Headache   . Medical history non-contributory   . Migraine   . OCD (obsessive compulsive disorder)   . Retained products of conception following abortion   . UTI (urinary tract infection)     Patient Active Problem List   Diagnosis Date Noted  . Episode of moderate major depression (HCC) 05/09/2018  . Anxiety and depression 05/04/2018  . Panic disorder 05/04/2018  . Obsessive-compulsive disorder 05/04/2018  . Chronic UTI 08/08/2017  . Chest pain 08/08/2017  . Anxiety 08/08/2017  . Myalgia 08/08/2017  . Gastroesophageal reflux disease 08/08/2017  . Class 3 severe obesity due to excess calories with body mass index (BMI) of 50.0 to 59.9 in adult (HCC) 08/08/2017  . Palpitations 08/08/2017  . Fatigue 08/08/2017  . Vitamin D deficiency 08/07/2017    Past Surgical History:  Procedure Laterality Date  . BLADDER SURGERY    . DILATION AND CURETTAGE OF UTERUS     elective AB  . DILATION AND EVACUATION N/A 10/29/2016   Procedure: DILATATION AND EVACUATION;  Surgeon: Nadara MustardHarris, Robert P, MD;  Location: [facility] ORS;  Service: Gynecology;  Laterality: N/A;  . HERNIA REPAIR    . HERNIA REPAIR     2011  . TONSILLECTOMY     1998    Prior to Admission medications   Medication Sig Start Date End Date Taking? Authorizing Provider   ALPRAZolam (XANAX) 0.25 MG tablet Take 1 tablet (0.25 mg total) by mouth daily as needed for anxiety. 05/09/18   McLean-Scocuzza, Pasty Spillersracy N, MD  azithromycin (ZITHROMAX Z-PAK) 250 MG tablet Take 2 tablets (500 mg) on  Day 1,  followed by 1 tablet (250 mg) once daily on Days 2 through 5. 06/14/18   Tiaunna Buford, Charlesetta IvoryJenise V Bacon, PA-C  FLUoxetine (PROZAC) 20 MG capsule Take 1 capsule (20 mg total) by mouth daily. 05/09/18 08/07/18  Clapacs, Jackquline DenmarkJohn T, MD  Vitamin D, Ergocalciferol, (DRISDOL) 50000 units CAPS capsule Take 1 capsule (50,000 Units total) by mouth 2 (two) times a week. 01/09/18   Shambley, Melody N, CNM    Allergies Pineapple; Sulfa antibiotics; Latex; Vicodin [hydrocodone-acetaminophen]; Codeine; and Penicillins  Family History  Problem Relation Age of Onset  . Cancer Mother        NHL  . Hyperlipidemia Mother   . Hypertension Mother   . COPD Father   . Heart disease Father   . Hypertension Father   . Hyperlipidemia Father   . Diabetes Maternal Grandmother   . Heart disease Maternal Grandfather   . Cancer Paternal Grandmother        breast  . Early death Paternal Grandfather   . Heart disease Paternal Grandfather     Social History Social History   Tobacco Use  . Smoking status: Former Games developermoker  . Smokeless tobacco: Never Used  Substance Use Topics  .  Alcohol use: No  . Drug use: No    Review of Systems  Constitutional: Positive for fever. Eyes: Negative for visual changes. ENT: Positive for sore throat. Cardiovascular: Negative for chest pain. Respiratory: Negative for shortness of breath. Gastrointestinal: Negative for abdominal pain, vomiting and diarrhea. Genitourinary: Negative for dysuria. Musculoskeletal: Negative for back pain. Skin: Negative for rash. Neurological: Negative for headaches, focal weakness or numbness. ____________________________________________  PHYSICAL EXAM:  VITAL SIGNS: ED Triage Vitals  Enc Vitals Group     BP 06/14/18 1522 136/87      Pulse Rate 06/14/18 1522 82     Resp 06/14/18 1522 18     Temp 06/14/18 1522 98.7 F (37.1 C)     Temp Source 06/14/18 1522 Oral     SpO2 06/14/18 1522 97 %     Weight 06/14/18 1520 295 lb (133.8 kg)     Height 06/14/18 1520 5\' 4"  (1.626 m)     Head Circumference --      Peak Flow --      Pain Score 06/14/18 1521 7     Pain Loc --      Pain Edu? --      Excl. in GC? --     Constitutional: Alert and oriented. Well appearing and in no distress. Head: Normocephalic and atraumatic. Eyes: Conjunctivae are normal. Normal extraocular movements Ears: Canals clear. TMs intact bilaterally.  Serous effusions noted bilaterally. Nose: No congestion/rhinorrhea/epistaxis. Mouth/Throat: Mucous membranes are moist.  Uvula is midline and tonsils are flat.  Oropharynx is injected without any frank tonsillar edema or exudate. Neck: Supple. No thyromegaly. Hematological/Lymphatic/Immunological: No cervical lymphadenopathy. Cardiovascular: Normal rate, regular rhythm. Normal distal pulses. Respiratory: Normal respiratory effort. No wheezes/rales/rhonchi. ____________________________________________   LABS (pertinent positives/negatives) Labs Reviewed  GROUP A STREP BY PCR  ____________________________________________  PROCEDURES  Procedures ____________________________________________  INITIAL IMPRESSION / ASSESSMENT AND PLAN / ED COURSE  Patient with ED evaluation of a 2-day complaint of sore throat and fevers.  Patient clinical picture is concerning for strep pharyngitis secondary to her recent sick contact as well as her significant pharyngitis and fevers.  Despite her negative strep PCR, she was treated for strep pharyngitis with a azithromycin.  A work note is provided for 1 additional day.  She follow-up primary provider or return to the ED as needed. ____________________________________________  FINAL CLINICAL IMPRESSION(S) / ED DIAGNOSES  Final diagnoses:  Strep pharyngitis   Non-recurrent acute serous otitis media of both ears      Karmen Stabs, Charlesetta Ivory, PA-C 06/14/18 1626    Arnaldo Natal, MD 06/14/18 [phone]

## 2018-06-14 NOTE — Discharge Instructions (Signed)
You are being treated for strep throat, despite your negative rapid test. Take the antibiotic as directed. Change your toothbrush after 24-hours on the antibiotic. Follow-up with your provider or return as needed.

## 2018-06-18 ENCOUNTER — Emergency Department
Admission: EM | Admit: 2018-06-18 | Discharge: 2018-06-18 | Disposition: A | Discharge status: Home / Self Care | Payer: Self-pay | Attending: Emergency Medicine | Admitting: Emergency Medicine

## 2018-06-18 ENCOUNTER — Other Ambulatory Visit: Payer: Self-pay

## 2018-06-18 ENCOUNTER — Encounter: Payer: Self-pay | Admitting: Emergency Medicine

## 2018-06-18 DIAGNOSIS — Z91040 Latex allergy status: Secondary | ICD-10-CM | POA: Insufficient documentation

## 2018-06-18 DIAGNOSIS — F419 Anxiety disorder, unspecified: Secondary | ICD-10-CM | POA: Insufficient documentation

## 2018-06-18 DIAGNOSIS — Z79899 Other long term (current) drug therapy: Secondary | ICD-10-CM | POA: Insufficient documentation

## 2018-06-18 DIAGNOSIS — F329 Major depressive disorder, single episode, unspecified: Secondary | ICD-10-CM | POA: Insufficient documentation

## 2018-06-18 DIAGNOSIS — J069 Acute upper respiratory infection, unspecified: Secondary | ICD-10-CM | POA: Insufficient documentation

## 2018-06-18 NOTE — ED Provider Notes (Signed)
[facility] Emergency Department Provider Note  ____________________________________________  Time seen: Approximately 7:03 PM  I have reviewed the triage vital signs and the nursing notes.   HISTORY  Chief Complaint Sore Throat; Cough; Nasal Congestion; and Otalgia   HPI Sara Boyd is a 28 y.o. female who presents to the emergency department for treatment and evaluation of bilateral ear pain and increased congestion. She was seen here 3 days ago for sore throat and given a prescription for a z pack that she did not fill.   Symptoms have worsened over the past couple of days.   Past Medical History:  Diagnosis Date  . Anxiety   . Chicken pox   . Depression   . Endometritis   . GERD (gastroesophageal reflux disease)   . Headache   . Medical history non-contributory   . Migraine   . OCD (obsessive compulsive disorder)   . Retained products of conception following abortion   . UTI (urinary tract infection)     Patient Active Problem List   Diagnosis Date Noted  . Episode of moderate major depression (HCC) 05/09/2018  . Anxiety and depression 05/04/2018  . Panic disorder 05/04/2018  . Obsessive-compulsive disorder 05/04/2018  . Chronic UTI 08/08/2017  . Chest pain 08/08/2017  . Anxiety 08/08/2017  . Myalgia 08/08/2017  . Gastroesophageal reflux disease 08/08/2017  . Class 3 severe obesity due to excess calories with body mass index (BMI) of 50.0 to 59.9 in adult (HCC) 08/08/2017  . Palpitations 08/08/2017  . Fatigue 08/08/2017  . Vitamin D deficiency 08/07/2017    Past Surgical History:  Procedure Laterality Date  . BLADDER SURGERY    . DILATION AND CURETTAGE OF UTERUS     elective AB  . DILATION AND EVACUATION N/A 10/29/2016   Procedure: DILATATION AND EVACUATION;  Surgeon: Nadara Mustard, MD;  Location: [facility] ORS;  Service: Gynecology;  Laterality: N/A;  . HERNIA REPAIR    . HERNIA REPAIR     2011  . TONSILLECTOMY      1998    Prior to Admission medications   Medication Sig Start Date End Date Taking? Authorizing Provider  ALPRAZolam (XANAX) 0.25 MG tablet Take 1 tablet (0.25 mg total) by mouth daily as needed for anxiety. 05/09/18   McLean-Scocuzza, Pasty Spillers, MD  azithromycin (ZITHROMAX Z-PAK) 250 MG tablet Take 2 tablets (500 mg) on  Day 1,  followed by 1 tablet (250 mg) once daily on Days 2 through 5. 06/14/18   Menshew, Charlesetta Ivory, PA-C  FLUoxetine (PROZAC) 20 MG capsule Take 1 capsule (20 mg total) by mouth daily. 05/09/18 08/07/18  Clapacs, Jackquline Denmark, MD  Vitamin D, Ergocalciferol, (DRISDOL) 50000 units CAPS capsule Take 1 capsule (50,000 Units total) by mouth 2 (two) times a week. 01/09/18   Shambley, Melody N, CNM    Allergies Pineapple; Sulfa antibiotics; Latex; Vicodin [hydrocodone-acetaminophen]; Codeine; and Penicillins  Family History  Problem Relation Age of Onset  . Cancer Mother        NHL  . Hyperlipidemia Mother   . Hypertension Mother   . COPD Father   . Heart disease Father   . Hypertension Father   . Hyperlipidemia Father   . Diabetes Maternal Grandmother   . Heart disease Maternal Grandfather   . Cancer Paternal Grandmother        breast  . Early death Paternal Grandfather   . Heart disease Paternal Grandfather     Social History Social History   Tobacco  Use  . Smoking status: Former Smoker  . Smokeless tobacco: Never Used  Substance Use Topics  . Alcohol use: No  . Drug use: No    Review of Systems Constitutional: Positive for fever/chills.  Decreased appetite. ENT: Positive for sore throat. Cardiovascular: Denies chest pain. Respiratory: Negative for shortness of breath.  Positive for cough.  Negative wheezing.  Gastrointestinal: Negative for nausea, no vomiting.  No diarrhea.  Musculoskeletal: Negative for body aches Skin: Negative for rash. Neurological: Positive for headaches ____________________________________________   PHYSICAL EXAM:  VITAL  SIGNS: ED Triage Vitals  Enc Vitals Group     BP 06/18/18 1751 123/72     Pulse Rate 06/18/18 1751 96     Resp 06/18/18 1751 18     Temp 06/18/18 1751 98.3 F (36.8 C)     Temp Source 06/18/18 1751 Oral     SpO2 06/18/18 1751 98 %     Weight 06/18/18 1752 295 lb (133.8 kg)     Height 06/18/18 1752 5\' 4"  (1.626 m)     Head Circumference --      Peak Flow --      Pain Score 06/18/18 1752 7     Pain Loc --      Pain Edu? --      Excl. in GC? --     Constitutional: Alert and oriented.  Well appearing and in no acute distress. Eyes: Conjunctivae are normal. Ears: Bilateral tympanic membranes are normal.  No erythema. Nose: Maxillary sinus congestion noted; no rhinnorhea. Mouth/Throat: Mucous membranes are moist.  Oropharynx erythematous. Tonsils 1+ without exudate. Uvula midline. Neck: No stridor.  Lymphatic: No cervical lymphadenopathy. Cardiovascular: Normal rate, regular rhythm. Good peripheral circulation. Respiratory: Respirations are even and unlabored.  No retractions.  Breath sounds clear to auscultation. Gastrointestinal: Soft and nontender.  Musculoskeletal: FROM x 4 extremities.  Neurologic:  Normal speech and language. Skin:  Skin is warm, dry and intact. No rash noted. Psychiatric: Mood and affect are normal. Speech and behavior are normal.  ____________________________________________   LABS (all labs ordered are listed, but only abnormal results are displayed)  Labs Reviewed - No data to display ____________________________________________  EKG  Not indicated ____________________________________________  RADIOLOGY  Not indicated ____________________________________________   PROCEDURES  Procedure(s) performed: None  Critical Care performed: No ____________________________________________   INITIAL IMPRESSION / ASSESSMENT AND PLAN / ED COURSE  28 y.o. female who presents to the emergency department for treatment and evaluation of sore throat,  congestion, earache, and headache.  She did have exposure to strep.  She was taking Keflex, but does not seem like it is improved her symptoms.  She was advised to get the Z-Pak filled and take Sudafed for the congestion.  She was advised to follow-up with the primary care provider of her choice for symptoms that are not improving over the next few days.  She will return to the emergency department for symptoms of change or worsen if she is unable to schedule appointment.    Medications - No data to display  ED Discharge Orders    None       Pertinent labs & imaging results that were available during my care of the patient were reviewed by me and considered in my medical decision making (see chart for details).    If controlled substance prescribed during this visit, 12 month history viewed on the [facility] prior to issuing an initial prescription for Schedule II or III opiod. ____________________________________________   FINAL CLINICAL IMPRESSION(S) /  ED DIAGNOSES  Final diagnoses:  Upper respiratory tract infection, unspecified type    Note:  This document was prepared using Dragon voice recognition software and may include unintentional dictation errors.     Chinita Pesterriplett, Mckenzie Toruno B, FNP 06/18/18 2233    Nita SickleVeronese, Breese, MD 06/19/18 [phone]

## 2018-06-18 NOTE — Discharge Instructions (Signed)
Take Sudafed as directed on the package to help with congestion. Take the azithromycin as prescribed when you were here before. Return to the ER for symptoms that change or worsen if unable to schedule an appointment with primary care.

## 2018-06-18 NOTE — ED Triage Notes (Signed)
Pt here with c/o sore throat, seen here for the same on Thursday, was written for Zpack but had Keflex at home, doesn't have insurance so she took the Keflex instead, having bilateral ear pain now with increased congestion. Has not completed Keflex yet. NAD.

## 2018-08-30 ENCOUNTER — Telehealth: Payer: Self-pay

## 2018-08-30 NOTE — Telephone Encounter (Signed)
Coronavirus (COVID-19) Are you at risk?  Are you at risk for the Coronavirus (COVID-19)?  To be considered HIGH RISK for Coronavirus (COVID-19), you have to meet the following criteria:  . Traveled to [place], [place], [place], [place] or [place]; or in the [place] to [place], [place], [place], or [place]; and have fever, cough, and shortness of breath within the last 2 weeks of travel OR . Been in close contact with a person diagnosed with COVID-19 within the last 2 weeks and have fever, cough, and shortness of breath . IF YOU DO NOT MEET THESE CRITERIA, YOU ARE CONSIDERED LOW RISK FOR COVID-19.  What to do if you are HIGH RISK for COVID-19?  . If you are having a medical emergency, call 911. . Seek medical care right away. Before you go to a doctor's office, urgent care or emergency department, call ahead and tell them about your recent travel, contact with someone diagnosed with COVID-19, and your symptoms. You should receive instructions from your physician's office regarding next steps of care.  . When you arrive at healthcare provider, tell the healthcare staff immediately you have returned from visiting [place], [place], [place], [place] or [place]; or traveled in the [place] to [place], [place], [place], or [place]; in the last two weeks or you have been in close contact with a person diagnosed with COVID-19 in the last 2 weeks.   . Tell the health care staff about your symptoms: fever, cough and shortness of breath. . After you have been seen by a medical provider, you will be either: o Tested for (COVID-19) and discharged home on quarantine except to seek medical care if symptoms worsen, and asked to  - Stay home and avoid contact with others until you get your results (4-5 days)  - Avoid travel on public transportation if possible (such as bus, train, or airplane) or o Sent to the Emergency Department by EMS for evaluation, COVID-19 testing, and possible  admission depending on your condition and test results.  What to do if you are LOW RISK for COVID-19?  Reduce your risk of any infection by using the same precautions used for avoiding the common cold or flu:  . Wash your hands often with soap and warm water for at least 20 seconds.  If soap and water are not readily available, use an alcohol-based hand sanitizer with at least 60% alcohol.  . If coughing or sneezing, cover your mouth and nose by coughing or sneezing into the elbow areas of your shirt or coat, into a tissue or into your sleeve (not your hands). . Avoid shaking hands with others and consider head nods or verbal greetings only. . Avoid touching your eyes, nose, or mouth with unwashed hands.  . Avoid close contact with people who are Sara Boyd. . Avoid places or events with large numbers of people in one location, like concerts or sporting events. . Carefully consider travel plans you have or are making. . If you are planning any travel outside or inside the [place], visit the CDC's Travelers' Health webpage for the latest health notices. . If you have some symptoms but not all symptoms, continue to monitor at home and seek medical attention if your symptoms worsen. . If you are having a medical emergency, call 911.  08/30/18 SCREENING NEG SLS ADDITIONAL HEALTHCARE OPTIONS FOR PATIENTS  [facility] / e-Visit: [email or web address]         [facility]: [phone]    [facility]: [phone]                   [facility]: [phone]  

## 2018-08-31 ENCOUNTER — Ambulatory Visit (INDEPENDENT_AMBULATORY_CARE_PROVIDER_SITE_OTHER): Payer: Medicaid Other | Admitting: Obstetrics and Gynecology

## 2018-08-31 ENCOUNTER — Other Ambulatory Visit: Payer: Self-pay

## 2018-08-31 ENCOUNTER — Encounter: Payer: Self-pay | Admitting: Obstetrics and Gynecology

## 2018-08-31 VITALS — BP 95/81 | HR 81 | Ht 64.0 in | Wt 298.7 lb

## 2018-08-31 DIAGNOSIS — N926 Irregular menstruation, unspecified: Secondary | ICD-10-CM

## 2018-08-31 DIAGNOSIS — E559 Vitamin D deficiency, unspecified: Secondary | ICD-10-CM

## 2018-08-31 DIAGNOSIS — Z6841 Body Mass Index (BMI) 40.0 and over, adult: Secondary | ICD-10-CM

## 2018-08-31 DIAGNOSIS — R102 Pelvic and perineal pain: Secondary | ICD-10-CM | POA: Diagnosis not present

## 2018-08-31 LAB — POCT URINE PREGNANCY: Preg Test, Ur: POSITIVE — AB

## 2018-08-31 MED ORDER — VITAMIN D (ERGOCALCIFEROL) 1.25 MG (50000 UNIT) PO CAPS: 50000.0000 [IU] | ORAL_CAPSULE | ORAL | 2 refills | Status: DC

## 2018-08-31 NOTE — Progress Notes (Signed)
  Subjective:     Patient ID: Sara Boyd, female   DOB: Jan 24, 1991, 28 y.o.   MRN: [id number]  HPI Here for pregnancy confirmation, reports normal LMP 07/21/2018 that was light for 4 days, giving EDC 04/27/2019 and EGA [redacted]w[redacted]d. Does report right ovarian pain since March. Previous pregnancies- 37 weeks delivery, 39 week delivery (son with CP), therapeutic AB with endometritis and followed by [facility]. Stopped xanax and prozac a month ago.  Review of Systems  Genitourinary: Positive for pelvic pain.  All other systems reviewed and are negative.      Objective:   Physical Exam A&Ox4 Well groomed obese female in no distress Blood pressure 95/81, pulse 81, height 5\' 4"  (1.626 m), weight 298 lb 11.2 oz (135.5 kg), last menstrual period 07/21/2018, unknown if currently breastfeeding. Body mass index is 51.27 kg/m.  UPT+    Assessment:     Missed menses Vit D def. BMI 51     Plan:     Congratulated on pregnancy, will do viability scan in 1 week due to h/o endometritis and morbid obesity. Labs obtained today-will follow up accordingly To restart vit d 50,000 IU 2 times a week. New OB PE with Me in 6 weeks.   Reesha Debes,CNM

## 2018-08-31 NOTE — Patient Instructions (Addendum)
FREQUENTLY ASKED QUESTIONS FOR OBSTETRICS/PEDIATRICS    Q: Why are visitor restrictions different for maternity care areas?  [place] is restricting visitors for the duration of the patient's hospitalization. The birth of a child involves the mother, considered the patient, and a birthing partner. These are unprecedented times and we are making the exception to allow a birthing partner to be a part of the patient unit. No other guests will be allowed in our [place] at [facility] and at [facility].   Q: Are credentialed doulas allowed to support their existing patients?  We acknowledge the value these doula partnerships offer our care teams and many birthing families in our communities. Each laboring mother is allowed one birthing partner of the patient's choosing for her entire hospitalization.   Q: Are visitor restrictions different for hospitalized children?  Pediatric patients (infants and children under 50 years of age), such as those in the Children's Unit, Pediatric ICU and NICU, will be allowed two visitors (parents or legal guardians)   Q: Are pregnant women at an increased risk for COVID-19?  The [employer] of Obstetricians and Gynecologists (ACOG) is monitoring closely the coronavirus pandemic. With the limited information available, data does not indicate pregnant women are at an increased risk. However, pregnant women are known to be at greater risk for respiratory infections like flu. With that in mind, expectant mothers are considered an at-risk population for COVID-19, according to ACOG.   Q: Are newborns at an increased risk for COVID-19?  A limited sample of COVID-19 data with newborns indicates the virus is not transferred to the infant during pregnancy. However, postpartum separation is recommended by the Centers for  Disease Control (CDC). As a result [place] recommends and strongly encourages temporary separation of moms and babies who test positive for COVID-19 or are awaiting results to rule out COVID-19 based on CDC guidelines.   Q: If you have a suspected case of COVID-19, is the NICU couplet care room an option?  No. If either patient is considered at-risk for having COVID-19, the [place] at [facility] will not use the NICU couplet care rooms for that family.   Q: [place] is urging that elective procedures be postponed. What is considered elective for women's and children's service line?  NOT ELECTIVE: Obstetric procedures, even those with an element of choice on timing, are not considered elective. Circumcisions are considered elective procedures, however, these do not deplete blood products and other resources, which is the spirit in which the COVID-19 postponement of elective procedures was intended. Therefore, circumcisions will be allowed.   ELECTIVE: Postpartum tubal ligations are considered elective and should be postponed. Q&A for Obstetricians, Gynecologists and Pediatricians  Published July 28, 2018   [facility] supports as much as possible the medical care  care team working with the patient's individual needs to address timing during these unprecedented times. We seek the support of our medical care team in preserving needed resources throughout our crisis response to COVID-19.   Q: How does COVID-19 impact breastfeeding?  Breastmilk is safe for your baby - even if the mother has tested positive for COVID-19. If a COVID-19+ mother decides to breastfeed while inpatient and after discharge, we suggest proper protective equipment be worn and hand hygiene be performed before and after feeding the infant. The new mother also has the option to pump her milk and have a healthy family member feed the baby to protect the baby from getting the virus.   Q: Should we urge  patients to avoid baby showers and large gatherings?  Yes. As has been recommended for all citizens in our communities, gatherings of 10 or more should be avoided - pregnant or not. Seek creative options for "hosting" baby showers through electronic means that honor the request for social distancing during this time of heightened awareness.   Q: Should patients miss their prenatal appointments?  No. Prenatal visits are NOT elective. While we want to limit contact and exposure, prenatal care is vital right now. Contact your physician's office if you have concerns about your visits. We are limiting outpatient office visits to the patient and one guest in order to reduce the potential for exposure.   Q: What if a pregnant woman feels sick? Should she miss her prenatal visit then?  A pregnant woman experiencing coronavirus-like symptoms (i.e., cough, fever, difficulty breathing, shortness of breath, gastrointestinal issues) should contact her pregnancy care provider by phone. Her medical professional can best determine whether she should use a video visit or possibly go to a collection site to be tested for COVID-19. Contacting her primary care provider or her pregnancy care provider is her first step.   Q: What can I do about childbirth education? All the classes are cancelled.  The [facility] [place] will offer online learning to support mothers on their journey. We currently offer Understanding Childbirth, Understanding Breastfeeding and Understanding Newborn Care as an online class. Please visit our website, [email or web address], to register for an online class.   Q: How can I keep from getting COVID-19? Q&A for Obstetricians, Gynecologists and Pediatricians  Published July 28, 2018   Together, we can reduce the risk of exposure to the virus and help you and your family remain healthy and safe. One of the best ways to protect yourself is to wash your hands frequently using soap and  water. Also, you should avoid touching your eyes, nose and mouth with unwashed hands, avoid physical contact with others and practice social distancing.   Q: How are employees being informed about what to do?  [place] leaders receive a daily COVID-19 update and share relevant information with their teams. This is a time when health care professionals are called on to lead within our community. We appreciate our staff's engagement with our COVID-19 updates and encourage them to share best practices on reducing the spread of the virus with our patients and community. We are prepared to provide the exceptional COVID-19 care and coordination our community needs, expects and deserves.   Q: Who's in charge of this issue at [facility]?  The leadership structure and process established to address COVID-19 includes Chief Physician Executive Phoebe Sharps, MD; Infection Prevention Medical Director Carlyle Basques, MD; and Infection Prevention Interim Director Hubert Azure, MSN, RN, CIC, CSPDT. A  of [place] experts reflecting a broad spectrum of our workforce is meeting daily to evaluate new information we receive about MEQAS-34 and to adapt policies and practices accordingly.                         Published July 28, 2018    First Trimester of Pregnancy The first trimester of pregnancy is from week 1 until the end of week 13 (months 1 through 3). A week after a sperm fertilizes an egg, the egg will implant on the wall of the uterus. This embryo will begin to develop into a baby. Genes from you and your partner will form the baby. The female genes will determine whether the baby will be a boy or a girl. At 6-8 weeks, the eyes and face will be formed, and the heartbeat can be seen on ultrasound. At the end of 12 weeks, all the baby's organs will be formed. Now that you are pregnant, you will want to do everything you can to have a healthy baby. Two of the most important things are to get good prenatal  care and to follow your health care provider's instructions. Prenatal care is all the medical care you receive before the baby's birth. This care will help prevent, find, and treat any problems during the pregnancy and childbirth. Body changes during your first trimester Your body goes through many changes during pregnancy. The changes vary from woman to woman.  You may gain or lose a couple of pounds at first.  You may feel sick to your stomach (nauseous) and you may throw up (vomit). If the vomiting is uncontrollable, call your health care provider.  You may tire easily.  You may develop headaches that can be relieved by medicines. All medicines should be approved by your health care provider.  You may urinate more often. Painful urination may mean you have a bladder infection.  You may develop heartburn as a result of your pregnancy.  You may develop constipation because certain hormones are causing the muscles that push stool through your intestines to slow down.  You may develop hemorrhoids or swollen veins (varicose veins).  Your breasts may begin to grow larger and become tender. Your nipples may stick out more, and the tissue that surrounds them (areola) may become darker.  Your gums may bleed and may be sensitive to brushing and flossing.  Dark spots or blotches (chloasma, mask of pregnancy) may develop on your face. This will likely fade after the baby is born.  Your menstrual periods will stop.  You may have a loss of appetite.  You may develop cravings for certain kinds of food.  You may have changes in your emotions from day to day, such as being excited to be pregnant or being concerned that something may go wrong with the pregnancy and baby.  You may have more vivid and strange dreams.  You may have changes in your hair. These can include thickening of your hair, rapid growth, and changes in texture. Some women also have hair loss during or after pregnancy, or hair  that feels dry or thin. Your hair will most likely return to normal after your baby is born. What to expect at prenatal visits During a routine prenatal visit:  You will be weighed to make sure you and the baby are growing normally.  Your blood pressure will be taken.  Your abdomen will be measured to track your baby's growth.  The  fetal heartbeat will be listened to between weeks 10 and 14 of your pregnancy.  Test results from any previous visits will be discussed. Your health care provider may ask you:  How you are feeling.  If you are feeling the baby move.  If you have had any abnormal symptoms, such as leaking fluid, bleeding, severe headaches, or abdominal cramping.  If you are using any tobacco products, including cigarettes, chewing tobacco, and electronic cigarettes.  If you have any questions. Other tests that may be performed during your first trimester include:  Blood tests to find your blood type and to check for the presence of any previous infections. The tests will also be used to check for low iron levels (anemia) and protein on red blood cells (Rh antibodies). Depending on your risk factors, or if you previously had diabetes during pregnancy, you may have tests to check for high blood sugar that affects pregnant women (gestational diabetes).  Urine tests to check for infections, diabetes, or protein in the urine.  An ultrasound to confirm the proper growth and development of the baby.  Fetal screens for spinal cord problems (spina bifida) and Down syndrome.  HIV (human immunodeficiency virus) testing. Routine prenatal testing includes screening for HIV, unless you choose not to have this test.  You may need other tests to make sure you and the baby are doing well. Follow these instructions at home: Medicines  Follow your health care provider's instructions regarding medicine use. Specific medicines may be either safe or unsafe to take during  pregnancy.  Take a prenatal vitamin that contains at least 600 micrograms (mcg) of folic acid.  If you develop constipation, try taking a stool softener if your health care provider approves. Eating and drinking   Eat a balanced diet that includes fresh fruits and vegetables, whole grains, good sources of protein such as meat, eggs, or tofu, and low-fat dairy. Your health care provider will help you determine the amount of weight gain that is right for you.  Avoid raw meat and uncooked cheese. These carry germs that can cause birth defects in the baby.  Eating four or five small meals rather than three large meals a day may help relieve nausea and vomiting. If you start to feel nauseous, eating a few soda crackers can be helpful. Drinking liquids between meals, instead of during meals, also seems to help ease nausea and vomiting.  Limit foods that are high in fat and processed sugars, such as fried and sweet foods.  To prevent constipation: ? Eat foods that are high in fiber, such as fresh fruits and vegetables, whole grains, and beans. ? Drink enough fluid to keep your urine clear or pale yellow. Activity  Exercise only as directed by your health care provider. Most women can continue their usual exercise routine during pregnancy. Try to exercise for 30 minutes at least 5 days a week. Exercising will help you: ? Control your weight. ? Stay in shape. ? Be prepared for labor and delivery.  Experiencing pain or cramping in the lower abdomen or lower back is a good sign that you should stop exercising. Check with your health care provider before continuing with normal exercises.  Try to avoid standing for long periods of time. Move your legs often if you must stand in one place for a long time.  Avoid heavy lifting.  Wear low-heeled shoes and practice good posture.  You may continue to have sex unless your health care provider tells you not  to. Relieving pain and discomfort  Wear a  good support bra to relieve breast tenderness.  Take warm sitz baths to soothe any pain or discomfort caused by hemorrhoids. Use hemorrhoid cream if your health care provider approves.  Rest with your legs elevated if you have leg cramps or low back pain.  If you develop varicose veins in your legs, wear support hose. Elevate your feet for 15 minutes, 3-4 times a day. Limit salt in your diet. Prenatal care  Schedule your prenatal visits by the twelfth week of pregnancy. They are usually scheduled monthly at first, then more often in the last 2 months before delivery.  Write down your questions. Take them to your prenatal visits.  Keep all your prenatal visits as told by your health care provider. This is important. Safety  Wear your seat belt at all times when driving.  Make a list of emergency phone numbers, including numbers for family, friends, the hospital, and police and fire departments. General instructions  Ask your health care provider for a referral to a local prenatal education class. Begin classes no later than the beginning of month 6 of your pregnancy.  Ask for help if you have counseling or nutritional needs during pregnancy. Your health care provider can offer advice or refer you to specialists for help with various needs.  Do not use hot tubs, steam rooms, or saunas.  Do not douche or use tampons or scented sanitary pads.  Do not cross your legs for long periods of time.  Avoid cat litter boxes and soil used by cats. These carry germs that can cause birth defects in the baby and possibly loss of the fetus by miscarriage or stillbirth.  Avoid all smoking, herbs, alcohol, and medicines not prescribed by your health care provider. Chemicals in these products affect the formation and growth of the baby.  Do not use any products that contain nicotine or tobacco, such as cigarettes and e-cigarettes. If you need help quitting, ask your health care provider. You may  receive counseling support and other resources to help you quit.  Schedule a dentist appointment. At home, brush your teeth with a soft toothbrush and be gentle when you floss. Contact a health care provider if:  You have dizziness.  You have mild pelvic cramps, pelvic pressure, or nagging pain in the abdominal area.  You have persistent nausea, vomiting, or diarrhea.  You have a bad smelling vaginal discharge.  You have pain when you urinate.  You notice increased swelling in your face, hands, legs, or ankles.  You are exposed to fifth disease or chickenpox.  You are exposed to [place] measles (rubella) and have never had it. Get help right away if:  You have a fever.  You are leaking fluid from your vagina.  You have spotting or bleeding from your vagina.  You have severe abdominal cramping or pain.  You have rapid weight gain or loss.  You vomit blood or material that looks like coffee grounds.  You develop a severe headache.  You have shortness of breath.  You have any kind of trauma, such as from a fall or a car accident. Summary  The first trimester of pregnancy is from week 1 until the end of week 13 (months 1 through 3).  Your body goes through many changes during pregnancy. The changes vary from woman to woman.  You will have routine prenatal visits. During those visits, your health care provider will examine you, discuss any test results  you may have, and talk with you about how you are feeling. This information is not intended to replace advice given to you by your health care provider. Make sure you discuss any questions you have with your health care provider. Document Released: 04/20/2001 Document Revised: 04/07/2016 Document Reviewed: 04/07/2016 Elsevier Interactive Patient Education  2019 [employer]. Commonly Asked Questions During Pregnancy  Cats: A parasite can be excreted in cat feces.  To avoid exposure you need to have another person empty the  little box.  If you must empty the litter box you will need to wear gloves.  Wash your hands after handling your cat.  This parasite can also be found in raw or undercooked meat so this should also be avoided.  Colds, Sore Throats, Flu: Please check your medication sheet to see what you can take for symptoms.  If your symptoms are unrelieved by these medications please call the office.  Dental Work: Most any dental work Investment banker[employer] corporate recommends is permitted.  X-rays should only be taken during the first trimester if absolutely necessary.  Your abdomen should be shielded with a lead apron during all x-rays.  Please notify your provider prior to receiving any x-rays.  Novocaine is fine; gas is not recommended.  If your dentist requires a note from [place] prior to dental work please call the office and we will provide one for you.  Exercise: Exercise is an important part of staying healthy during your pregnancy.  You may continue most exercises you were accustomed to prior to pregnancy.  Later in your pregnancy you will most likely notice you have difficulty with activities requiring balance like riding a bicycle.  It is important that you listen to your body and avoid activities that put you at a higher risk of falling.  Adequate rest and staying well hydrated are a must!  If you have questions about the safety of specific activities ask your provider.    Exposure to Children with illness: Try to avoid obvious exposure; report any symptoms to [place] when noted,  If you have chicken pos, red measles or mumps, you should be immune to these diseases.   Please do not take any vaccines while pregnant unless you have checked with your OB provider.  Fetal Movement: After 28 weeks we recommend you do "kick counts" twice daily.  Lie or sit down in a calm quiet environment and count your baby movements "kicks".  You should feel your baby at least 10 times per hour.  If you have not felt 10 kicks within the first hour get up,  walk around and have something sweet to eat or drink then repeat for an additional hour.  If count remains less than 10 per hour notify your provider.  Fumigating: Follow your pest control agent's advice as to how long to stay out of your home.  Ventilate the area well before re-entering.  Hemorrhoids:   Most over-the-counter preparations can be used during pregnancy.  Check your medication to see what is safe to use.  It is important to use a stool softener or fiber in your diet and to drink lots of liquids.  If hemorrhoids seem to be getting worse please call the office.   Hot Tubs:  Hot tubs Jacuzzis and saunas are not recommended while pregnant.  These increase your internal body temperature and should be avoided.  Intercourse:  Sexual intercourse is safe during pregnancy as long as you are comfortable, unless otherwise advised by your provider.  Spotting may occur after intercourse; report any bright red bleeding that is heavier than spotting.  Labor:  If you know that you are in labor, please go to the hospital.  If you are unsure, please call the office and let us help you decide what to do.  Lifting, straining, etc:  If your job requires heavy lifting or straining please check with your provider for any limitations.  Generally, you should not lift items heavier than that you can lift simply with your hands and arms (no back muscles)  Painting:  Paint fumes do not harm your pregnancy, but may make you ill and should be avoided if possible.  Latex or water based paints have less odor than oils.  Use adequate ventilation while painting.  Permanents & Hair Color:  Chemicals in hair dyes are not recommended as they cause increase hair dryness which can increase hair loss during pregnancy.  " Highlighting" and permanents are allowed.  Dye may be absorbed differently and permanents may not hold as well during pregnancy.  Sunbathing:  Use a sunscreen, as skin burns easily during pregnancy.  Drink  plenty of fluids; avoid over heating.  Tanning Beds:  Because their possible side effects are still unknown, tanning beds are not recommended.  Ultrasound Scans:  Routine ultrasounds are performed at approximately 20 weeks.  You will be able to see your baby's general anatomy an if you would like to know the gender this can usually be determined as well.  If it is questionable when you conceived you may also receive an ultrasound early in your pregnancy for dating purposes.  Otherwise ultrasound exams are not routinely performed unless there is a medical necessity.  Although you can request a scan we ask that you pay for it when conducted because insurance does not cover " patient request" scans.  Work: If your pregnancy proceeds without complications you may work until your due date, unless your physician or employer advises otherwise.  Round Ligament Pain/Pelvic Discomfort:  Sharp, shooting pains not associated with bleeding are fairly common, usually occurring in the second trimester of pregnancy.  They tend to be worse when standing up or when you remain standing for long periods of time.  These are the result of pressure of certain pelvic ligaments called "round ligaments".  Rest, Tylenol and heat seem to be the most effective relief.  As the womb and fetus grow, they rise out of the pelvis and the discomfort improves.  Please notify the office if your pain seems different than that described.  It may represent a more serious condition.  Common Medications Safe in Pregnancy  Acne:      Constipation:  Benzoyl Peroxide     Colace  Clindamycin      Dulcolax Suppository  Topica Erythromycin     Fibercon  Salicylic Acid      Metamucil         Miralax AVOID:        Senakot   Accutane    Cough:  Retin-A       Cough Drops  Tetracycline      Phenergan w/ Codeine if Rx  Minocycline      Robitussin (Plain &  DM)  Antibiotics:     Crabs/Lice:  Ceclor       RID  Cephalosporins    AVOID:  E-Mycins      Kwell  Keflex  Macrobid/Macrodantin   Diarrhea:  Penicillin      Kao-Pectate  Zithromax  Imodium AD         PUSH FLUIDS AVOID:       Cipro     Fever:  Tetracycline      Tylenol (Regular or Extra  Minocycline       Strength)  Levaquin      Extra Strength-Do not          Exceed 8 tabs/24 hrs Caffeine:        <229m/day (equiv. To 1 cup of coffee or  approx. 3 12 oz sodas)         Gas: Cold/Hayfever:       Gas-X  Benadryl      Mylicon  Claritin       Phazyme  **Claritin-D        Chlor-Trimeton    Headaches:  Dimetapp      ASA-Free Excedrin  Drixoral-Non-Drowsy     Cold Compress  Mucinex (Guaifenasin)     Tylenol (Regular or Extra  Sudafed/Sudafed-12 Hour     Strength)  **Sudafed PE Pseudoephedrine   Tylenol Cold & Sinus     Vicks Vapor Rub  Zyrtec  **AVOID if Problems With Blood Pressure         Heartburn: Avoid lying down for at least 1 hour after meals  Aciphex      Maalox     Rash:  Milk of Magnesia     Benadryl    Mylanta       1% Hydrocortisone Cream  Pepcid  Pepcid Complete   Sleep Aids:  Prevacid      Ambien   Prilosec       Benadryl  Rolaids       Chamomile Tea  Tums (Limit 4/day)     Unisom  Zantac       Tylenol PM         Warm milk-add vanilla or  Hemorrhoids:       Sugar for taste  Anusol/Anusol H.C.  (RX: Analapram 2.5%)  Sugar Substitutes:  Hydrocortisone OTC     Ok in moderation  Preparation H      Tucks        Vaseline lotion applied to tissue with wiping    Herpes:     Throat:  Acyclovir      Oragel  Famvir  Valtrex     Vaccines:         Flu Shot Leg Cramps:       *Gardasil  Benadryl      Hepatitis A         Hepatitis B Nasal Spray:       Pneumovax  Saline Nasal Spray     Polio Booster         Tetanus Nausea:       Tuberculosis test or PPD  Vitamin B6 25 mg TID   AVOID:    Dramamine      *Gardasil  Emetrol       Live  Poliovirus  Ginger Root 250 mg QID    MMR (measles, mumps &  High Complex Carbs @ Bedtime    rebella)  Sea Bands-Accupressure    Varicella (Chickenpox)  Unisom 1/2 tab TID     *No known complications           If received before Pain:         Known pregnancy;   Darvocet       Resume series after  Lortab        Delivery  Percocet  Yeast:   Tramadol      Femstat  Tylenol 3      Gyne-lotrimin  Ultram       Monistat  Vicodin           MISC:         All Sunscreens           Hair Coloring/highlights          Insect Repellant's          (Including DEET)         Mystic Tans

## 2018-09-01 LAB — BETA HCG QUANT (REF LAB): hCG Quant: 14270 m[IU]/mL

## 2018-09-01 LAB — VITAMIN D 25 HYDROXY (VIT D DEFICIENCY, FRACTURES): Vit D, 25-Hydroxy: 13.3 ng/mL — ABNORMAL LOW (ref 30.0–100.0)

## 2018-09-05 ENCOUNTER — Other Ambulatory Visit: Payer: Self-pay | Admitting: Obstetrics and Gynecology

## 2018-09-05 ENCOUNTER — Telehealth: Payer: Self-pay

## 2018-09-05 NOTE — Telephone Encounter (Signed)
Coronavirus (COVID-19) Are you at risk?  Are you at risk for the Coronavirus (COVID-19)?  To be considered HIGH RISK for Coronavirus (COVID-19), you have to meet the following criteria:  . Traveled to [place], [place], [place], [place] or [place]; or in the [place] to [place], [place], [place], or [place]; and have fever, cough, and shortness of breath within the last 2 weeks of travel OR . Been in close contact with a person diagnosed with COVID-19 within the last 2 weeks and have fever, cough, and shortness of breath . IF YOU DO NOT MEET THESE CRITERIA, YOU ARE CONSIDERED LOW RISK FOR COVID-19.  What to do if you are HIGH RISK for COVID-19?  . If you are having a medical emergency, call 911. . Seek medical care right away. Before you go to a doctor's office, urgent care or emergency department, call ahead and tell them about your recent travel, contact with someone diagnosed with COVID-19, and your symptoms. You should receive instructions from your physician's office regarding next steps of care.  . When you arrive at healthcare provider, tell the healthcare staff immediately you have returned from visiting [place], [place], [place], [place] or [place]; or traveled in the [place] to [place], [place], [place], or [place]; in the last two weeks or you have been in close contact with a person diagnosed with COVID-19 in the last 2 weeks.   . Tell the health care staff about your symptoms: fever, cough and shortness of breath. . After you have been seen by a medical provider, you will be either: o Tested for (COVID-19) and discharged home on quarantine except to seek medical care if symptoms worsen, and asked to  - Stay home and avoid contact with others until you get your results (4-5 days)  - Avoid travel on public transportation if possible (such as bus, train, or airplane) or o Sent to the Emergency Department by EMS for evaluation, COVID-19 testing, and possible  admission depending on your condition and test results.  What to do if you are LOW RISK for COVID-19?  Reduce your risk of any infection by using the same precautions used for avoiding the common cold or flu:  . Wash your hands often with soap and warm water for at least 20 seconds.  If soap and water are not readily available, use an alcohol-based hand sanitizer with at least 60% alcohol.  . If coughing or sneezing, cover your mouth and nose by coughing or sneezing into the elbow areas of your shirt or coat, into a tissue or into your sleeve (not your hands). . Avoid shaking hands with others and consider head nods or verbal greetings only. . Avoid touching your eyes, nose, or mouth with unwashed hands.  . Avoid close contact with people who are sick. . Avoid places or events with large numbers of people in one location, like concerts or sporting events. . Carefully consider travel plans you have or are making. . If you are planning any travel outside or inside the [place], visit the CDC's Travelers' Health webpage for the latest health notices. . If you have some symptoms but not all symptoms, continue to monitor at home and seek medical attention if your symptoms worsen. . If you are having a medical emergency, call 911.   ADDITIONAL HEALTHCARE OPTIONS FOR PATIENTS  [facility] / e-Visit: [email or web address]         [facility]: [phone]  [facility]: [phone]                   [facility]: [phone]   Prescreened. Neg .cm 

## 2018-09-06 ENCOUNTER — Other Ambulatory Visit: Payer: Self-pay

## 2018-09-06 ENCOUNTER — Ambulatory Visit: Payer: Medicaid Other | Admitting: Obstetrics and Gynecology

## 2018-09-06 ENCOUNTER — Ambulatory Visit (INDEPENDENT_AMBULATORY_CARE_PROVIDER_SITE_OTHER): Payer: Medicaid Other

## 2018-09-06 VITALS — BP 96/61 | HR 86 | Ht 64.0 in | Wt 296.1 lb

## 2018-09-06 DIAGNOSIS — Z3687 Encounter for antenatal screening for uncertain dates: Secondary | ICD-10-CM

## 2018-09-06 DIAGNOSIS — N926 Irregular menstruation, unspecified: Secondary | ICD-10-CM

## 2018-09-06 DIAGNOSIS — Z3481 Encounter for supervision of other normal pregnancy, first trimester: Secondary | ICD-10-CM

## 2018-09-06 NOTE — Progress Notes (Signed)
Sara Boyd presents for NOB nurse interview visit. Pregnancy confirmation done 08/31/18 MS.  G-4 .P2 0 1 2   . Pregnancy education material explained and given. 0 cats in the home. NOB labs ordered. (TSH/HbgA1c due to Increased BMI. HIV labs and Drug screen were explained optional and she did not decline. Drug screen ordered. PNV encouraged. Genetic screening options discussed. Genetic testing: Unsure.  Pt may discuss with provider. Pt. To follow up with provider in 5 weeks for NOB physical.  All questions answered.

## 2018-09-07 LAB — TSH: TSH: 1.05 u[IU]/mL (ref 0.450–4.500)

## 2018-09-07 LAB — MICROSCOPIC EXAMINATION

## 2018-09-07 LAB — HEMOGLOBIN A1C
Est. average glucose Bld gHb Est-mCnc: 105 mg/dL
Hgb A1c MFr Bld: 5.3 % (ref 4.8–5.6)

## 2018-09-07 LAB — ABO AND RH

## 2018-09-07 LAB — URINALYSIS, ROUTINE W REFLEX MICROSCOPIC
Bilirubin, UA: NEGATIVE
Glucose, UA: NEGATIVE
Ketones, UA: NEGATIVE
Nitrite, UA: NEGATIVE
RBC, UA: NEGATIVE
Specific Gravity, UA: 1.022 (ref 1.005–1.030)
Urobilinogen, Ur: 0.2 mg/dL (ref 0.2–1.0)
pH, UA: >=9 — AB (ref 5.0–7.5)

## 2018-09-07 LAB — VARICELLA ZOSTER ANTIBODY, IGG: Varicella zoster IgG: 702 index (ref >165)

## 2018-09-07 LAB — RPR: RPR Ser Ql: NONREACTIVE

## 2018-09-07 LAB — HEPATITIS B SURFACE ANTIGEN: Hepatitis B Surface Ag: NEGATIVE

## 2018-09-07 LAB — HGB SOLU + RFLX FRAC: Sickle Solubility Test - HGBRFX: NEGATIVE

## 2018-09-07 LAB — HIV ANTIBODY (ROUTINE TESTING W REFLEX): HIV Screen 4th Generation wRfx: NONREACTIVE

## 2018-09-07 LAB — ANTIBODY SCREEN: Antibody Screen: NEGATIVE

## 2018-09-07 LAB — RUBELLA SCREEN: Rubella Antibodies, IGG: 2.97 index (ref >0.99)

## 2018-09-08 ENCOUNTER — Telehealth: Payer: Self-pay | Admitting: Obstetrics and Gynecology

## 2018-09-08 ENCOUNTER — Other Ambulatory Visit: Payer: Self-pay

## 2018-09-08 LAB — MONITOR DRUG PROFILE 14(MW)

## 2018-09-08 LAB — NICOTINE SCREEN, URINE

## 2018-09-08 LAB — GC/CHLAMYDIA PROBE AMP
Chlamydia trachomatis, NAA: NEGATIVE
Neisseria Gonorrhoeae by PCR: NEGATIVE

## 2018-09-08 NOTE — Telephone Encounter (Signed)
The pt called and stated she had a urine done this past Wednesday, and is hoping to get a call back with the results and possible prescription before the weekend. The patient stated she is extremely uncomfortable. Please advise.

## 2018-09-08 NOTE — Telephone Encounter (Signed)
Please see another phone encounter.  

## 2018-09-08 NOTE — Telephone Encounter (Signed)
Pt called and went over the urine results that was available. Pt was informed that by the results that she did not have an UTI but I need to send her message to a provider and no one was here. Pt was advised that I would send AT a message to review her results.

## 2018-09-09 LAB — URINE CULTURE

## 2018-09-11 ENCOUNTER — Telehealth: Payer: Self-pay

## 2018-09-11 ENCOUNTER — Other Ambulatory Visit: Payer: Self-pay

## 2018-09-11 ENCOUNTER — Other Ambulatory Visit: Payer: Self-pay | Admitting: Certified Nurse Midwife

## 2018-09-11 DIAGNOSIS — N390 Urinary tract infection, site not specified: Secondary | ICD-10-CM

## 2018-09-11 MED ORDER — CEPHALEXIN 500 MG PO CAPS: 500.0000 mg | ORAL_CAPSULE | Freq: Two times a day (BID) | ORAL | 0 refills | Status: DC

## 2018-09-11 NOTE — Telephone Encounter (Signed)
A call was placed to pt regarding some confusion she had on her ultrasound. All of her questions were answered.

## 2018-09-11 NOTE — Telephone Encounter (Signed)
Sara Boyd, please contact patient and see if she has ever taken Keflex before without reaction? If drug is okay may have BID x 7 days, dispense 14, no refills for treatment of UTI. Also, she can have some pyridium as well. Thanks, JML

## 2018-09-18 ENCOUNTER — Other Ambulatory Visit: Payer: Self-pay | Admitting: *Deleted

## 2018-09-18 MED ORDER — ONDANSETRON 4 MG PO TBDP: 4.0000 mg | ORAL_TABLET | Freq: Three times a day (TID) | ORAL | 2 refills | Status: DC | PRN

## 2018-10-02 ENCOUNTER — Encounter: Payer: Self-pay | Admitting: Intensive Care

## 2018-10-02 ENCOUNTER — Other Ambulatory Visit: Payer: Self-pay

## 2018-10-02 ENCOUNTER — Emergency Department: Payer: Managed Care, Other (non HMO)

## 2018-10-02 ENCOUNTER — Emergency Department: Payer: Self-pay

## 2018-10-02 ENCOUNTER — Emergency Department
Admission: EM | Admit: 2018-10-02 | Discharge: 2018-10-02 | Disposition: A | Discharge status: Home / Self Care | Payer: Managed Care, Other (non HMO) | Attending: Emergency Medicine | Admitting: Emergency Medicine

## 2018-10-02 DIAGNOSIS — Z91040 Latex allergy status: Secondary | ICD-10-CM | POA: Diagnosis not present

## 2018-10-02 DIAGNOSIS — O418X10 Other specified disorders of amniotic fluid and membranes, first trimester, not applicable or unspecified: Secondary | ICD-10-CM | POA: Diagnosis not present

## 2018-10-02 DIAGNOSIS — O468X1 Other antepartum hemorrhage, first trimester: Secondary | ICD-10-CM | POA: Diagnosis not present

## 2018-10-02 DIAGNOSIS — Z79899 Other long term (current) drug therapy: Secondary | ICD-10-CM | POA: Insufficient documentation

## 2018-10-02 DIAGNOSIS — O209 Hemorrhage in early pregnancy, unspecified: Secondary | ICD-10-CM | POA: Diagnosis present

## 2018-10-02 DIAGNOSIS — Z3A10 10 weeks gestation of pregnancy: Secondary | ICD-10-CM | POA: Diagnosis not present

## 2018-10-02 DIAGNOSIS — Z87891 Personal history of nicotine dependence: Secondary | ICD-10-CM | POA: Diagnosis not present

## 2018-10-02 LAB — CBC WITH DIFFERENTIAL/PLATELET
Abs Immature Granulocytes: 0.01 10*3/uL (ref 0.00–0.07)
Basophils Absolute: 0 10*3/uL (ref 0.0–0.1)
Basophils Relative: 1 %
Eosinophils Absolute: 0.1 10*3/uL (ref 0.0–0.5)
Eosinophils Relative: 1 %
HCT: 39.9 % (ref 36.0–46.0)
Hemoglobin: 13.2 g/dL (ref 12.0–15.0)
Immature Granulocytes: 0 %
Lymphocytes Relative: 23 %
Lymphs Abs: 1.5 10*3/uL (ref 0.7–4.0)
MCH: 29.3 pg (ref 26.0–34.0)
MCHC: 33.1 g/dL (ref 30.0–36.0)
MCV: 88.5 fL (ref 80.0–100.0)
Monocytes Absolute: 0.3 10*3/uL (ref 0.1–1.0)
Monocytes Relative: 4 %
Neutro Abs: 4.5 10*3/uL (ref 1.7–7.7)
Neutrophils Relative %: 71 %
Platelets: 247 10*3/uL (ref 150–400)
RBC: 4.51 MIL/uL (ref 3.87–5.11)
RDW: 13.5 % (ref 11.5–15.5)
WBC: 6.4 10*3/uL (ref 4.0–10.5)
nRBC: 0 % (ref 0.0–0.2)

## 2018-10-02 LAB — HCG, QUANTITATIVE, PREGNANCY: hCG, Beta Chain, Quant, S: 134061 m[IU]/mL — ABNORMAL HIGH (ref <5)

## 2018-10-02 NOTE — ED Provider Notes (Signed)
[facility] Emergency Department Provider Note  ____________________________________________   None    (approximate)  I have reviewed the triage vital signs and the nursing notes.   HISTORY  Chief Complaint Vaginal Bleeding    HPI Sara Boyd is a 28 y.o. female presents emergency department complaining of vaginal cramping and bleeding.  She states she is about [redacted] weeks pregnant.  She had a subchorionic bleed with her previous pregnancy.  She states she had some cramping and then noticed blood on the toilet paper and a streak of blood on the pad.  No heavy bleeding noted.  [facility]  They recommended she come to the ED for evaluation due to the bleeding and the cramping.    Past Medical History:  Diagnosis Date  . Anxiety   . Chicken pox   . Depression   . Endometritis   . GERD (gastroesophageal reflux disease)   . Headache   . Medical history non-contributory   . Migraine   . OCD (obsessive compulsive disorder)   . Retained products of conception following abortion   . UTI (urinary tract infection)     Patient Active Problem List   Diagnosis Date Noted  . Episode of moderate major depression (HCC) 05/09/2018  . Anxiety and depression 05/04/2018  . Panic disorder 05/04/2018  . Obsessive-compulsive disorder 05/04/2018  . Chronic UTI 08/08/2017  . Chest pain 08/08/2017  . Anxiety 08/08/2017  . Myalgia 08/08/2017  . Gastroesophageal reflux disease 08/08/2017  . Class 3 severe obesity due to excess calories with body mass index (BMI) of 50.0 to 59.9 in adult (HCC) 08/08/2017  . Palpitations 08/08/2017  . Fatigue 08/08/2017  . Vitamin D deficiency 08/07/2017    Past Surgical History:  Procedure Laterality Date  . BLADDER SURGERY    . DILATION AND CURETTAGE OF UTERUS     elective AB  . DILATION AND EVACUATION N/A 10/29/2016   Procedure: DILATATION AND EVACUATION;  Surgeon: Nadara Mustard, MD;   Location: [facility] ORS;  Service: Gynecology;  Laterality: N/A;  . HERNIA REPAIR    . HERNIA REPAIR     2011  . TONSILLECTOMY     1998    Prior to Admission medications   Medication Sig Start Date End Date Taking? Authorizing Provider  ondansetron (ZOFRAN ODT) 4 MG disintegrating tablet Take 1 tablet (4 mg total) by mouth every 8 (eight) hours as needed for nausea or vomiting. 09/18/18   Shambley, Melody N, CNM  Vitamin D, Ergocalciferol, (DRISDOL) 1.25 MG (50000 UT) CAPS capsule Take 1 capsule (50,000 Units total) by mouth 2 (two) times a week. 08/31/18   Shambley, Melody N, CNM    Allergies Pineapple; Sulfa antibiotics; Latex; Vicodin [hydrocodone-acetaminophen]; Codeine; and Penicillins  Family History  Problem Relation Age of Onset  . Cancer Mother        NHL  . Hyperlipidemia Mother   . Hypertension Mother   . COPD Father   . Heart disease Father   . Hypertension Father   . Hyperlipidemia Father   . Diabetes Maternal Grandmother   . Heart disease Maternal Grandfather   . Cancer Paternal Grandmother        breast  . Sara death Paternal Grandfather   . Heart disease Paternal Grandfather     Social History Social History   Tobacco Use  . Smoking status: Former Games developer  . Smokeless tobacco: Never Used  Substance Use Topics  . Alcohol use: No  .  Drug use: No    Review of Systems  Constitutional: No fever/chills Eyes: No visual changes. ENT: No sore throat. Respiratory: Denies cough Genitourinary: Negative for dysuria.  Vaginal bleeding in pregnancy Musculoskeletal: Negative for back pain. Skin: Negative for rash.    ____________________________________________   PHYSICAL EXAM:  VITAL SIGNS: ED Triage Vitals  Enc Vitals Group     BP 10/02/18 1859 (!) 141/74     Pulse Rate 10/02/18 1859 (!) 102     Resp 10/02/18 1859 18     Temp 10/02/18 1859 99.4 F (37.4 C)     Temp Source 10/02/18 1859 Oral     SpO2 10/02/18 1859 94 %     Weight 10/02/18 1856 295 lb  (133.8 kg)     Height 10/02/18 1856  (1.626 m)     Head Circumference --      Peak Flow --      Pain Score 10/02/18 1856 4     Pain Loc --      Pain Edu? --      Excl. in GC? --     Constitutional: Alert and oriented. Well appearing and in no acute distress. Eyes: Conjunctivae are normal.  Head: Atraumatic. Nose: No congestion/rhinnorhea. Mouth/Throat: Mucous membranes are moist.   Neck:  supple no lymphadenopathy noted Cardiovascular: Normal rate, regular rhythm. Heart sounds are normal Respiratory: Normal respiratory effort.  No retractions, lungs c t a  Abd: soft nontender bs normal all 4 quad GU: deferred Musculoskeletal: FROM all extremities, warm and well perfused Neurologic:  Normal speech and language.  Skin:  Skin is warm, dry and intact. No rash noted. Psychiatric: Mood and affect are normal. Speech and behavior are normal.  ____________________________________________   LABS (all labs ordered are listed, but only abnormal results are displayed)  Labs Reviewed  HCG, QUANTITATIVE, PREGNANCY - Abnormal; Notable for the following components:      Result Value   hCG, Beta Chain, Quant, S 134,061 (*)    All other components within normal limits  CBC WITH DIFFERENTIAL/PLATELET   ____________________________________________   ____________________________________________  RADIOLOGY  Ultrasound OB less than 14 weeks  ____________________________________________   PROCEDURES  Procedure(s) performed: No  Procedures    ____________________________________________   INITIAL IMPRESSION / ASSESSMENT AND PLAN / ED COURSE  Pertinent labs & imaging results that were available during my care of the patient were reviewed by me and considered in my medical decision making (see chart for details).   Patient is a 28 year old female presents emergency department with vaginal cramping and bleeding.  History of subchorionic bleed with prior pregnancy.  Patient is  concerned she saw some bright red blood along with a streak of blood on a pad.  She is not bleeding any heavier.  No recent sexual intercourse.  Physical exam patient appears well.  Abdomen is minimally tender.  CBC is normal, beta hCG shows 134,061.   Previous records show ABO grouping O with Rh factor week D  ----------------------------------------- 8:46 PM on 10/02/2018 -----------------------------------------  Ultrasound OB less than 14 weeks shows a subchorionic hemorrhage.  Results were explained to the patient.  She is to follow-up with her OB/GYN.  Return emergency department if increased pain or bleeding.  States she understands will comply.  She was discharged stable condition.     As part of my medical decision making, I reviewed the following data within the electronic MEDICAL RECORD NUMBER Nursing notes reviewed and incorporated, Labs reviewed CBC is normal, beta hCG is  elevated, Old chart reviewed, Radiograph reviewed ultrasound/OB shows subchorionic hemorrhage, Notes from prior ED visits and [place] Controlled Substance Database  ____________________________________________   FINAL CLINICAL IMPRESSION(S) / ED DIAGNOSES  Final diagnoses:  Subchorionic hemorrhage of placenta in first trimester, single or unspecified fetus      NEW MEDICATIONS STARTED DURING THIS VISIT:  Current Discharge Medication List       Note:  This document was prepared using Dragon voice recognition software and may include unintentional dictation errors.    Faythe GheeFisher, Dacey Milberger W, PA-C 10/02/18 2047    Emily FilbertWilliams, Jonathan E, MD 10/02/18 2158

## 2018-10-02 NOTE — ED Notes (Signed)
PT TO THE ER FOR VAGINAL BLEEDING. PT REPORTS SOME PAIN TO THE RIGHT LOWER INGUINAL AREA BUT THAT HAS BEEN THERE SINCE THE START OF THE PREGNANCY. PT REPORTS BRIGHT RED BLOOD. PT HAS USED 2 PADS TODAY. NO HX OF MISCARRIAGE. PT IS G3P2. PT HAS A HEMORRHAGE WITH A PRIOR PREGNANCY. PT HAS NO BLOOD PRESENT ON THE CURRENT PAD. PT REPORTED DIZZINESS EARLIER, CONTACTED OBGYN AND THEY INSTRUCTED HER TO COME TO THE ED.

## 2018-10-02 NOTE — ED Triage Notes (Addendum)
Around 6pm this evening patient reports urinating and noticed streaks of bright red blood in toilet. Later started having nausea and bright red blood in pad. Also c/o pressure pelvic area. Patient is almost 11 weeks today.

## 2018-10-02 NOTE — ED Notes (Signed)
Patient AA0x4. Vitals stable. NAD. 

## 2018-10-02 NOTE — Discharge Instructions (Addendum)
Follow-up with your regular doctor.  Return emergency department if you have have increased abdominal pain and increased bleeding.

## 2018-10-02 NOTE — ED Notes (Signed)
Patients blood pressure verified twice. Patient stated "My blood pressure tends to run low when Im pregnant"

## 2018-10-03 ENCOUNTER — Other Ambulatory Visit: Payer: Self-pay | Admitting: Obstetrics and Gynecology

## 2018-10-03 ENCOUNTER — Telehealth: Payer: Self-pay

## 2018-10-03 NOTE — Telephone Encounter (Signed)
Coronavirus (COVID-19) Are you at risk?  Are you at risk for the Coronavirus (COVID-19)?  To be considered HIGH RISK for Coronavirus (COVID-19), you have to meet the following criteria:  . Traveled to [place], [place], [place], [place] or [place]; or in the [place] to [place], [place], [place], or [place]; and have fever, cough, and shortness of breath within the last 2 weeks of travel OR . Been in close contact with a person diagnosed with COVID-19 within the last 2 weeks and have fever, cough, and shortness of breath . IF YOU DO NOT MEET THESE CRITERIA, YOU ARE CONSIDERED LOW RISK FOR COVID-19.  What to do if you are HIGH RISK for COVID-19?  . If you are having a medical emergency, call 911. . Seek medical care right away. Before you go to a doctor's office, urgent care or emergency department, call ahead and tell them about your recent travel, contact with someone diagnosed with COVID-19, and your symptoms. You should receive instructions from your physician's office regarding next steps of care.  . When you arrive at healthcare provider, tell the healthcare staff immediately you have returned from visiting [place], [place], [place], [place] or [place]; or traveled in the [place] to [place], [place], [place], or [place]; in the last two weeks or you have been in close contact with a person diagnosed with COVID-19 in the last 2 weeks.   . Tell the health care staff about your symptoms: fever, cough and shortness of breath. . After you have been seen by a medical provider, you will be either: o Tested for (COVID-19) and discharged home on quarantine except to seek medical care if symptoms worsen, and asked to  - Stay home and avoid contact with others until you get your results (4-5 days)  - Avoid travel on public transportation if possible (such as bus, train, or airplane) or o Sent to the Emergency Department by EMS for evaluation, COVID-19 testing, and possible  admission depending on your condition and test results.  What to do if you are LOW RISK for COVID-19?  Reduce your risk of any infection by using the same precautions used for avoiding the common cold or flu:  . Wash your hands often with soap and warm water for at least 20 seconds.  If soap and water are not readily available, use an alcohol-based hand sanitizer with at least 60% alcohol.  . If coughing or sneezing, cover your mouth and nose by coughing or sneezing into the elbow areas of your shirt or coat, into a tissue or into your sleeve (not your hands). . Avoid shaking hands with others and consider head nods or verbal greetings only. . Avoid touching your eyes, nose, or mouth with unwashed hands.  . Avoid close contact with people who are [place] F[place]go. . Avoid places or events with large numbers of people in one location, like concerts or sporting events. . Carefully consider travel plans you have or are making. . If you are planning any travel outside or inside the [place], visit the CDC's Travelers' Health webpage for the latest health notices. . If you have some symptoms but not all symptoms, continue to monitor at home and seek medical attention if your symptoms worsen. . If you are having a medical emergency, call 911.  10/03/18 SCREENING NEG SLS ADDITIONAL HEALTHCARE OPTIONS FOR PATIENTS  [facility] / e-Visit: [email or web address]         [facility]: [phone]    [facility]: [phone]                   [facility]: [phone]  

## 2018-10-04 ENCOUNTER — Other Ambulatory Visit: Payer: Self-pay

## 2018-10-04 ENCOUNTER — Ambulatory Visit (INDEPENDENT_AMBULATORY_CARE_PROVIDER_SITE_OTHER): Payer: Medicaid Other | Admitting: Certified Nurse Midwife

## 2018-10-04 ENCOUNTER — Encounter: Payer: Medicaid Other | Admitting: Obstetrics and Gynecology

## 2018-10-04 ENCOUNTER — Encounter: Payer: Self-pay | Admitting: Certified Nurse Midwife

## 2018-10-04 VITALS — BP 111/87 | HR 104 | Wt 293.5 lb

## 2018-10-04 DIAGNOSIS — O209 Hemorrhage in early pregnancy, unspecified: Secondary | ICD-10-CM

## 2018-10-04 DIAGNOSIS — O208 Other hemorrhage in early pregnancy: Secondary | ICD-10-CM

## 2018-10-04 NOTE — Progress Notes (Signed)
Patient here to follow-up after 5/25 ER visit.  Patient c/o bright red vaginal bleeding last night with small clots, this morning still noticed light pink spotting when wiping, minimal dull ache in lower right pelvic area, taking Tylenol with some relief.

## 2018-10-04 NOTE — Progress Notes (Signed)
Subjective: Pt presents for follow up for ED. She was seen on 10/02/18 in ED for vaginal bleeding in pregnancy in the first trimester. U/s was performed which showed Subchorionic hemorrhage: There is a 1.4 x 0.4 cm subchorionic Hemorrhage.  Pt states bleeding has decreased. She denies pain or cramping. Pt state she has a subchorionic  hemorrhage with her last pregnancy but had no bleeding.    Objective:  CLINICAL DATA:  Vaginal bleeding  EXAM: OBSTETRIC <14 WK ULTRASOUND  TECHNIQUE: Transabdominal ultrasound was performed for evaluation of the gestation as well as the maternal uterus and adnexal regions.  COMPARISON:  None.  FINDINGS: Intrauterine gestational sac: Visualized  Yolk sac:  Not visualized  Embryo:  Visualized  Cardiac Activity: Visualized  Heart Rate: 173 bpm  CRL:  35 mm   10 w   3 d                  [place] [facility]: April 27, 2019  Subchorionic hemorrhage: There is a 1.4 x 0.4 cm subchorionic hemorrhage.  Maternal uterus/adnexae: Cervical os is closed. Right ovary measures 3.8 x 2.6 x 2.8 cm. No right-sided pelvic mass evident. Left ovary could not be visualized. No left-sided pelvic mass evident. No free pelvic fluid.  IMPRESSION: Single live intrauterine gestation with estimated gestational age of 10+ weeks. Small subchorionic hemorrhage evident. No extrauterine pelvic mass. Note that left ovary not visualized. No maternal free fluid.   Electronically Signed   By: Bretta Bang III M.D.   On: 10/02/2018 20:33 Assessment: Subchorionic Hemorrhage first trimester pregnancy  Plan: Watchful waiting and repeat u/s next week with NOB physical exam. Reviewed red flag symptoms and when to go to ED. Follow up as scheduled.Educational  Information given to pt.   I attest more than 50% of visit spent reviewing u/s results, discussing bleeding in pregnancy, discussing red flag symptoms, and developing a plan of care.   Doreene Burke, CNM

## 2018-10-04 NOTE — Patient Instructions (Signed)
Vaginal Bleeding During Pregnancy, First Trimester    A small amount of bleeding (spotting) from the vagina is common during early pregnancy. Sometimes the bleeding is normal and does not cause problems. At other times, though, bleeding may be a sign of something serious. Tell your doctor about any bleeding from your vagina right away.  Follow these instructions at home:  Activity  · Follow your doctor's instructions about how active you can be.  · If needed, make plans for someone to help with your normal activities.  · Do not have sex or orgasms until your doctor says that this is safe.  General instructions  · Take over-the-counter and prescription medicines only as told by your doctor.  · Watch your condition for any changes.  · Write down:  ? The number of pads you use each day.  ? How often you change pads.  ? How soaked (saturated) your pads are.  · Do not use tampons.  · Do not douche.  · If you pass any tissue from your vagina, save it to show to your doctor.  · Keep all follow-up visits as told by your doctor. This is important.  Contact a doctor if:  · You have vaginal bleeding at any time while you are pregnant.  · You have cramps.  · You have a fever.  Get help right away if:  · You have very bad cramps in your back or belly (abdomen).  · You pass large clots or a lot of tissue from your vagina.  · Your bleeding gets worse.  · You feel light-headed.  · You feel weak.  · You pass out (faint).  · You have chills.  · You are leaking fluid from your vagina.  · You have a gush of fluid from your vagina.  Summary  · Sometimes vaginal bleeding during pregnancy is normal and does not cause problems. At other times, bleeding may be a sign of something serious.  · Tell your doctor about any bleeding from your vagina right away.  · Follow your doctor's instructions about how active you can be. You may need someone to help you with your normal activities.  This information is not intended to replace advice given to  you by your health care provider. Make sure you discuss any questions you have with your health care provider.  Document Released: 09/10/2013 Document Revised: 07/28/2016 Document Reviewed: 07/28/2016  Elsevier Interactive Patient Education © 2019 [employer]

## 2018-10-11 ENCOUNTER — Encounter: Payer: Medicaid Other | Admitting: Obstetrics and Gynecology

## 2018-10-16 ENCOUNTER — Telehealth: Payer: Self-pay

## 2018-10-16 NOTE — Telephone Encounter (Signed)
Coronavirus (COVID-19) Are you at risk?  Are you at risk for the Coronavirus (COVID-19)?  To be considered HIGH RISK for Coronavirus (COVID-19), you have to meet the following criteria:  . Traveled to [place], [place], [place], [place] or [place]; or in the [place] to [place], [place], [place], or [place]; and have fever, cough, and shortness of breath within the last 2 weeks of travel OR . Been in close contact with a person diagnosed with COVID-19 within the last 2 weeks and have fever, cough, and shortness of breath . IF YOU DO NOT MEET THESE CRITERIA, YOU ARE CONSIDERED LOW RISK FOR COVID-19.  What to do if you are HIGH RISK for COVID-19?  . If you are having a medical emergency, call 911. . Seek medical care right away. Before you go to a doctor's office, urgent care or emergency department, call ahead and tell them about your recent travel, contact with someone diagnosed with COVID-19, and your symptoms. You should receive instructions from your physician's office regarding next steps of care.  . When you arrive at healthcare provider, tell the healthcare staff immediately you have returned from visiting [place], [place], [place], [place] or [place]; or traveled in the [place] to [place], [place], [place], or [place]; in the last two weeks or you have been in close contact with a person diagnosed with COVID-19 in the last 2 weeks.   . Tell the health care staff about your symptoms: fever, cough and shortness of breath. . After you have been seen by a medical provider, you will be either: o Tested for (COVID-19) and discharged home on quarantine except to seek medical care if symptoms worsen, and asked to  - Stay home and avoid contact with others until you get your results (4-5 days)  - Avoid travel on public transportation if possible (such as bus, train, or airplane) or o Sent to the Emergency Department by EMS for evaluation, COVID-19 testing, and possible  admission depending on your condition and test results.  What to do if you are LOW RISK for COVID-19?  Reduce your risk of any infection by using the same precautions used for avoiding the common cold or flu:  . Wash your hands often with soap and warm water for at least 20 seconds.  If soap and water are not readily available, use an alcohol-based hand sanitizer with at least 60% alcohol.  . If coughing or sneezing, cover your mouth and nose by coughing or sneezing into the elbow areas of your shirt or coat, into a tissue or into your sleeve (not your hands). . Avoid shaking hands with others and consider head nods or verbal greetings only. . Avoid touching your eyes, nose, or mouth with unwashed hands.  . Avoid close contact with people who are Josette Shimabukuro. . Avoid places or events with large numbers of people in one location, like concerts or sporting events. . Carefully consider travel plans you have or are making. . If you are planning any travel outside or inside the [place], visit the CDC's Travelers' Health webpage for the latest health notices. . If you have some symptoms but not all symptoms, continue to monitor at home and seek medical attention if your symptoms worsen. . If you are having a medical emergency, call 911.  10/16/18 SCREENING NEG SLS ADDITIONAL HEALTHCARE OPTIONS FOR PATIENTS  [facility] / e-Visit: [email or web address]         [facility]: [phone]    [facility]: [phone]                   [facility]: [phone]  

## 2018-10-17 ENCOUNTER — Other Ambulatory Visit: Payer: Self-pay

## 2018-10-17 ENCOUNTER — Ambulatory Visit (INDEPENDENT_AMBULATORY_CARE_PROVIDER_SITE_OTHER): Payer: Medicaid Other

## 2018-10-17 ENCOUNTER — Encounter: Payer: Self-pay | Admitting: Certified Nurse Midwife

## 2018-10-17 ENCOUNTER — Encounter: Payer: Medicaid Other | Admitting: Obstetrics and Gynecology

## 2018-10-17 ENCOUNTER — Ambulatory Visit (INDEPENDENT_AMBULATORY_CARE_PROVIDER_SITE_OTHER): Payer: Medicaid Other | Admitting: Certified Nurse Midwife

## 2018-10-17 VITALS — BP 103/68 | HR 95 | Wt 297.2 lb

## 2018-10-17 DIAGNOSIS — Z3A12 12 weeks gestation of pregnancy: Secondary | ICD-10-CM

## 2018-10-17 DIAGNOSIS — Z3481 Encounter for supervision of other normal pregnancy, first trimester: Secondary | ICD-10-CM

## 2018-10-17 DIAGNOSIS — Z3A13 13 weeks gestation of pregnancy: Secondary | ICD-10-CM

## 2018-10-17 DIAGNOSIS — N8311 Corpus luteum cyst of right ovary: Secondary | ICD-10-CM | POA: Diagnosis not present

## 2018-10-17 DIAGNOSIS — O209 Hemorrhage in early pregnancy, unspecified: Secondary | ICD-10-CM

## 2018-10-17 DIAGNOSIS — O3481 Maternal care for other abnormalities of pelvic organs, first trimester: Secondary | ICD-10-CM

## 2018-10-17 LAB — POCT URINALYSIS DIPSTICK OB
Bilirubin, UA: NEGATIVE
Blood, UA: NEGATIVE
Glucose, UA: NEGATIVE
Ketones, UA: NEGATIVE
Leukocytes, UA: NEGATIVE
Nitrite, UA: NEGATIVE
Spec Grav, UA: 1.02 (ref 1.010–1.025)
Urobilinogen, UA: 0.2 E.U./dL
pH, UA: 7.5 (ref 5.0–8.0)

## 2018-10-17 NOTE — Patient Instructions (Signed)

## 2018-10-17 NOTE — Progress Notes (Signed)
NEW OB HISTORY AND PHYSICAL  SUBJECTIVE:       Sara Boyd is a 28 y.o. [phone] female, Patient's last menstrual period was 07/21/2018 (exact date)., Estimated Date of Delivery: 04/25/19, 7586w6d, presents today for establishment of Prenatal Care. She has no unusual complaints.    Gynecologic History Patient's last menstrual period was 07/21/2018 (exact date). Normal Contraception: none Last Pap: unsure.  Obstetric History OB History  Gravida Para Term Preterm AB Living  4 2 1 1 1 2   SAB TAB Ectopic Multiple Live Births  1 0 0 0 2    # Outcome Date GA Lbr Len/2nd Weight Sex Delivery Anes PTL Lv  4 Current           3 SAB 2018          2 Term 2014   6 lb 9 oz (2.977 kg) M Vag-Spont   LIV  1 Preterm 2010   5 lb 1 oz (2.296 kg) F Vag-Spont   LIV    Obstetric Comments  Pt water ruptured @ 19 weeks, bed rest, baby with CP    Past Medical History:  Diagnosis Date  . Anxiety   . Chicken pox   . Depression   . Endometritis   . GERD (gastroesophageal reflux disease)   . Headache   . Medical history non-contributory   . Migraine   . OCD (obsessive compulsive disorder)   . Retained products of conception following abortion   . UTI (urinary tract infection)     Past Surgical History:  Procedure Laterality Date  . BLADDER SURGERY    . DILATION AND CURETTAGE OF UTERUS     elective AB  . DILATION AND EVACUATION N/A 10/29/2016   Procedure: DILATATION AND EVACUATION;  Surgeon: Nadara MustardHarris, Robert P, MD;  Location: [facility] ORS;  Service: Gynecology;  Laterality: N/A;  . HERNIA REPAIR    . HERNIA REPAIR     2011  . TONSILLECTOMY     1998    Current Outpatient Medications on File Prior to Visit  Medication Sig Dispense Refill  . ondansetron (ZOFRAN ODT) 4 MG disintegrating tablet Take 1 tablet (4 mg total) by mouth every 8 (eight) hours as needed for nausea or vomiting. 30 tablet 2  . Vitamin D, Ergocalciferol, (DRISDOL) 1.25 MG (50000 UT) CAPS capsule Take 1 capsule  (50,000 Units total) by mouth 2 (two) times a week. 30 capsule 2   No current facility-administered medications on file prior to visit.     Allergies  Allergen Reactions  . Pineapple Shortness Of Breath    Pt states her throat shuts completely. Anything pineapple, pt states it is juice, the whole fruit, or flavoring.   . Sulfa Antibiotics Shortness Of Breath    Pt states her throat closes as well, tightness.   . Latex Itching    Pt states her throat also starts closing.   . Vicodin [Hydrocodone-Acetaminophen] Other (See Comments)    Hallucinations.   . Codeine Rash    Pt states it feels like her skin is coming off.   . Penicillins Rash    Pt states it feels like her skin is coming off.     Social History   Socioeconomic History  . Marital status: Married    Spouse name: Not on file  . Number of children: Not on file  . Years of education: Not on file  . Highest education level: Not on file  Occupational History  . Not on file  Social  Needs  . Financial resource strain: Not on file  . Food insecurity:    Worry: Not on file    Inability: Not on file  . Transportation needs:    Medical: Not on file    Non-medical: Not on file  Tobacco Use  . Smoking status: Former Research scientist (life sciences)  . Smokeless tobacco: Never Used  Substance and Sexual Activity  . Alcohol use: No  . Drug use: No  . Sexual activity: Yes    Birth control/protection: None  Lifestyle  . Physical activity:    Days per week: Not on file    Minutes per session: Not on file  . Stress: Not on file  Relationships  . Social connections:    Talks on phone: Not on file    Gets together: Not on file    Attends religious service: Not on file    Active member of club or organization: Not on file    Attends meetings of clubs or organizations: Not on file    Relationship status: Not on file  . Intimate partner violence:    Fear of current or ex partner: Not on file    Emotionally abused: Not on file    Physically  abused: Not on file    Forced sexual activity: Not on file  Other Topics Concern  . Not on file  Social History Narrative   HS ed    Married    2 kids    Former smoker    Wears seat belt    Safe in relationship    Family History  Problem Relation Age of Onset  . Cancer Mother        NHL  . Hyperlipidemia Mother   . Hypertension Mother   . COPD Father   . Heart disease Father   . Hypertension Father   . Hyperlipidemia Father   . Diabetes Maternal Grandmother   . Heart disease Maternal Grandfather   . Cancer Paternal Grandmother        breast  . Early death Paternal Grandfather   . Heart disease Paternal Grandfather     The following portions of the patient's history were reviewed and updated as appropriate: allergies, current medications, past OB history, past medical history, past surgical history, past family history, past social history, and problem list.    OBJECTIVE: Initial Physical Exam (New OB)  GENERAL APPEARANCE: alert, well appearing, in no apparent distress, oriented to person, place and time, overweight HEAD: normocephalic, atraumatic MOUTH: mucous membranes moist, pharynx normal without lesions THYROID: no thyromegaly or masses present BREASTS: no masses noted, no significant tenderness, no palpable axillary nodes, no skin changes LUNGS: clear to auscultation, no wheezes, rales or rhonchi, symmetric air entry HEART: regular rate and rhythm, no murmurs ABDOMEN: soft, nontender, nondistended, no abnormal masses, no epigastric pain, obese, FHT not heard and pt has follow up u/s today for subchorionic hemorrhage @ 10 wks.  EXTREMITIES: no redness or tenderness in the calves or thighs, no edema, no limitation in range of motion, intact peripheral pulses SKIN: normal coloration and turgor, no rashes LYMPH NODES: no adenopathy palpable NEUROLOGIC: alert, oriented, normal speech, no focal findings or movement disorder noted  PELVIC EXAM EXTERNAL GENITALIA:  normal appearing vulva with no masses, tenderness or lesions VAGINA: no abnormal discharge or lesions CERVIX: no lesions or cervical motion tenderness, pap collected, contact bleeding present UTERUS: gravid ADNEXA: no masses palpable and nontender OB EXAM PELVIMETRY: appears adequate RECTUM: exam not indicated Patient Name: Sara Boyd  Emelda FearFerguson DOB: 08-20-1990 MRN: [id number] ULTRASOUND REPORT  Location: [facility] Date of Service: 10/17/2018   Indications:dating Findings:  Mason JimSingleton intrauterine pregnancy is visualized with a CRL consistent with 3144w3d gestation, giving an (U/S) EDD of 04/21/2019.   FHR: 161 BPM CRL measurement: 73.6 mm Yolk sac is not visualized and early anatomy is normal. Amnion: not visualized   Right Ovary is normal in appearance. Left Ovary is normal appearance. Corpus luteal cyst:  Right ovary Survey of the adnexa demonstrates no adnexal masses. There is no free peritoneal fluid in the cul de sac.  Impression: 1. 3444w3d Viable Singleton Intrauterine pregnancy by U/S. 2. (U/S) EDD is  04/21/2019.  Recommendations: 1.Clinical correlation with the patient's History and Physical Exam.  Jenine M. Marciano SequinAlessi     RDMS  ASSESSMENT: Normal pregnancy  PLAN:  OB counseling: The patient has been given an overview regarding routine prenatal care. Recommendations regarding diet, weight gain, and exercise in pregnancy were given. Prenatal testing, optional genetic testing, carrier screening, and ultrasound use in pregnancy were reviewed. Panorama testing completed today.  Early glucose screen @ 16 wks . Benefits of Breast Feeding were discussed. The patient is encouraged to consider nursing her baby post partum. Follow up 4 wks with Melody .   Doreene BurkeAnnie Kim Oki, CNM

## 2018-10-18 ENCOUNTER — Other Ambulatory Visit (HOSPITAL_COMMUNITY)
Admission: RE | Admit: 2018-10-18 | Discharge: 2018-10-18 | Disposition: A | Discharge status: Home / Self Care | Payer: Medicaid Other | Source: Ambulatory Visit | Attending: Obstetrics and Gynecology | Admitting: Obstetrics and Gynecology

## 2018-10-18 DIAGNOSIS — Z3481 Encounter for supervision of other normal pregnancy, first trimester: Secondary | ICD-10-CM | POA: Insufficient documentation

## 2018-10-18 LAB — CBC
Hematocrit: 35.3 % (ref 34.0–46.6)
Hemoglobin: 12.1 g/dL (ref 11.1–15.9)
MCH: 29.8 pg (ref 26.6–33.0)
MCHC: 34.3 g/dL (ref 31.5–35.7)
MCV: 87 fL (ref 79–97)
Platelets: 242 10*3/uL (ref 150–450)
RBC: 4.06 x10E6/uL (ref 3.77–5.28)
RDW: 13.3 % (ref 11.7–15.4)
WBC: 6.4 10*3/uL (ref 3.4–10.8)

## 2018-10-18 IMAGING — US US EXTREM LOW VENOUS*R*
1 series · 13 of 24 positions shown · non-contrast
Comparison: None.

CLINICAL DATA: 25-year-old female with a history of right calf pain



[Series 1: us extrem low venous*right* · 0.08mm/px · 13 of 38 slices shown]
[im 1/38]
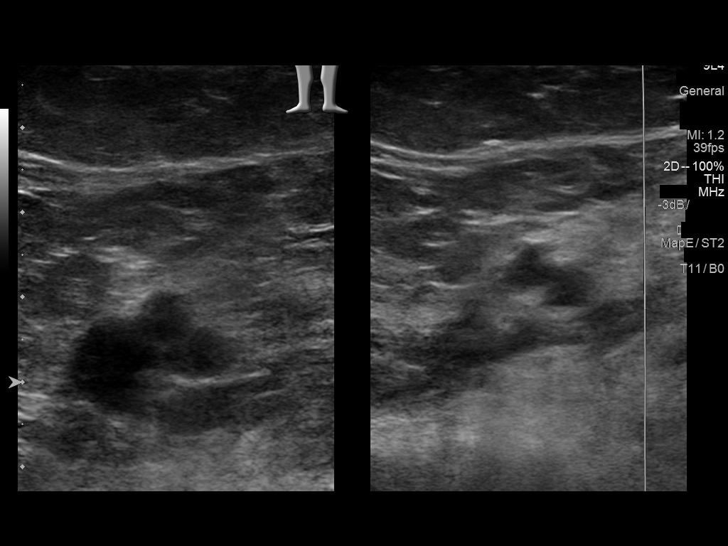
[im 4/38]
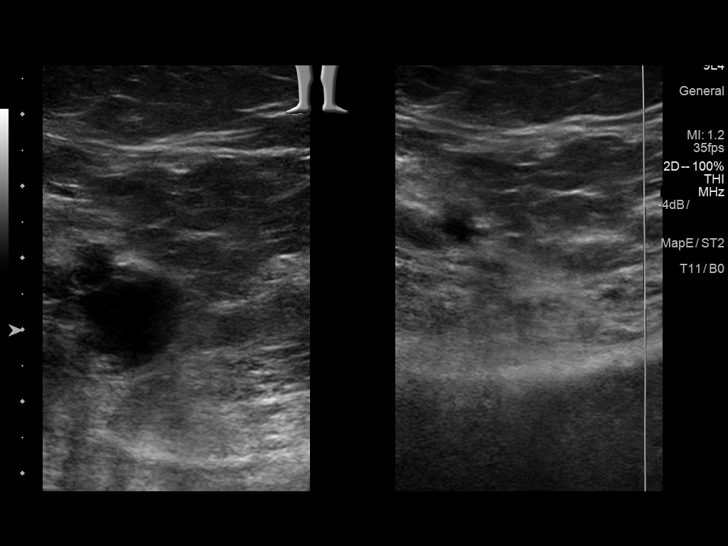
[im 7/38]
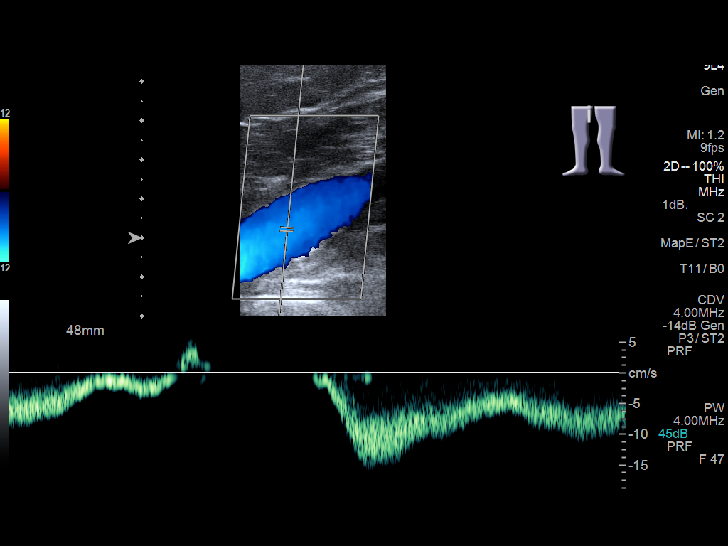
[im 10/38]
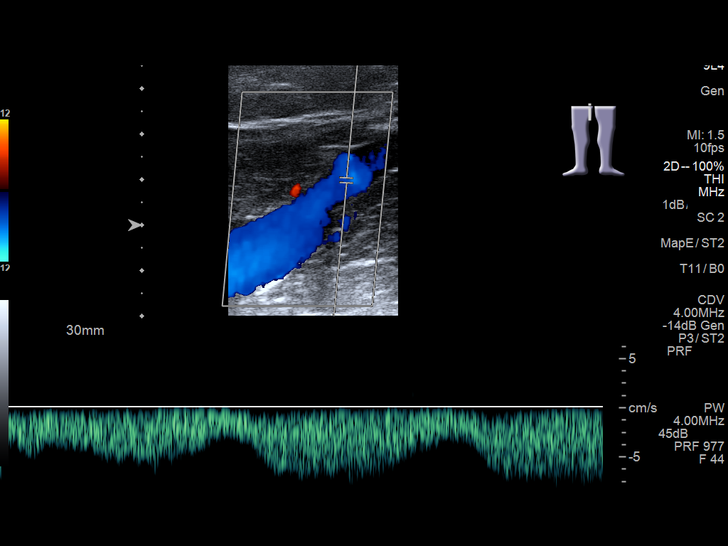
[im 13/38]
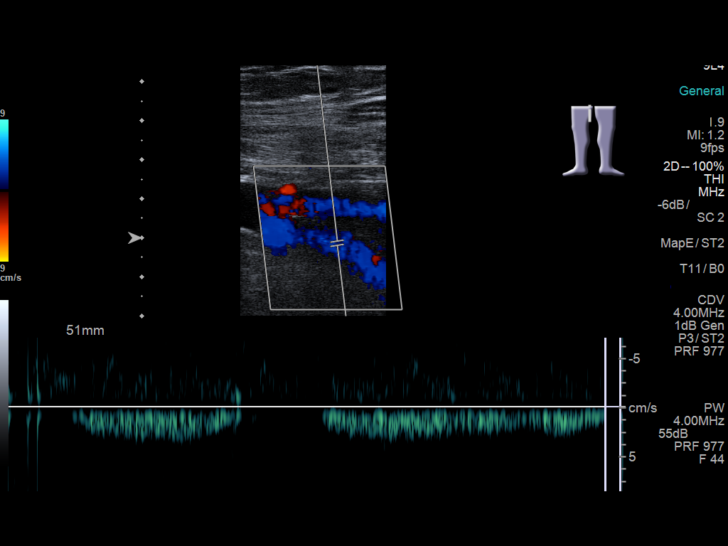
[im 17/38]
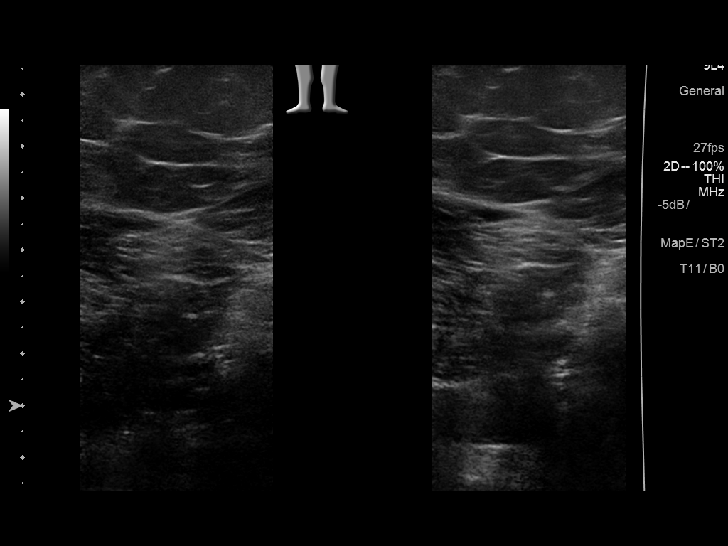
[im 20/38]
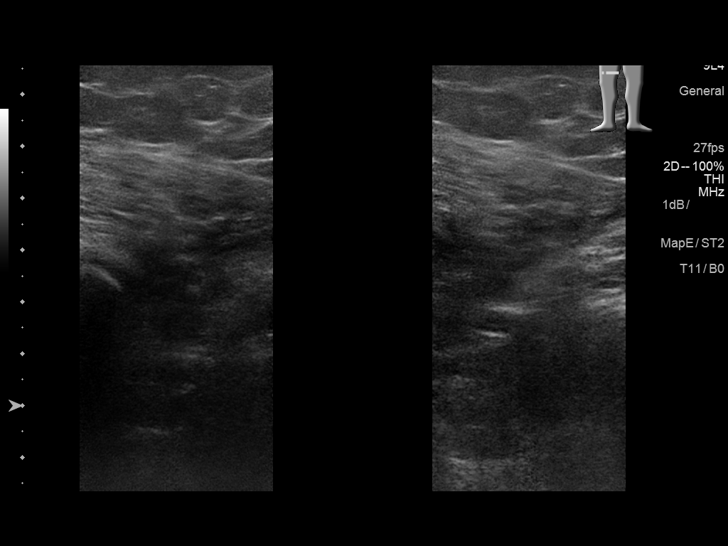
[im 21/38]
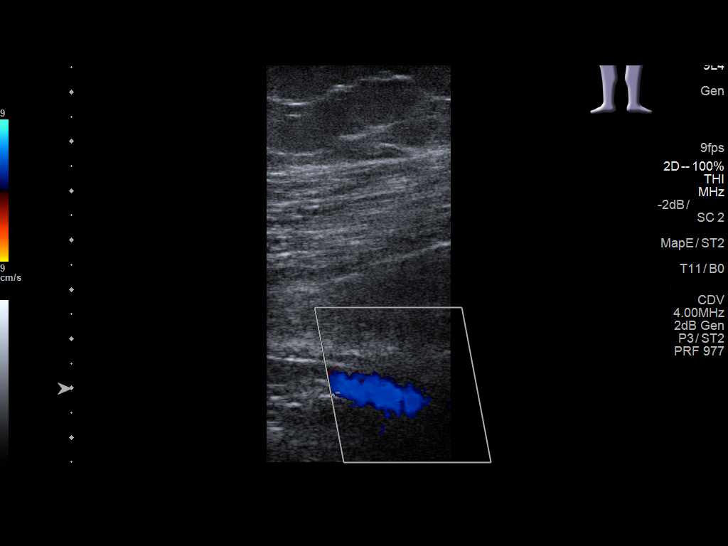
[im 25/38]
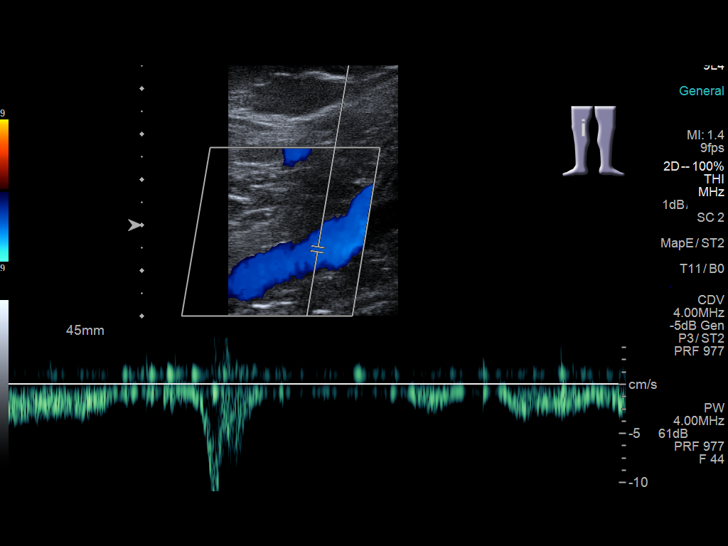
[im 28/38]
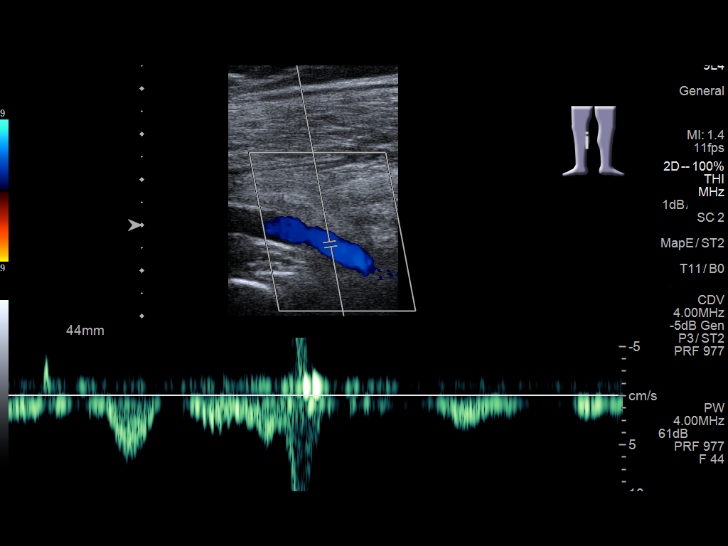
[im 31/38]
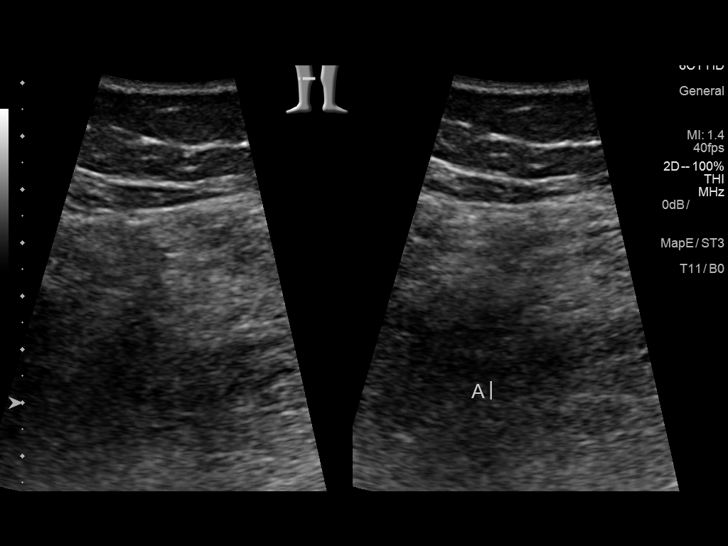
[im 34/38]
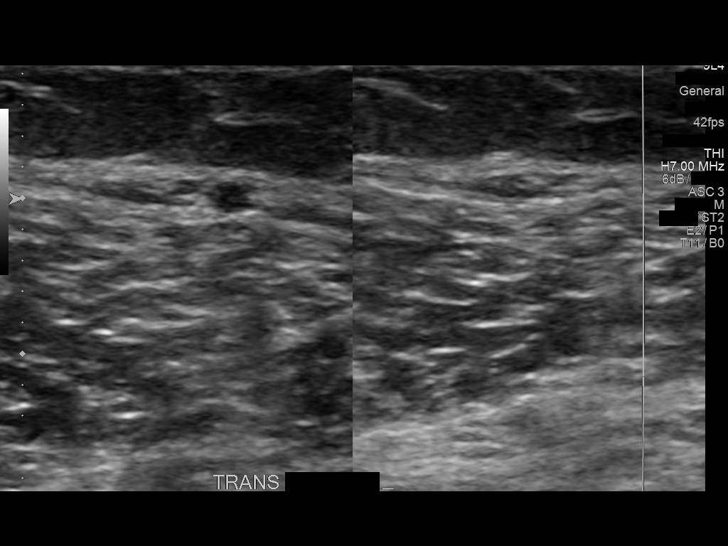
[im 38/38]
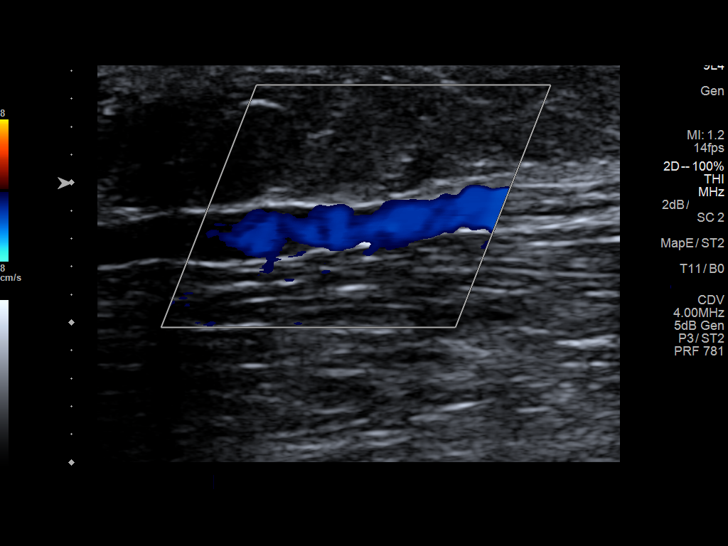

[13 of 24 positions shown; findings below may reference images not displayed]

FINDINGS: Contralateral Common Femoral Vein: Respiratory phasicity is normal
and symmetric with the symptomatic side. No evidence of thrombus.
Normal compressibility.

Common Femoral Vein: No evidence of thrombus. Normal
compressibility, respiratory phasicity and response to augmentation.

Saphenofemoral Junction: No evidence of thrombus. Normal
compressibility and flow on color Doppler imaging.

Profunda Femoral Vein: No evidence of thrombus. Normal
compressibility and flow on color Doppler imaging.

Femoral Vein: No evidence of thrombus. Normal compressibility,
respiratory phasicity and response to augmentation.

Popliteal Vein: No evidence of thrombus. Normal compressibility,
respiratory phasicity and response to augmentation.

Calf Veins: No evidence of thrombus. Normal compressibility and flow
on color Doppler imaging.

Superficial Great Saphenous Vein: No evidence of thrombus. Normal
compressibility and flow on color Doppler imaging.

Other Findings:  None.
IMPRESSION: Sonographic survey of the right lower extremity negative for DVT

## 2018-10-18 NOTE — Addendum Note (Signed)
Addended by: Hildred Priest on: 10/18/2018 08:15 AM   Modules accepted: Orders

## 2018-10-23 ENCOUNTER — Telehealth: Payer: Self-pay

## 2018-10-23 ENCOUNTER — Telehealth: Payer: Self-pay | Admitting: Obstetrics and Gynecology

## 2018-10-23 LAB — CYTOLOGY - PAP: Diagnosis: NEGATIVE

## 2018-10-23 NOTE — Telephone Encounter (Signed)
Spoke with patient- she states she got a call from [facility] about screening and she wasn't sure what it was. Pt has an appointment for anesthesia consult. She will call them back.

## 2018-10-23 NOTE — Telephone Encounter (Signed)
The patient called and stated that she needs to speak with a nurse in regards to her receiving a call from th hospital needing to do a screening. The patient is wanting to know if this is something that our office is requiring her to do. Please advise. Pt requesting call back.

## 2018-11-03 ENCOUNTER — Inpatient Hospital Stay: Admission: RE | Admit: 2018-11-03 | Payer: Medicaid Other | Source: Ambulatory Visit

## 2018-11-15 ENCOUNTER — Telehealth: Payer: Self-pay

## 2018-11-15 NOTE — Telephone Encounter (Signed)
Coronavirus (COVID-19) Are you at risk?  Are you at risk for the Coronavirus (COVID-19)?  To be considered HIGH RISK for Coronavirus (COVID-19), you have to meet the following criteria:  . Traveled to [place], [place], [place], [place] or [place]; or in the [place] to [place], [place], [place], or [place]; and have fever, cough, and shortness of breath within the last 2 weeks of travel OR . Been in close contact with a person diagnosed with COVID-19 within the last 2 weeks and have fever, cough, and shortness of breath . IF YOU DO NOT MEET THESE CRITERIA, YOU ARE CONSIDERED LOW RISK FOR COVID-19.  What to do if you are HIGH RISK for COVID-19?  . If you are having a medical emergency, call 911. . Seek medical care right away. Before you go to a doctor's office, urgent care or emergency department, call ahead and tell them about your recent travel, contact with someone diagnosed with COVID-19, and your symptoms. You should receive instructions from your physician's office regarding next steps of care.  . When you arrive at healthcare provider, tell the healthcare staff immediately you have returned from visiting [place], [place], [place], [place] or [place]; or traveled in the [place] to [place], [place], [place], or [place]; in the last two weeks or you have been in close contact with a person diagnosed with COVID-19 in the last 2 weeks.   . Tell the health care staff about your symptoms: fever, cough and shortness of breath. . After you have been seen by a medical provider, you will be either: o Tested for (COVID-19) and discharged home on quarantine except to seek medical care if symptoms worsen, and asked to  - Stay home and avoid contact with others until you get your results (4-5 days)  - Avoid travel on public transportation if possible (such as bus, train, or airplane) or o Sent to the Emergency Department by EMS for evaluation, COVID-19 testing, and possible  admission depending on your condition and test results.  What to do if you are LOW RISK for COVID-19?  Reduce your risk of any infection by using the same precautions used for avoiding the common cold or flu:  . Wash your hands often with soap and warm water for at least 20 seconds.  If soap and water are not readily available, use an alcohol-based hand sanitizer with at least 60% alcohol.  . If coughing or sneezing, cover your mouth and nose by coughing or sneezing into the elbow areas of your shirt or coat, into a tissue or into your sleeve (not your hands). . Avoid shaking hands with others and consider head nods or verbal greetings only. . Avoid touching your eyes, nose, or mouth with unwashed hands.  . Avoid close contact with people who are Azim Gillingham. . Avoid places or events with large numbers of people in one location, like concerts or sporting events. . Carefully consider travel plans you have or are making. . If you are planning any travel outside or inside the [place], visit the CDC's Travelers' Health webpage for the latest health notices. . If you have some symptoms but not all symptoms, continue to monitor at home and seek medical attention if your symptoms worsen. . If you are having a medical emergency, call 911.  11/15/18 SCREENING NEG SLS ADDITIONAL HEALTHCARE OPTIONS FOR PATIENTS  [facility] / e-Visit: [email or web address]         [facility]: [phone]    [facility]: [phone]                   [facility]: [phone]  

## 2018-11-16 ENCOUNTER — Other Ambulatory Visit: Payer: Self-pay

## 2018-11-16 ENCOUNTER — Ambulatory Visit (INDEPENDENT_AMBULATORY_CARE_PROVIDER_SITE_OTHER): Payer: Medicaid Other | Admitting: Obstetrics and Gynecology

## 2018-11-16 ENCOUNTER — Other Ambulatory Visit: Payer: Medicaid Other

## 2018-11-16 VITALS — BP 101/63 | HR 83 | Temp 98.6°F | Wt 293.2 lb

## 2018-11-16 DIAGNOSIS — K5900 Constipation, unspecified: Secondary | ICD-10-CM | POA: Insufficient documentation

## 2018-11-16 DIAGNOSIS — Z3481 Encounter for supervision of other normal pregnancy, first trimester: Secondary | ICD-10-CM

## 2018-11-16 DIAGNOSIS — O9989 Other specified diseases and conditions complicating pregnancy, childbirth and the puerperium: Secondary | ICD-10-CM

## 2018-11-16 DIAGNOSIS — R350 Frequency of micturition: Secondary | ICD-10-CM

## 2018-11-16 LAB — POCT URINALYSIS DIPSTICK OB
Bilirubin, UA: NEGATIVE
Glucose, UA: NEGATIVE
Ketones, UA: NEGATIVE
Leukocytes, UA: NEGATIVE
Nitrite, UA: POSITIVE
POC,PROTEIN,UA: NEGATIVE
Spec Grav, UA: 1.02 (ref 1.010–1.025)
Urobilinogen, UA: 0.2 E.U./dL
pH, UA: 6 (ref 5.0–8.0)

## 2018-11-16 MED ORDER — POLYETHYLENE GLYCOL 3350 17 GM/SCOOP PO POWD: 1.0000 | Freq: Once | ORAL | 2 refills | Status: AC

## 2018-11-16 MED ORDER — CEPHALEXIN 500 MG PO CAPS: 500.0000 mg | ORAL_CAPSULE | Freq: Four times a day (QID) | ORAL | 2 refills | Status: DC

## 2018-11-16 NOTE — Progress Notes (Signed)
ROB & early glucola-still struggling with nausea and vomiting- intermittent. Constipation worse- but relieved with enema. Will add miralax UTI- will treat with antibiotic.discussed possible transfer to physicians due to BMI. Will await anesthesia consult and early glucola.

## 2018-11-17 LAB — GLUCOSE TOLERANCE, 1 HOUR: Glucose, 1Hr PP: 167 mg/dL (ref 65–199)

## 2018-11-18 LAB — URINE CULTURE

## 2018-11-20 ENCOUNTER — Other Ambulatory Visit: Payer: Self-pay | Admitting: Certified Nurse Midwife

## 2018-11-20 ENCOUNTER — Other Ambulatory Visit: Payer: Self-pay

## 2018-11-20 DIAGNOSIS — R7309 Other abnormal glucose: Secondary | ICD-10-CM

## 2018-11-20 MED ORDER — POLYETHYLENE GLYCOL 3350 17 GM/SCOOP PO POWD: ORAL | 1 refills | Status: DC

## 2018-11-20 NOTE — Progress Notes (Signed)
Early glucose screen positive. # hr test ordered.

## 2018-11-22 ENCOUNTER — Encounter: Payer: Self-pay | Admitting: Internal Medicine

## 2018-11-22 ENCOUNTER — Other Ambulatory Visit: Payer: Medicaid Other

## 2018-11-28 ENCOUNTER — Encounter
Admission: RE | Admit: 2018-11-28 | Discharge: 2018-11-28 | Disposition: A | Discharge status: Home / Self Care | Payer: Medicaid Other | Source: Ambulatory Visit | Attending: Anesthesiology | Admitting: Anesthesiology

## 2018-11-28 ENCOUNTER — Other Ambulatory Visit: Payer: Self-pay

## 2018-11-28 NOTE — Consult Note (Signed)
[facility] Anesthesia Consultation  Domitila Stetler NWG:[id number] DOB: [birth year] DOA: 11/28/2018 PCP: McLean-Scocuzza, Nino Glow, MD   Requesting physician: Lorelle Gibbs, CNM Date of consultation: 11/28/18 Reason for consultation: Obesity during pregnancy  CHIEF COMPLAINT:  Obesity during pregnancy  HISTORY OF PRESENT ILLNESS: Davyn Elsasser  is a 28 y.o. female with a known history of obesity in pregnancy. She has had 2 prior vaginal deliveries, both with epidural for labor analgesia. Denies hx of cardiovascular disease, has been having some atypical chest pain and is going to be having stress test with cardiology, has had several stress tests and echos in the past that were normal. Denies hx of asthma. Denies personal or family hx of bleeding disorders. Denies hx of problems with anesthesia.  PAST MEDICAL HISTORY:   Past Medical History:  Diagnosis Date  . Anxiety   . Chicken pox   . Depression   . Endometritis   . GERD (gastroesophageal reflux disease)   . Headache   . Medical history non-contributory   . Migraine   . OCD (obsessive compulsive disorder)   . Retained products of conception following abortion   . UTI (urinary tract infection)     PAST SURGICAL HISTORY:  Past Surgical History:  Procedure Laterality Date  . BLADDER SURGERY    . DILATION AND CURETTAGE OF UTERUS     elective AB  . DILATION AND EVACUATION N/A 10/29/2016   Procedure: DILATATION AND EVACUATION;  Surgeon: Gae Dry, MD;  Location: [facility] ORS;  Service: Gynecology;  Laterality: N/A;  . HERNIA REPAIR    . HERNIA REPAIR     2011  . TONSILLECTOMY     1998    SOCIAL HISTORY:  Social History   Tobacco Use  . Smoking status: Former Research scientist (life sciences)  . Smokeless tobacco: Never Used  Substance Use Topics  . Alcohol use: No    FAMILY HISTORY:  Family History  Problem Relation Age of Onset  . Cancer Mother        NHL  . Hyperlipidemia Mother    . Hypertension Mother   . COPD Father   . Heart disease Father   . Hypertension Father   . Hyperlipidemia Father   . Diabetes Maternal Grandmother   . Heart disease Maternal Grandfather   . Cancer Paternal Grandmother        breast  . Early death Paternal Grandfather   . Heart disease Paternal Grandfather     DRUG ALLERGIES:  Allergies  Allergen Reactions  . Pineapple Shortness Of Breath    Pt states her throat shuts completely. Anything pineapple, pt states it is juice, the whole fruit, or flavoring.   . Sulfa Antibiotics Shortness Of Breath    Pt states her throat closes as well, tightness.   . Latex Itching    Pt states her throat also starts closing.   . Vicodin [Hydrocodone-Acetaminophen] Other (See Comments)    Hallucinations.   . Codeine Rash    Pt states it feels like her skin is coming off.   . Penicillins Rash    Pt states it feels like her skin is coming off.     REVIEW OF SYSTEMS:   RESPIRATORY: No cough, shortness of breath, wheezing.  CARDIOVASCULAR: No chest pain, orthopnea, edema.  HEMATOLOGY: No anemia, easy bruising or bleeding SKIN: No rash or lesion. NEUROLOGIC: No tingling, numbness, weakness.  PSYCHIATRY: No anxiety or depression.   MEDICATIONS AT HOME:  Prior to Admission medications   Medication Sig  Start Date End Date Taking? Authorizing Provider  cephALEXin (KEFLEX) 500 MG capsule Take 1 capsule (500 mg total) by mouth 4 (four) times daily. 11/16/18   Shambley, Melody N, CNM  docusate sodium (COLACE) 100 MG capsule Take 100 mg by mouth 2 (two) times daily.    [provider]  ondansetron (ZOFRAN ODT) 4 MG disintegrating tablet Take 1 tablet (4 mg total) by mouth every 8 (eight) hours as needed for nausea or vomiting. 09/18/18   Shambley, Melody N, CNM  polyethylene glycol powder (GLYCOLAX/MIRALAX) 17 GM/SCOOP powder DISSOLVE 238 GRAMS IN LIQUID AND DRINK ONCE FOR 1 DOSE. TO USE EVERY 3RD DAY IF NO BOWEL MOVEMENT OCCURS SPONTANEOUSLY  11/20/18   Shambley, Melody N, CNM  Prenatal Vit-Fe Fumarate-FA (PRENATAL MULTIVITAMIN) TABS tablet Take 1 tablet by mouth daily at 12 noon.    [provider]  Vitamin D, Ergocalciferol, (DRISDOL) 1.25 MG (50000 UT) CAPS capsule Take 1 capsule (50,000 Units total) by mouth 2 (two) times a week. 08/31/18   Shambley, Melody N, CNM      PHYSICAL EXAMINATION:   VITAL SIGNS: Last menstrual period 07/21/2018, unknown if currently breastfeeding.  GENERAL:  28 y.o.-year-old patient no acute distress.  HEENT: Head atraumatic, normocephalic. Oropharynx and nasopharynx clear. MP 2, TM distance >3 cm, normal mouth opening. LUNGS: Normal breath sounds bilaterally, no wheezing, rales,rhonchi. No use of accessory muscles of respiration.  CARDIOVASCULAR: S1, S2 normal. No murmurs, rubs, or gallops.  EXTREMITIES: No pedal edema, cyanosis, or clubbing.  NEUROLOGIC: normal gait PSYCHIATRIC: The patient is alert and oriented x 3.  SKIN: No obvious rash, lesion, or ulcer.    IMPRESSION AND PLAN:   Brunilda Payorlizabeth Kuhar  is a 28 y.o. female presenting with obesity during pregnancy. BMI is currently 50 at [redacted] weeks gestation.   Airway exam reassuring today. Spine midline easily identified, interspaces minimally palpable.   We discussed analgesic options during labor including epidural analgesia. Discussed that in obesity there can be increased difficulty with epidural placement or even failure of successful epidural. We also discussed that even after successful epidural placement there is increased risk of catheter migration out of the epidural space that would require catheter replacement. Discussed use of epidural vs spinal vs GA if cesarean delivery is required. Discussed increased risk of difficult intubation during pregnancy should an emergency cesarean delivery be required.   We discussed repeat evaluation at 34-35 weeks by anesthesia to determine whether there is a high risk of complications of  anesthesia for which we would recommend transfer of OB care to a facility with a higher maternal level of care designation.   Also discussed that if there are abnormal results on cardiac testing that may necessitate transfer to higher level of care.

## 2018-11-29 ENCOUNTER — Other Ambulatory Visit: Payer: Medicaid Other

## 2018-11-29 DIAGNOSIS — Z3481 Encounter for supervision of other normal pregnancy, first trimester: Secondary | ICD-10-CM

## 2018-11-29 DIAGNOSIS — R7309 Other abnormal glucose: Secondary | ICD-10-CM

## 2018-11-30 LAB — GESTATIONAL GLUCOSE TOLERANCE
Glucose, Fasting: 90 mg/dL (ref 65–94)
Glucose, GTT - 1 Hour: 181 mg/dL — ABNORMAL HIGH (ref 65–179)
Glucose, GTT - 2 Hour: 145 mg/dL (ref 65–154)
Glucose, GTT - 3 Hour: 113 mg/dL (ref 65–139)

## 2018-12-05 ENCOUNTER — Other Ambulatory Visit: Payer: Self-pay

## 2018-12-05 ENCOUNTER — Encounter: Payer: Self-pay | Admitting: Certified Nurse Midwife

## 2018-12-05 ENCOUNTER — Ambulatory Visit (INDEPENDENT_AMBULATORY_CARE_PROVIDER_SITE_OTHER): Payer: Medicaid Other | Admitting: Certified Nurse Midwife

## 2018-12-05 ENCOUNTER — Ambulatory Visit (INDEPENDENT_AMBULATORY_CARE_PROVIDER_SITE_OTHER): Payer: Medicaid Other

## 2018-12-05 VITALS — BP 105/82 | HR 72 | Wt 290.3 lb

## 2018-12-05 DIAGNOSIS — Z3492 Encounter for supervision of normal pregnancy, unspecified, second trimester: Secondary | ICD-10-CM

## 2018-12-05 DIAGNOSIS — O26892 Other specified pregnancy related conditions, second trimester: Secondary | ICD-10-CM | POA: Insufficient documentation

## 2018-12-05 DIAGNOSIS — Z3689 Encounter for other specified antenatal screening: Secondary | ICD-10-CM

## 2018-12-05 DIAGNOSIS — Z3A19 19 weeks gestation of pregnancy: Secondary | ICD-10-CM

## 2018-12-05 DIAGNOSIS — Z3481 Encounter for supervision of other normal pregnancy, first trimester: Secondary | ICD-10-CM | POA: Diagnosis not present

## 2018-12-05 DIAGNOSIS — Z130 Encounter for screening for diseases of the blood and blood-forming organs and certain disorders involving the immune mechanism: Secondary | ICD-10-CM

## 2018-12-05 DIAGNOSIS — R102 Pelvic and perineal pain: Secondary | ICD-10-CM

## 2018-12-05 DIAGNOSIS — Z113 Encounter for screening for infections with a predominantly sexual mode of transmission: Secondary | ICD-10-CM

## 2018-12-05 DIAGNOSIS — Z131 Encounter for screening for diabetes mellitus: Secondary | ICD-10-CM

## 2018-12-05 LAB — POCT URINALYSIS DIPSTICK OB
Bilirubin, UA: NEGATIVE
Blood, UA: NEGATIVE
Glucose, UA: NEGATIVE
Ketones, UA: NEGATIVE
Leukocytes, UA: NEGATIVE
Nitrite, UA: NEGATIVE
Spec Grav, UA: 1.02 (ref 1.010–1.025)
Urobilinogen, UA: 0.2 E.U./dL
pH, UA: 6 (ref 5.0–8.0)

## 2018-12-05 NOTE — Progress Notes (Signed)
ROB-Reports intermittent pelvic pain and cramping. Discussed home treatment measures including use of abdominal support. Anatomy scan today NORMAL, INCOMPLETE; findings reviewed with patient, verbalized understanding. Anesthesia consult completed 07/21, follow up between 34-35 weeks. Anticipatory guidance regarding course of prenatal care. Reviewed red flag symptoms and when to call. RTC x 3-4 weeks for follow up anatomy scan and ROB or sooner if needed.   ULTRASOUND REPORT  Location: [facility] Date of Service: 12/05/2018   Indications:Anatomy Ultrasound Findings:  Singleton intrauterine pregnancy is visualized with FHR at 139 BPM. Biometrics give an (U/S) Gestational age of [redacted]w[redacted]d and an (U/S) EDD of 04/26/2019; this correlates with the clinically established Estimated Date of Delivery: 04/25/19  Fetal presentation is Variable.  EFW: 297 g ( 10 oz). Placenta: anterior. Grade: 1 AFI: subjectively normal.  Anatomic survey is incomplete for Spine,Profile,RVOT and normal; Gender - female.    Right Ovary is normal in appearance. Left Ovary is normal appearance. Survey of the adnexa demonstrates no adnexal masses. There is no free peritoneal fluid in the cul de sac.  Impression: 1. [redacted]w[redacted]d Viable Singleton Intrauterine pregnancy by U/S. 2. (U/S) EDD is consistent with Clinically established Estimated Date of Delivery: 04/25/19 . 3. Incomplete for Spine,Profile,RVOT  Recommendations: 1.Clinical correlation with the patient's History and Physical Exam. 2. 3 week follow up do to body habitus.

## 2018-12-05 NOTE — Progress Notes (Signed)
ROB-Patient c/o intermittent pelvic pain and cramping "through out whole pregnancy".

## 2018-12-05 NOTE — Patient Instructions (Signed)
Exercise During Pregnancy Exercise is an important part of being healthy for people of all ages. Exercise improves the function of your heart and lungs and helps you maintain strength, flexibility, and a healthy body weight. Exercise also boosts energy levels and elevates mood. Most women should exercise regularly during pregnancy. In rare cases, women with certain medical conditions or complications may be asked to limit or avoid exercise during pregnancy. How does this affect me? Along with maintaining general strength and flexibility, exercising during pregnancy can help:  Keep strength in muscles that are used during labor and childbirth.  Decrease low back pain.  Reduce symptoms of depression.  Control weight gain during pregnancy.  Reduce the risk of needing insulin if you develop diabetes during pregnancy.  Decrease the risk of cesarean delivery.  Speed up your recovery after giving birth. How does this affect my baby? Exercise can help you have a healthy pregnancy. Exercise does not cause premature birth. It will not cause your baby to weigh less at birth. What exercises can I do? Many exercises are safe for you to do during pregnancy. Do a variety of exercises that safely increase your heart and breathing rates and help you build and maintain muscle strength. Do exercises exactly as told by your health care provider. You may do these exercises:  Walking or hiking.  Swimming.  Water aerobics.  Riding a stationary bike.  Strength training.  Modified yoga or Pilates. Tell your instructor that you are pregnant. Avoid overstretching, and avoid lying on your back for long periods of time.  Running or jogging. Only choose this type of exercise if you: ? Ran or jogged regularly before your pregnancy. ? Can run or jog and still talk in complete sentences. What exercises should I avoid? Depending on your level of fitness and whether you exercised regularly before your  pregnancy, you may be told to limit high-intensity exercise. You can tell that you are exercising at a high intensity if you are breathing much harder and faster and cannot hold a conversation while exercising. You must avoid:  Contact sports.  Activities that put you at risk for falling on or being hit in the belly, such as downhill skiing, water skiing, surfing, rock climbing, cycling, gymnastics, and horseback riding.  Scuba diving.  Skydiving.  Yoga or Pilates in a room that is heated to high temperatures.  Jogging or running, unless you ran or jogged regularly before your pregnancy. While jogging or running, you should always be able to talk in full sentences. Do not run or jog so fast that you are unable to have a conversation.  Do not exercise at more than 6,000 feet above sea level (high elevation) if you are not used to exercising at high elevation. How do I exercise in a safe way?   Avoid overheating. Do not exercise in very high temperatures.  Wear loose-fitting, breathable clothes.  Avoid dehydration. Drink enough water before, during, and after exercise to keep your urine pale yellow.  Avoid overstretching. Because of hormone changes during pregnancy, it is easy to overstretch muscles, tendons, and ligaments during pregnancy.  Start slowly and ask your health care provider to recommend the types of exercise that are safe for you.  Do not exercise to lose weight. Follow these instructions at home:  Exercise on most days or all days of the week. Try to exercise for 30 minutes a day, 5 days a week, unless your health care provider tells you not to.  If  you actively exercised before your pregnancy and you are healthy, your health care provider may tell you to continue to do moderate to high-intensity exercise.  If you are just starting to exercise or did not exercise much before your pregnancy, your health care provider may tell you to do low to moderate-intensity  exercise. Questions to ask your health care provider  Is exercise safe for me?  What are signs that I should stop exercising?  Does my health condition mean that I should not exercise during pregnancy?  When should I avoid exercising during pregnancy? Stop exercising and contact a health care provider if: You have any unusual symptoms, such as:  Mild contractions of the uterus or cramps in the abdomen.  Dizziness that does not go away when you rest. Stop exercising and get help right away if: You have any unusual symptoms, such as:  Sudden, severe pain in your low back or your belly.  Mild contractions of the uterus or cramps in the abdomen that do not improve with rest and drinking fluids.  Chest pain.  Bleeding or fluid leaking from your vagina.  Shortness of breath. These symptoms may represent a serious problem that is an emergency. Do not wait to see if the symptoms will go away. Get medical help right away. Call your local emergency services (911 in the [place] Do not drive yourself to the hospital. Summary  Most women should exercise regularly throughout pregnancy. In rare cases, women with certain medical conditions or complications may be asked to limit or avoid exercise during pregnancy.  Do not exercise to lose weight during pregnancy.  Your health care provider will tell you what level of physical activity is right for you.  Stop exercising and contact a health care provider if you have mild contractions of the uterus or cramps in the abdomen. Get help right away if these contractions or cramps do not improve with rest and drinking fluids.  Stop exercising and get help right away if you have sudden, severe pain in your low back or belly, chest pain, shortness of breath, or bleeding or leaking of fluid from your vagina. This information is not intended to replace advice given to you by your health care provider. Make sure you discuss any questions you have with your  health care provider. Document Released: 04/26/2005 Document Revised: 08/17/2018 Document Reviewed: 05/31/2018 Elsevier Patient Education  2020 [employer] Inc[employer] Round Ligament Pain  The round ligament is a cord of muscle and tissue that helps support the uterus. It can become a source of pain during pregnancy if it becomes stretched or twisted as the baby grows. The pain usually begins in the second trimester (13-28 weeks) of pregnancy, and it can come and go until the baby is delivered. It is not a serious problem, and it does not cause harm to the baby. Round ligament pain is usually a short, sharp, and pinching pain, but it can also be a dull, lingering, and aching pain. The pain is felt in the lower side of the abdomen or in the groin. It usually starts deep in the groin and moves up to the outside of the hip area. The pain may occur when you:  Suddenly change position, such as quickly going from a sitting to standing position.  Roll over in bed.  Cough or sneeze.  Do physical activity. Follow these instructions at home:   Watch your condition for any changes.  When the pain starts, relax. Then try any of these  methods to help with the pain: ? Sitting down. ? Flexing your knees up to your abdomen. ? Lying on your side with one pillow under your abdomen and another pillow between your legs. ? Sitting in a warm bath for 15-20 minutes or until the pain goes away.  Take over-the-counter and prescription medicines only as told by your health care provider.  Move slowly when you sit down or stand up.  Avoid long walks if they cause pain.  Stop or reduce your physical activities if they cause pain.  Keep all follow-up visits as told by your health care provider. This is important. Contact a health care provider if:  Your pain does not go away with treatment.  You feel pain in your back that you did not have before.  Your medicine is not helping. Get help right away if:  You  have a fever or chills.  You develop uterine contractions.  You have vaginal bleeding.  You have nausea or vomiting.  You have diarrhea.  You have pain when you urinate. Summary  Round ligament pain is felt in the lower abdomen or groin. It is usually a short, sharp, and pinching pain. It can also be a dull, lingering, and aching pain.  This pain usually begins in the second trimester (13-28 weeks). It occurs because the uterus is stretching with the growing baby, and it is not harmful to the baby.  You may notice the pain when you suddenly change position, when you cough or sneeze, or during physical activity.  Relaxing, flexing your knees to your abdomen, lying on one side, or taking a warm bath may help to get rid of the pain.  Get help from your health care provider if the pain does not go away or if you have vaginal bleeding, nausea, vomiting, diarrhea, or painful urination. This information is not intended to replace advice given to you by your health care provider. Make sure you discuss any questions you have with your health care provider. Document Released: 02/03/2008 Document Revised: 10/12/2017 Document Reviewed: 10/12/2017 Elsevier Patient Education  2020 Elsevier Inc. Back Pain in Pregnancy Back pain during pregnancy is common. Back pain may be caused by several factors that are related to changes during your pregnancy. Follow these instructions at home: Managing pain, stiffness, and swelling      If directed, for sudden (acute) back pain, put ice on the painful area. ? Put ice in a plastic bag. ? Place a towel between your skin and the bag. ? Leave the ice on for 20 minutes, 2-3 times per day.  If directed, apply heat to the affected area before you exercise. Use the heat source that your health care provider recommends, such as a moist heat pack or a heating pad. ? Place a towel between your skin and the heat source. ? Leave the heat on for 20-30  minutes. ? Remove the heat if your skin turns bright red. This is especially important if you are unable to feel pain, heat, or cold. You may have a greater risk of getting burned.  If directed, massage the affected area. Activity  Exercise as told by your health care provider. Gentle exercise is the best way to prevent or manage back pain.  Listen to your body when lifting. If lifting hurts, ask for help or bend your knees. This uses your leg muscles instead of your back muscles.  Squat down when picking up something from the floor. Do not bend over.  Only  use bed rest for short periods as told by your health care provider. Bed rest should only be used for the most severe episodes of back pain. Standing, sitting, and lying down  Do not stand in one place for long periods of time.  Use good posture when sitting. Make sure your head rests over your shoulders and is not hanging forward. Use a pillow on your lower back if necessary.  Try sleeping on your side, preferably the left side, with a pregnancy support pillow or 1-2 regular pillows between your legs. ? If you have back pain after a night's rest, your bed may be too soft. ? A firm mattress may provide more support for your back during pregnancy. General instructions  Do not wear high heels.  Eat a healthy diet. Try to gain weight within your health care provider's recommendations.  Use a maternity girdle, elastic sling, or back brace as told by your health care provider.  Take over-the-counter and prescription medicines only as told by your health care provider.  Work with a physical therapist or massage therapist to find ways to manage back pain. Acupuncture or massage therapy may be helpful.  Keep all follow-up visits as told by your health care provider. This is important. Contact a health care provider if:  Your back pain interferes with your daily activities.  You have increasing pain in other parts of your body. Get  help right away if:  You develop numbness, tingling, weakness, or problems with the use of your arms or legs.  You develop severe back pain that is not controlled with medicine.  You have a change in bowel or bladder control.  You develop shortness of breath, dizziness, or you faint.  You develop nausea, vomiting, or sweating.  You have back pain that is a rhythmic, cramping pain similar to labor pains. Labor pain is usually 1-2 minutes apart, lasts for about 1 minute, and involves a bearing down feeling or pressure in your pelvis.  You have back pain and your water breaks or you have vaginal bleeding.  You have back pain or numbness that travels down your leg.  Your back pain developed after you fell.  You develop pain on one side of your back.  You see blood in your urine.  You develop skin blisters in the area of your back pain. Summary  Back pain may be caused by several factors that are related to changes during your pregnancy.  Follow instructions as told by your health care provider for managing pain, stiffness, and swelling.  Exercise as told by your health care provider. Gentle exercise is the best way to prevent or manage back pain.  Take over-the-counter and prescription medicines only as told by your health care provider.  Keep all follow-up visits as told by your health care provider. This is important. This information is not intended to replace advice given to you by your health care provider. Make sure you discuss any questions you have with your health care provider. Document Released: 08/04/2005 Document Revised: 08/15/2018 Document Reviewed: 10/12/2017 Elsevier Patient Education  [place]. Abdominal Pain During Pregnancy  Belly (abdominal) pain is common during pregnancy. There are many possible causes. Most of the time, it is not a serious problem. Other times, it can be a sign that something is wrong with the pregnancy. Always tell your doctor  if you have belly pain. Follow these instructions at home:  Do not have sex or put anything in your vagina until  your pain goes away completely.  Get plenty of rest until your pain gets better.  Drink enough fluid to keep your pee (urine) pale yellow.  Take over-the-counter and prescription medicines only as told by your doctor.  Keep all follow-up visits as told by your doctor. This is important. Contact a doctor if:  Your pain continues or gets worse after resting.  You have lower belly pain that: ? Comes and goes at regular times. ? Spreads to your back. ? Feels like menstrual cramps.  You have pain or burning when you pee (urinate). Get help right away if:  You have a fever or chills.  You have vaginal bleeding.  You are leaking fluid from your vagina.  You are passing tissue from your vagina.  You throw up (vomit) for more than 24 hours.  You have watery poop (diarrhea) for more than 24 hours.  Your baby is moving less than usual.  You feel very weak or faint.  You have shortness of breath.  You have very bad pain in your upper belly. Summary  Belly (abdominal) pain is common during pregnancy. There are many possible causes.  If you have belly pain during pregnancy, tell your doctor right away.  Keep all follow-up visits as told by your doctor. This is important. This information is not intended to replace advice given to you by your health care provider. Make sure you discuss any questions you have with your health care provider. Document Released: 04/14/2009 Document Revised: 08/14/2018 Document Reviewed: 07/29/2016 Elsevier Patient Education  2020 [employer]

## 2018-12-12 ENCOUNTER — Other Ambulatory Visit: Payer: Self-pay | Admitting: Obstetrics and Gynecology

## 2018-12-26 ENCOUNTER — Other Ambulatory Visit: Payer: Self-pay

## 2018-12-26 ENCOUNTER — Encounter: Payer: Self-pay | Admitting: Certified Nurse Midwife

## 2018-12-26 ENCOUNTER — Ambulatory Visit (INDEPENDENT_AMBULATORY_CARE_PROVIDER_SITE_OTHER): Payer: Medicaid Other

## 2018-12-26 ENCOUNTER — Ambulatory Visit (INDEPENDENT_AMBULATORY_CARE_PROVIDER_SITE_OTHER): Payer: Medicaid Other | Admitting: Certified Nurse Midwife

## 2018-12-26 VITALS — BP 93/58 | HR 89 | Wt 291.0 lb

## 2018-12-26 DIAGNOSIS — Z3492 Encounter for supervision of normal pregnancy, unspecified, second trimester: Secondary | ICD-10-CM | POA: Diagnosis not present

## 2018-12-26 DIAGNOSIS — Z3689 Encounter for other specified antenatal screening: Secondary | ICD-10-CM

## 2018-12-26 LAB — POCT URINALYSIS DIPSTICK OB
Bilirubin, UA: NEGATIVE
Blood, UA: NEGATIVE
Glucose, UA: NEGATIVE
Ketones, UA: NEGATIVE
Leukocytes, UA: NEGATIVE
Nitrite, UA: NEGATIVE
POC,PROTEIN,UA: NEGATIVE
Spec Grav, UA: <=1.005 — AB (ref 1.010–1.025)
Urobilinogen, UA: 0.2 E.U./dL
pH, UA: 5 (ref 5.0–8.0)

## 2018-12-26 NOTE — Addendum Note (Signed)
Addended by: Raliegh Ip on: 12/26/2018 02:29 PM   Modules accepted: Orders

## 2018-12-26 NOTE — Patient Instructions (Signed)

## 2018-12-26 NOTE — Progress Notes (Addendum)
ROB, doing well. U/s today to complete anatomy scan ( see below). Discussed transfer to MD care due to BMI. Follow up 4 1/2 wks for glucose screening. Will do 3 hr due to having had to do the 3 hr previously due abnormal 1 hr. Discuss fasting prior to testing. She verbalizes understanding.    Patient Name: Kinley Dozier DOB: 03/20/1991 MRN: [id number] ULTRASOUND REPORT  Location: [facility] Date of Service: 12/26/2018   Indications: Anatomy follow up ultrasound Findings:  Nelda [place]se[place] intrauterine pregnancy is visualized with FHR at 136 BPM.  Fetal presentation is Breech.  Placenta: anterior. Grade: 1 AFI: subjectively normal.  Anatomic survey is complete.   There is no free peritoneal fluid in the cul de sac.  Impression: 1. [redacted]w[redacted]d Viable Singleton Intrauterine pregnancy previously established criteria. 2. Normal Anatomy Scan is now complete  Recommendations: 1.Clinical correlation with the patient's History and Physical Exam.   Jenine M. Albertine Grates    RDMS

## 2019-01-04 ENCOUNTER — Telehealth: Payer: Self-pay | Admitting: Obstetrics and Gynecology

## 2019-01-04 NOTE — Telephone Encounter (Signed)
The patient called and stated that she has a UTI and is wanting to know if a prescription can be sent into her pharmacy. Pt is okay with doing a urine drop off as well. Pt is at work please leave a detailed message. Please advise.

## 2019-01-05 NOTE — Telephone Encounter (Signed)
Pt called and informed via vm to try AZO tablets, increase fluid intake and try cranberry juice. Pt was advised that if her sxs do not improved by Monday to call the office to make an appointment to drop off urine or be seen by MS.

## 2019-01-08 ENCOUNTER — Other Ambulatory Visit: Payer: Self-pay

## 2019-01-08 ENCOUNTER — Telehealth: Payer: Self-pay

## 2019-01-08 NOTE — Telephone Encounter (Signed)
Mychart messages sent- patient will stop by office and leave a urine specimen.

## 2019-01-09 ENCOUNTER — Other Ambulatory Visit: Payer: Self-pay

## 2019-01-22 NOTE — Progress Notes (Deleted)
ROB-Pt present for routine prenatal care. Pt  

## 2019-01-23 ENCOUNTER — Other Ambulatory Visit: Payer: Medicaid Other

## 2019-01-23 ENCOUNTER — Encounter: Payer: Medicaid Other | Admitting: Obstetrics and Gynecology

## 2019-01-26 ENCOUNTER — Other Ambulatory Visit: Payer: Self-pay

## 2019-01-26 ENCOUNTER — Telehealth: Payer: Self-pay | Admitting: Obstetrics and Gynecology

## 2019-01-26 MED ORDER — BLOOD GLUCOSE MONITOR KIT: PACK | 0 refills | Status: DC

## 2019-01-26 NOTE — Telephone Encounter (Signed)
Contacted patient regarding her questions about keeping blood glucose log for 7 days.  Answered questions to patient's satisfaction.  Patient has a f/u appointment on 02/06/19 to review log.    Sara Maid, MD [facility]

## 2019-01-26 NOTE — Telephone Encounter (Signed)
The patient called and stated that she needs a call back from her nurse in regards to her needing clarification about not doing her 3 hr gt and testing her blood sugar at home for 7 days per [facility]. Please advise.

## 2019-02-06 ENCOUNTER — Other Ambulatory Visit: Payer: Self-pay

## 2019-02-06 ENCOUNTER — Other Ambulatory Visit: Payer: Medicaid Other

## 2019-02-06 ENCOUNTER — Ambulatory Visit (INDEPENDENT_AMBULATORY_CARE_PROVIDER_SITE_OTHER): Payer: Medicaid Other | Admitting: Obstetrics and Gynecology

## 2019-02-06 ENCOUNTER — Encounter: Payer: Self-pay | Admitting: Obstetrics and Gynecology

## 2019-02-06 VITALS — BP 98/66 | HR 94 | Ht 64.0 in | Wt 288.3 lb

## 2019-02-06 DIAGNOSIS — Z3492 Encounter for supervision of normal pregnancy, unspecified, second trimester: Secondary | ICD-10-CM

## 2019-02-06 DIAGNOSIS — Z3A28 28 weeks gestation of pregnancy: Secondary | ICD-10-CM

## 2019-02-06 DIAGNOSIS — O99213 Obesity complicating pregnancy, third trimester: Secondary | ICD-10-CM

## 2019-02-06 LAB — POCT URINALYSIS DIPSTICK OB
Bilirubin, UA: NEGATIVE
Blood, UA: NEGATIVE
Glucose, UA: NEGATIVE
Ketones, UA: NEGATIVE
Leukocytes, UA: NEGATIVE
Nitrite, UA: NEGATIVE
POC,PROTEIN,UA: NEGATIVE
Spec Grav, UA: 1.01 (ref 1.010–1.025)
Urobilinogen, UA: 0.2 E.U./dL
pH, UA: 6.5 (ref 5.0–8.0)

## 2019-02-06 NOTE — Progress Notes (Signed)
Patient comes in today for [facility] visit. She is having pain and pressure in vaginal area.

## 2019-02-06 NOTE — Progress Notes (Signed)
ROB: Patient has been keeping sugars 4 times per day in lieu of repeat 3-hour GTT.  Some fastings elevated some postprandials elevated.  Patient possibly a gestational diabetic but unable to tell from 1 week of sugars.  Asked her to do 2 more weeks for more information and she declined.  She states she would rather do the 3-hour GTT and "know for sure".  She will schedule a 3-hour GTT.  She wants her Tdap during that visit as well. (Refused Tdap today).   plan Anes consult 35 weeks for BMI.

## 2019-02-09 ENCOUNTER — Observation Stay
Admission: EM | Admit: 2019-02-09 | Discharge: 2019-02-09 | Disposition: A | Discharge status: Home / Self Care | Payer: Medicaid Other | Attending: Obstetrics and Gynecology | Admitting: Obstetrics and Gynecology

## 2019-02-09 ENCOUNTER — Telehealth: Payer: Self-pay | Admitting: Obstetrics and Gynecology

## 2019-02-09 ENCOUNTER — Encounter: Payer: Self-pay | Admitting: *Deleted

## 2019-02-09 ENCOUNTER — Other Ambulatory Visit: Payer: Self-pay

## 2019-02-09 ENCOUNTER — Other Ambulatory Visit: Payer: Medicaid Other

## 2019-02-09 DIAGNOSIS — Z3A29 29 weeks gestation of pregnancy: Secondary | ICD-10-CM | POA: Diagnosis not present

## 2019-02-09 DIAGNOSIS — O26893 Other specified pregnancy related conditions, third trimester: Principal | ICD-10-CM | POA: Insufficient documentation

## 2019-02-09 DIAGNOSIS — R1030 Lower abdominal pain, unspecified: Secondary | ICD-10-CM | POA: Insufficient documentation

## 2019-02-09 DIAGNOSIS — R109 Unspecified abdominal pain: Secondary | ICD-10-CM | POA: Diagnosis present

## 2019-02-09 LAB — URINALYSIS, ROUTINE W REFLEX MICROSCOPIC
Bilirubin Urine: NEGATIVE
Glucose, UA: NEGATIVE mg/dL
Hgb urine dipstick: NEGATIVE
Ketones, ur: 5 mg/dL — AB
Leukocytes,Ua: NEGATIVE
Nitrite: NEGATIVE
Protein, ur: NEGATIVE mg/dL
Specific Gravity, Urine: 1.023 (ref 1.005–1.030)
pH: 5 (ref 5.0–8.0)

## 2019-02-09 MED ORDER — ACETAMINOPHEN 500 MG PO TABS
1000.0000 mg | ORAL_TABLET | Freq: Four times a day (QID) | ORAL | Status: DC | PRN
  Administered 2019-02-09: 1000 mg via ORAL
  Filled 2019-02-09: qty 2

## 2019-02-09 NOTE — Telephone Encounter (Signed)
I let Dr. Marcelline Mates know that patient was going to ED.

## 2019-02-09 NOTE — Telephone Encounter (Signed)
Pt called and stated that she is on her way to ED pt is experiencing severe abdominal pain. Please advise.

## 2019-02-09 NOTE — OB Triage Note (Signed)
Lower abdominal pain since around noon today coming and going about every 15 minutes. Monitors applied, assessing. Dineen Kid

## 2019-02-09 NOTE — Final Progress Note (Signed)
L&D OB Triage Note  Sara Boyd is a 28 y.o. [id number] female at [redacted]w[redacted]d, EDD Estimated Date of Delivery: 04/25/19 who presented to triage for complaints of lower abdominal pain for several hours. Describes the pain as sharp, intermittent, and feeling like she needs to have a BM when the pain is subsiding.  Patient reports that she had a BM this morning, however has been several days prior to this that had not gone.  Is supposed to be taking Colace and/or Miralax but notes she does not take this as prescribed.  Of note, she does have a history of recurrent UTI's in the past. She was evaluated by the nurses with no significant acute findings. Patient with only 1 episode of pain during several hours of triage. Vital signs stable. An NST was performed and has been reviewed by MD.    NST INTERPRETATION: Indications: rule out uterine contractions  Mode: External Baseline Rate (A): 135 bpm Variability: Moderate Accelerations: 15 x 15 Decelerations: Variable (shallow), occasional     Contraction Frequency (min): none  Impression: reactive   Labs:  Results for orders placed or performed during the hospital encounter of 02/09/19  Urinalysis, Routine w reflex microscopic  Result Value Ref Range   Color, Urine YELLOW (A) YELLOW   APPearance HAZY (A) CLEAR   Specific Gravity, Urine 1.023 1.005 - 1.030   pH 5.0 5.0 - 8.0   Glucose, UA NEGATIVE NEGATIVE mg/dL   Hgb urine dipstick NEGATIVE NEGATIVE   Bilirubin Urine NEGATIVE NEGATIVE   Ketones, ur 5 (A) NEGATIVE mg/dL   Protein, ur NEGATIVE NEGATIVE mg/dL   Nitrite NEGATIVE NEGATIVE   Leukocytes,Ua NEGATIVE NEGATIVE    Plan: NST performed was reviewed and was found to be reactive. She was discharged home with bleeding/labor precautions.  She was encouraged to take her Colace/Miralax as prescribed and increase hydration. Continue routine prenatal care. Follow up with OB/GYN as previously scheduled.     Rubie Maid, MD  [facility]

## 2019-02-12 ENCOUNTER — Other Ambulatory Visit: Payer: Medicaid Other

## 2019-02-14 ENCOUNTER — Other Ambulatory Visit: Payer: Self-pay

## 2019-02-14 ENCOUNTER — Other Ambulatory Visit: Payer: Medicaid Other

## 2019-02-14 DIAGNOSIS — Z3492 Encounter for supervision of normal pregnancy, unspecified, second trimester: Secondary | ICD-10-CM

## 2019-02-14 DIAGNOSIS — Z130 Encounter for screening for diseases of the blood and blood-forming organs and certain disorders involving the immune mechanism: Secondary | ICD-10-CM

## 2019-02-14 DIAGNOSIS — Z113 Encounter for screening for infections with a predominantly sexual mode of transmission: Secondary | ICD-10-CM

## 2019-02-15 LAB — CBC
Hematocrit: 35 % (ref 34.0–46.6)
Hemoglobin: 11.3 g/dL (ref 11.1–15.9)
MCH: 29 pg (ref 26.6–33.0)
MCHC: 32.3 g/dL (ref 31.5–35.7)
MCV: 90 fL (ref 79–97)
Platelets: 208 10*3/uL (ref 150–450)
RBC: 3.89 x10E6/uL (ref 3.77–5.28)
RDW: 13.6 % (ref 11.7–15.4)
WBC: 6.8 10*3/uL (ref 3.4–10.8)

## 2019-02-15 LAB — GESTATIONAL GLUCOSE TOLERANCE
Glucose, Fasting: 87 mg/dL (ref 65–94)
Glucose, GTT - 1 Hour: 179 mg/dL (ref 65–179)
Glucose, GTT - 2 Hour: 149 mg/dL (ref 65–154)
Glucose, GTT - 3 Hour: 129 mg/dL (ref 65–139)

## 2019-02-15 LAB — RPR: RPR Ser Ql: NONREACTIVE

## 2019-02-26 NOTE — Progress Notes (Signed)
ROB-Pt is present for routine prenatal care.  TDAP vaccine completed. Pt declined flu vaccine. Pt stated having pain in pelvic area and pressure in the vaginal/rectal area. Pt is concerned that her baby is breech.

## 2019-02-26 NOTE — Patient Instructions (Addendum)
Third Trimester of Pregnancy  The third trimester is from week 28 through week 40 (months 7 through 9). This trimester is when your unborn baby (fetus) is growing very fast. At the end of the ninth month, the unborn baby is about 20 inches in length. It weighs about 6-10 pounds. Follow these instructions at home: Medicines  Take over-the-counter and prescription medicines only as told by your doctor. Some medicines are safe and some medicines are not safe during pregnancy.  Take a prenatal vitamin that contains at least 600 micrograms (mcg) of folic acid.  If you have trouble pooping (constipation), take medicine that will make your stool soft (stool softener) if your doctor approves. Eating and drinking   Eat regular, healthy meals.  Avoid raw meat and uncooked cheese.  If you get low calcium from the food you eat, talk to your doctor about taking a daily calcium supplement.  Eat four or five small meals rather than three large meals a day.  Avoid foods that are high in fat and sugars, such as fried and sweet foods.  To prevent constipation: ? Eat foods that are high in fiber, like fresh fruits and vegetables, whole grains, and beans. ? Drink enough fluids to keep your pee (urine) clear or pale yellow. Activity  Exercise only as told by your doctor. Stop exercising if you start to have cramps.  Avoid heavy lifting, wear low heels, and sit up straight.  Do not exercise if it is too hot, too humid, or if you are in a place of great height (high altitude).  You may continue to have sex unless your doctor tells you not to. Relieving pain and discomfort  Wear a good support bra if your breasts are tender.  Take frequent breaks and rest with your legs raised if you have leg cramps or low back pain.  Take warm water baths (sitz baths) to soothe pain or discomfort caused by hemorrhoids. Use hemorrhoid cream if your doctor approves.  If you develop puffy, bulging veins (varicose  veins) in your legs: ? Wear support hose or compression stockings as told by your doctor. ? Raise (elevate) your feet for 15 minutes, 3-4 times a day. ? Limit salt in your food. Safety  Wear your seat belt when driving.  Make a list of emergency phone numbers, including numbers for family, friends, the hospital, and police and fire departments. Preparing for your baby's arrival To prepare for the arrival of your baby:  Take prenatal classes.  Practice driving to the hospital.  Visit the hospital and tour the maternity area.  Talk to your work about taking leave once the baby comes.  Pack your hospital bag.  Prepare the baby's room.  Go to your doctor visits.  Buy a rear-facing car seat. Learn how to install it in your car. General instructions  Do not use hot tubs, steam rooms, or saunas.  Do not use any products that contain nicotine or tobacco, such as cigarettes and e-cigarettes. If you need help quitting, ask your doctor.  Do not drink alcohol.  Do not douche or use tampons or scented sanitary pads.  Do not cross your legs for long periods of time.  Do not travel for long distances unless you must. Only do so if your doctor says it is okay.  Visit your dentist if you have not gone during your pregnancy. Use a soft toothbrush to brush your teeth. Be gentle when you floss.  Avoid cat litter boxes and soil  used by cats. These carry germs that can cause birth defects in the baby and can cause a loss of your baby (miscarriage) or stillbirth.  Keep all your prenatal visits as told by your doctor. This is important. Contact a doctor if:  You are not sure if you are in labor or if your water has broken.  You are dizzy.  You have mild cramps or pressure in your lower belly.  You have a nagging pain in your belly area.  You continue to feel sick to your stomach, you throw up, or you have watery poop.  You have bad smelling fluid coming from your vagina.  You have  pain when you pee. Get help right away if:  You have a fever.  You are leaking fluid from your vagina.  You are spotting or bleeding from your vagina.  You have severe belly cramps or pain.  You lose or gain weight quickly.  You have trouble catching your breath and have chest pain.  You notice sudden or extreme puffiness (swelling) of your face, hands, ankles, feet, or legs.  You have not felt the baby move in over an hour.  You have severe headaches that do not go away with medicine.  You have trouble seeing.  You are leaking, or you are having a gush of fluid, from your vagina before you are 37 weeks.  You have regular belly spasms (contractions) before you are 37 weeks. Summary  The third trimester is from week 28 through week 40 (months 7 through 9). This time is when your unborn baby is growing very fast.  Follow your doctor's advice about medicine, food, and activity.  Get ready for the arrival of your baby by taking prenatal classes, getting all the baby items ready, preparing the baby's room, and visiting your doctor to be checked.  Get help right away if you are bleeding from your vagina, or you have chest pain and trouble catching your breath, or if you have not felt your baby move in over an hour. This information is not intended to replace advice given to you by your health care provider. Make sure you discuss any questions you have with your health care provider. Document Released: 07/21/2009 Document Revised: 08/17/2018 Document Reviewed: 06/01/2016 Elsevier Patient Education  [employer].     https://www.cdc.gov/vaccines/hcp/vis/vis-statements/tdap.pdf">  Tdap Vaccine (Tetanus, Diphtheria and Pertussis): What You Need to Know 1. Why get vaccinated? Tetanus, diphtheria and pertussis are very serious diseases. Tdap vaccine can protect [place] from these diseases. And, Tdap vaccine given to pregnant women can protect newborn babies against  pertussis.Marland Kitchen TETANUS (Lockjaw) is rare in the [place] today. It causes painful muscle tightening and stiffness, usually all over the body. It can lead to tightening of muscles in the head and neck so you can't open your mouth, swallow, or sometimes even breathe. Tetanus kills about 1 out of 10 people who are infected even after receiving the best medical care. DIPHTHERIA is also rare in the [place] today. It can cause a thick coating to form in the back of the throat. It can lead to breathing problems, heart failure, paralysis, and death. PERTUSSIS (Whooping Cough) causes severe coughing spells, which can cause difficulty breathing, vomiting and disturbed sleep. It can also lead to weight loss, incontinence, and rib fractures. Up to 2 in 100 adolescents and 5 in 100 adults with pertussis are hospitalized or have complications, which could include pneumonia or death. These diseases are caused by bacteria.  Diphtheria and pertussis are spread from person to person through secretions from coughing or sneezing. Tetanus enters the body through cuts, scratches, or wounds. Before vaccines, as many as 200,000 cases of diphtheria, 200,000 cases of pertussis, and hundreds of cases of tetanus, were reported in the [place] each year. Since vaccination began, reports of cases for tetanus and diphtheria have dropped by about 99% and for pertussis by about 80%. 2. Tdap vaccine Tdap vaccine can protect adolescents and adults from tetanus, diphtheria, and pertussis. One dose of Tdap is routinely given at age 76 or 47. People who did not get Tdap at that age should get it as soon as possible. Tdap is especially important for healthcare professionals and anyone having close contact with a baby younger than 12 months. Pregnant women should get a dose of Tdap during every pregnancy, to protect the newborn from pertussis. Infants are most at risk for severe, life-threatening complications from  pertussis. Another vaccine, called Td, protects against tetanus and diphtheria, but not pertussis. A Td booster should be given every 10 years. Tdap may be given as one of these boosters if you have never gotten Tdap before. Tdap may also be given after a severe cut or burn to prevent tetanus infection. Your doctor or the person giving you the vaccine can give you more information. Tdap may safely be given at the same time as other vaccines. 3. Some people should not get this vaccine A person who has ever had a life-threatening allergic reaction after a previous dose of any diphtheria, tetanus or pertussis containing vaccine, OR has a severe allergy to any part of this vaccine, should not get Tdap vaccine. Tell the person giving the vaccine about any severe allergies. Anyone who had coma or long repeated seizures within 7 days after a childhood dose of DTP or DTaP, or a previous dose of Tdap, should not get Tdap, unless a cause other than the vaccine was found. They can still get Td. Talk to your doctor if you: have seizures or another nervous system problem, had severe pain or swelling after any vaccine containing diphtheria, tetanus or pertussis, ever had a condition called Guillain-Barr Syndrome (GBS), aren't feeling well on the day the shot is scheduled. 4. Risks With any medicine, including vaccines, there is a chance of side effects. These are usually mild and go away on their own. Serious reactions are also possible but are rare. Most people who get Tdap vaccine do not have any problems with it. Mild problems following Tdap (Did not interfere with activities) Pain where the shot was given (about 3 in 4 adolescents or 2 in 3 adults) Redness or swelling where the shot was given (about 1 person in 5) Mild fever of at least 100.13F (up to about 1 in 25 adolescents or 1 in 100 adults) Headache (about 3 or 4 people in 10) Tiredness (about 1 person in 3 or 4) Nausea, vomiting, diarrhea,  stomach ache (up to 1 in 4 adolescents or 1 in 10 adults) Chills, sore joints (about 1 person in 10) Body aches (about 1 person in 3 or 4) Rash, swollen glands (uncommon) Moderate problems following Tdap (Interfered with activities, but did not require medical attention) Pain where the shot was given (up to 1 in 5 or 6) Redness or swelling where the shot was given (up to about 1 in 16 adolescents or 1 in 12 adults) Fever over 102F (about 1 in 100 adolescents or 1 in 250 adults) Headache (about  1 in 7 adolescents or 1 in 10 adults) Nausea, vomiting, diarrhea, stomach ache (up to 1 or 3 people in 100) Swelling of the entire arm where the shot was given (up to about 1 in 500). Severe problems following Tdap (Unable to perform usual activities; required medical attention) Swelling, severe pain, bleeding and redness in the arm where the shot was given (rare). Problems that could happen after any vaccine: People sometimes faint after a medical procedure, including vaccination. Sitting or lying down for about 15 minutes can help prevent fainting, and injuries caused by a fall. Tell your doctor if you feel dizzy, or have vision changes or ringing in the ears. Some people get severe pain in the shoulder and have difficulty moving the arm where a shot was given. This happens very rarely. Any medication can cause a severe allergic reaction. Such reactions from a vaccine are very rare, estimated at fewer than 1 in a million doses, and would happen within a few minutes to a few hours after the vaccination. As with any medicine, there is a very remote chance of a vaccine causing a serious injury or death. The safety of vaccines is always being monitored. For more information, visit: http://floyd.org/ 5. What if there is a serious problem? What should I look for? Look for anything that concerns you, such as signs of a severe allergic reaction, very high fever, or unusual behavior. Signs of a  severe allergic reaction can include hives, swelling of the face and throat, difficulty breathing, a fast heartbeat, dizziness, and weakness. These would usually start a few minutes to a few hours after the vaccination. What should I do? If you think it is a severe allergic reaction or other emergency that can't wait, call 9-1-1 or get the person to the nearest hospital. Otherwise, call your doctor. Afterward, the reaction should be reported to the Vaccine Adverse Event Reporting System (VAERS). Your doctor might file this report, or you can do it yourself through the VAERS web site at www.vaers.LAgents.no, or by calling 1-3107495294. VAERS does not give medical advice. 6. The National Vaccine Injury Compensation Program The [employer] Vaccine Injury Compensation Program (VICP) is a federal program that was created to compensate people who may have been injured by certain vaccines. Persons who believe they may have been injured by a vaccine can learn about the program and about filing a claim by calling 1-717-652-3899 or visiting the VICP website at SpiritualWord.at. There is a time limit to file a claim for compensation. 7. How can I learn more? Ask your doctor. He or she can give you the vaccine package insert or suggest other sources of information. Call your local or state health department. Contact the Centers for Disease Control and Prevention (CDC): Call 684-088-6554 (1-800-CDC-INFO) or Visit CDC's website at PicCapture.uy Vaccine Information Statement Tdap Vaccine (07/03/2013) This information is not intended to replace advice given to you by your health care provider. Make sure you discuss any questions you have with your health care provider. Document Released: 10/26/2011 Document Revised: 12/12/2017 Document Reviewed: 12/12/2017 Elsevier Interactive Patient Education  2020 [employer].     Breastfeeding  Choosing to breastfeed is one of the best decisions you  can make for yourself and your baby. A change in hormones during pregnancy causes your breasts to make breast milk in your milk-producing glands. Hormones prevent breast milk from being released before your baby is born. They also prompt milk flow after birth. Once breastfeeding has begun, thoughts of your baby,  as well as his or her sucking or crying, can stimulate the release of milk from your milk-producing glands. Benefits of breastfeeding Research shows that breastfeeding offers many health benefits for infants and mothers. It also offers a cost-free and convenient way to feed your baby. For your baby  Your first milk (colostrum) helps your baby's digestive system to function better.  Special cells in your milk (antibodies) help your baby to fight off infections.  Breastfed babies are less likely to develop asthma, allergies, obesity, or type 2 diabetes. They are also at lower risk for sudden infant death syndrome (SIDS).  Nutrients in breast milk are better able to meet your babys needs compared to infant formula.  Breast milk improves your baby's brain development. For you  Breastfeeding helps to create a very special bond between you and your baby.  Breastfeeding is convenient. Breast milk costs nothing and is always available at the correct temperature.  Breastfeeding helps to burn calories. It helps you to lose the weight that you gained during pregnancy.  Breastfeeding makes your uterus return faster to its size before pregnancy. It also slows bleeding (lochia) after you give birth.  Breastfeeding helps to lower your risk of developing type 2 diabetes, osteoporosis, rheumatoid arthritis, cardiovascular disease, and breast, ovarian, uterine, and endometrial cancer later in life. Breastfeeding basics Starting breastfeeding  Find a comfortable place to sit or lie down, with your neck and back well-supported.  Place a pillow or a rolled-up blanket under your baby to bring him or  her to the level of your breast (if you are seated). Nursing pillows are specially designed to help support your arms and your baby while you breastfeed.  Make sure that your baby's tummy (abdomen) is facing your abdomen.  Gently massage your breast. With your fingertips, massage from the outer edges of your breast inward toward the nipple. This encourages milk flow. If your milk flows slowly, you may need to continue this action during the feeding.  Support your breast with 4 fingers underneath and your thumb above your nipple (make the letter "C" with your hand). Make sure your fingers are well away from your nipple and your babys mouth.  Stroke your baby's lips gently with your finger or nipple.  When your baby's mouth is open wide enough, quickly bring your baby to your breast, placing your entire nipple and as much of the areola as possible into your baby's mouth. The areola is the colored area around your nipple. ? More areola should be visible above your baby's upper lip than below the lower lip. ? Your baby's lips should be opened and extended outward (flanged) to ensure an adequate, comfortable latch. ? Your baby's tongue should be between his or her lower gum and your breast.  Make sure that your baby's mouth is correctly positioned around your nipple (latched). Your baby's lips should create a seal on your breast and be turned out (everted).  It is common for your baby to suck about 2-3 minutes in order to start the flow of breast milk. Latching Teaching your baby how to latch onto your breast properly is very important. An improper latch can cause nipple pain, decreased milk supply, and poor weight gain in your baby. Also, if your baby is not latched onto your nipple properly, he or she may swallow some air during feeding. This can make your baby fussy. Burping your baby when you switch breasts during the feeding can help to get rid of the air.  However, teaching your baby to latch on  properly is still the best way to prevent fussiness from swallowing air while breastfeeding. Signs that your baby has successfully latched onto your nipple  Silent tugging or silent sucking, without causing you pain. Infant's lips should be extended outward (flanged).  Swallowing heard between every 3-4 sucks once your milk has started to flow (after your let-down milk reflex occurs).  Muscle movement above and in front of his or her ears while sucking. Signs that your baby has not successfully latched onto your nipple  Sucking sounds or smacking sounds from your baby while breastfeeding.  Nipple pain. If you think your baby has not latched on correctly, slip your finger into the corner of your babys mouth to break the suction and place it between your baby's gums. Attempt to start breastfeeding again. Signs of successful breastfeeding Signs from your baby  Your baby will gradually decrease the number of sucks or will completely stop sucking.  Your baby will fall asleep.  Your baby's body will relax.  Your baby will retain a small amount of milk in his or her mouth.  Your baby will let go of your breast by himself or herself. Signs from you  Breasts that have increased in firmness, weight, and size 1-3 hours after feeding.  Breasts that are softer immediately after breastfeeding.  Increased milk volume, as well as a change in milk consistency and color by the fifth day of breastfeeding.  Nipples that are not sore, cracked, or bleeding. Signs that your baby is getting enough milk  Wetting at least 1-2 diapers during the first 24 hours after birth.  Wetting at least 5-6 diapers every 24 hours for the first week after birth. The urine should be clear or pale yellow by the age of 5 days.  Wetting 6-8 diapers every 24 hours as your baby continues to grow and develop.  At least 3 stools in a 24-hour period by the age of 5 days. The stool should be soft and yellow.  At least 3  stools in a 24-hour period by the age of 7 days. The stool should be seedy and yellow.  No loss of weight greater than 10% of birth weight during the first 3 days of life.  Average weight gain of 4-7 oz (113-198 g) per week after the age of 4 days.  Consistent daily weight gain by the age of 5 days, without weight loss after the age of 2 weeks. After a feeding, your baby may spit up a small amount of milk. This is normal. Breastfeeding frequency and duration Frequent feeding will help you make more milk and can prevent sore nipples and extremely full breasts (breast engorgement). Breastfeed when you feel the need to reduce the fullness of your breasts or when your baby shows signs of hunger. This is called "breastfeeding on demand." Signs that your baby is hungry include:  Increased alertness, activity, or restlessness.  Movement of the head from side to side.  Opening of the mouth when the corner of the mouth or cheek is stroked (rooting).  Increased sucking sounds, smacking lips, cooing, sighing, or squeaking.  Hand-to-mouth movements and sucking on fingers or hands.  Fussing or crying. Avoid introducing a pacifier to your baby in the first 4-6 weeks after your baby is born. After this time, you may choose to use a pacifier. Research has shown that pacifier use during the first year of a baby's life decreases the risk of sudden infant  death syndrome (SIDS). Allow your baby to feed on each breast as long as he or she wants. When your baby unlatches or falls asleep while feeding from the first breast, offer the second breast. Because newborns are often sleepy in the first few weeks of life, you may need to awaken your baby to get him or her to feed. Breastfeeding times will vary from baby to baby. However, the following rules can serve as a guide to help you make sure that your baby is properly fed:  Newborns (babies 32 weeks of age or younger) may breastfeed every 1-3 hours.  Newborns  should not go without breastfeeding for longer than 3 hours during the day or 5 hours during the night.  You should breastfeed your baby a minimum of 8 times in a 24-hour period. Breast milk pumping     Pumping and storing breast milk allows you to make sure that your baby is exclusively fed your breast milk, even at times when you are unable to breastfeed. This is especially important if you go back to work while you are still breastfeeding, or if you are not able to be present during feedings. Your lactation consultant can help you find a method of pumping that works best for you and give you guidelines about how long it is safe to store breast milk. Caring for your breasts while you breastfeed Nipples can become dry, cracked, and sore while breastfeeding. The following recommendations can help keep your breasts moisturized and healthy:  Avoid using soap on your nipples.  Wear a supportive bra designed especially for nursing. Avoid wearing underwire-style bras or extremely tight bras (sports bras).  Air-dry your nipples for 3-4 minutes after each feeding.  Use only cotton bra pads to absorb leaked breast milk. Leaking of breast milk between feedings is normal.  Use lanolin on your nipples after breastfeeding. Lanolin helps to maintain your skin's normal moisture barrier. Pure lanolin is not harmful (not toxic) to your baby. You may also hand express a few drops of breast milk and gently massage that milk into your nipples and allow the milk to air-dry. In the first few weeks after giving birth, some women experience breast engorgement. Engorgement can make your breasts feel heavy, warm, and tender to the touch. Engorgement peaks within 3-5 days after you give birth. The following recommendations can help to ease engorgement:  Completely empty your breasts while breastfeeding or pumping. You may want to start by applying warm, moist heat (in the shower or with warm, water-soaked hand towels)  just before feeding or pumping. This increases circulation and helps the milk flow. If your baby does not completely empty your breasts while breastfeeding, pump any extra milk after he or she is finished.  Apply ice packs to your breasts immediately after breastfeeding or pumping, unless this is too uncomfortable for you. To do this: ? Put ice in a plastic bag. ? Place a towel between your skin and the bag. ? Leave the ice on for 20 minutes, 2-3 times a day.  Make sure that your baby is latched on and positioned properly while breastfeeding. If engorgement persists after 48 hours of following these recommendations, contact your health care provider or a Advertising copywriter. Overall health care recommendations while breastfeeding  Eat 3 healthy meals and 3 snacks every day. Well-nourished mothers who are breastfeeding need an additional 450-500 calories a day. You can meet this requirement by increasing the amount of a balanced diet that you eat.  Drink enough water to keep your urine pale yellow or clear.  Rest often, relax, and continue to take your prenatal vitamins to prevent fatigue, stress, and low vitamin and mineral levels in your body (nutrient deficiencies).  Do not use any products that contain nicotine or tobacco, such as cigarettes and e-cigarettes. Your baby may be harmed by chemicals from cigarettes that pass into breast milk and exposure to secondhand smoke. If you need help quitting, ask your health care provider.  Avoid alcohol.  Do not use illegal drugs or marijuana.  Talk with your health care provider before taking any medicines. These include over-the-counter and prescription medicines as well as vitamins and herbal supplements. Some medicines that may be harmful to your baby can pass through breast milk.  It is possible to become pregnant while breastfeeding. If birth control is desired, ask your health care provider about options that will be safe while breastfeeding  your baby. Where to find more information: [employer]: www.llli.org Contact a health care provider if:  You feel like you want to stop breastfeeding or have become frustrated with breastfeeding.  Your nipples are cracked or bleeding.  Your breasts are red, tender, or warm.  You have: ? Painful breasts or nipples. ? A swollen area on either breast. ? A fever or chills. ? Nausea or vomiting. ? Drainage other than breast milk from your nipples.  Your breasts do not become full before feedings by the fifth day after you give birth.  You feel sad and depressed.  Your baby is: ? Too sleepy to eat well. ? Having trouble sleeping. ? More than 51 week old and wetting fewer than 6 diapers in a 24-hour period. ? Not gaining weight by 65 days of age.  Your baby has fewer than 3 stools in a 24-hour period.  Your baby's skin or the white parts of his or her eyes become yellow. Get help right away if:  Your baby is overly tired (lethargic) and does not want to wake up and feed.  Your baby develops an unexplained fever. Summary  Breastfeeding offers many health benefits for infant and mothers.  Try to breastfeed your infant when he or she shows early signs of hunger.  Gently tickle or stroke your baby's lips with your finger or nipple to allow the baby to open his or her mouth. Bring the baby to your breast. Make sure that much of the areola is in your baby's mouth. Offer one side and burp the baby before you offer the other side.  Talk with your health care provider or lactation consultant if you have questions or you face problems as you breastfeed. This information is not intended to replace advice given to you by your health care provider. Make sure you discuss any questions you have with your health care provider. Document Released: 04/26/2005 Document Revised: 07/21/2017 Document Reviewed: 05/28/2016 Elsevier Patient Education  2020 [employer].

## 2019-02-27 ENCOUNTER — Other Ambulatory Visit: Payer: Self-pay

## 2019-02-27 ENCOUNTER — Encounter: Payer: Self-pay | Admitting: Obstetrics and Gynecology

## 2019-02-27 ENCOUNTER — Ambulatory Visit (INDEPENDENT_AMBULATORY_CARE_PROVIDER_SITE_OTHER): Payer: Medicaid Other | Admitting: Obstetrics and Gynecology

## 2019-02-27 VITALS — BP 102/64 | HR 80 | Wt 286.6 lb

## 2019-02-27 DIAGNOSIS — R102 Pelvic and perineal pain: Secondary | ICD-10-CM

## 2019-02-27 DIAGNOSIS — Z3A31 31 weeks gestation of pregnancy: Secondary | ICD-10-CM

## 2019-02-27 DIAGNOSIS — O26893 Other specified pregnancy related conditions, third trimester: Secondary | ICD-10-CM

## 2019-02-27 DIAGNOSIS — Z3483 Encounter for supervision of other normal pregnancy, third trimester: Secondary | ICD-10-CM

## 2019-02-27 DIAGNOSIS — O99213 Obesity complicating pregnancy, third trimester: Secondary | ICD-10-CM

## 2019-02-27 LAB — POCT URINALYSIS DIPSTICK OB
Bilirubin, UA: NEGATIVE
Blood, UA: NEGATIVE
Glucose, UA: NEGATIVE
Ketones, UA: NEGATIVE
Nitrite, UA: NEGATIVE
Spec Grav, UA: >=1.03 — AB (ref 1.010–1.025)
Urobilinogen, UA: 0.2 E.U./dL
pH, UA: 6 (ref 5.0–8.0)

## 2019-02-27 MED ORDER — TETANUS-DIPHTH-ACELL PERTUSSIS 5-2.5-18.5 LF-MCG/0.5 IM SUSP
0.5000 mL | Freq: Once | INTRAMUSCULAR | Status: AC
  Administered 2019-02-27: 0.5 mL via INTRAMUSCULAR

## 2019-02-27 NOTE — Progress Notes (Signed)
ROB: Patient complains of pain in pelvis on right side. Has been taking Tylenol which helps some. Strongly advised on belly band. Had normal glucose values with checking blood sugars. Doing well with weight monitoring. BMI 49.  For repeat anesthesia consultation at 35 weeks. Discussed need for antenatal testing. Patient also expresses concern as her last baby has CP. Tdap given today. Declined flu vaccine. Discussed contraception, unsure at this time but may consider an IUD vs BTL. Desires to breastfeed (has never done before). Discussed benefits of breastfeeding, given info on Ready Set Baby. Also given [facility] class schedule. RTC in 1 week for growth scan, 2 weeks for ROB appointment.

## 2019-03-06 ENCOUNTER — Other Ambulatory Visit: Payer: Self-pay

## 2019-03-06 ENCOUNTER — Ambulatory Visit (INDEPENDENT_AMBULATORY_CARE_PROVIDER_SITE_OTHER): Payer: Medicaid Other

## 2019-03-06 DIAGNOSIS — Z362 Encounter for other antenatal screening follow-up: Secondary | ICD-10-CM | POA: Diagnosis not present

## 2019-03-06 DIAGNOSIS — O99213 Obesity complicating pregnancy, third trimester: Secondary | ICD-10-CM

## 2019-03-08 ENCOUNTER — Other Ambulatory Visit: Payer: Self-pay | Admitting: Obstetrics and Gynecology

## 2019-03-12 ENCOUNTER — Other Ambulatory Visit: Payer: Self-pay

## 2019-03-12 MED ORDER — ONDANSETRON 4 MG PO TBDP: ORAL_TABLET | ORAL | 2 refills | Status: DC

## 2019-03-13 ENCOUNTER — Other Ambulatory Visit: Payer: Self-pay

## 2019-03-13 ENCOUNTER — Encounter: Payer: Self-pay | Admitting: Obstetrics and Gynecology

## 2019-03-13 ENCOUNTER — Ambulatory Visit (INDEPENDENT_AMBULATORY_CARE_PROVIDER_SITE_OTHER): Payer: Medicaid Other | Admitting: Obstetrics and Gynecology

## 2019-03-13 VITALS — BP 126/72 | HR 86 | Wt 287.9 lb

## 2019-03-13 DIAGNOSIS — O99213 Obesity complicating pregnancy, third trimester: Secondary | ICD-10-CM

## 2019-03-13 DIAGNOSIS — Z3483 Encounter for supervision of other normal pregnancy, third trimester: Secondary | ICD-10-CM

## 2019-03-13 DIAGNOSIS — Z3A34 34 weeks gestation of pregnancy: Secondary | ICD-10-CM

## 2019-03-13 LAB — POCT URINALYSIS DIPSTICK OB
Bilirubin, UA: NEGATIVE
Blood, UA: NEGATIVE
Glucose, UA: NEGATIVE
Ketones, UA: NEGATIVE
Leukocytes, UA: NEGATIVE
Nitrite, UA: NEGATIVE
Spec Grav, UA: 1.01 (ref 1.010–1.025)
Urobilinogen, UA: 0.2 E.U./dL
pH, UA: 7 (ref 5.0–8.0)

## 2019-03-13 NOTE — Progress Notes (Signed)
Patient comes in today for ROB.  

## 2019-03-13 NOTE — Progress Notes (Signed)
ROB:  Growth = 53%.  Anes consult set for 35 weeks.  Patient encouraged to do fasting sugars and occasional postprandials.  Not formally considered diabetic.  Discussed early delivery 73 to 81 weeks by induction.  Patient states her last 2 children were induced.  Patient received belly band but she thinks she ordered "wrong size".

## 2019-03-20 ENCOUNTER — Other Ambulatory Visit: Admission: RE | Admit: 2019-03-20 | Payer: Medicaid Other | Source: Ambulatory Visit

## 2019-03-23 ENCOUNTER — Encounter
Admission: RE | Admit: 2019-03-23 | Discharge: 2019-03-23 | Disposition: A | Discharge status: Home / Self Care | Payer: Medicaid Other | Source: Ambulatory Visit | Attending: Anesthesiology | Admitting: Anesthesiology

## 2019-03-23 ENCOUNTER — Other Ambulatory Visit: Payer: Self-pay

## 2019-03-23 NOTE — Consult Note (Signed)
[facility] Anesthesia Consultation  Sara Boyd VXB:[id number] DOB: 11/21/1990 DOA: 03/23/2019 PCP: McLean-Scocuzza, Nino Glow, MD   Requesting physician: Dr. Marcelline Mates Date of consultation: 03/23/19 Reason for consultation: Obesity during pregnancy  CHIEF COMPLAINT:  Obesity during pregnancy  HISTORY OF PRESENT ILLNESS: Sara Boyd  is a 28 y.o. female with a known history of obesity in pregnancy. Since last visit she has had cardiac workup with stress test and echocardiogram which were both normal.   PAST MEDICAL HISTORY:   Past Medical History:  Diagnosis Date  . Anxiety   . Chicken pox   . Depression   . Endometritis   . GERD (gastroesophageal reflux disease)   . Headache   . Migraine   . OCD (obsessive compulsive disorder)   . Retained products of conception following abortion   . UTI (urinary tract infection)     PAST SURGICAL HISTORY:  Past Surgical History:  Procedure Laterality Date  . BLADDER SURGERY    . DILATION AND CURETTAGE OF UTERUS     elective AB  . DILATION AND EVACUATION N/A 10/29/2016   Procedure: DILATATION AND EVACUATION;  Surgeon: Gae Dry, MD;  Location: [facility] ORS;  Service: Gynecology;  Laterality: N/A;  . HERNIA REPAIR    . HERNIA REPAIR     2011  . TONSILLECTOMY     1998    SOCIAL HISTORY:  Social History   Tobacco Use  . Smoking status: Former Research scientist (life sciences)  . Smokeless tobacco: Never Used  Substance Use Topics  . Alcohol use: No    FAMILY HISTORY:  Family History  Problem Relation Age of Onset  . Cancer Mother        NHL  . Hyperlipidemia Mother   . Hypertension Mother   . COPD Father   . Heart disease Father   . Hypertension Father   . Hyperlipidemia Father   . Diabetes Maternal Grandmother   . Heart disease Maternal Grandfather   . Cancer Paternal Grandmother        breast  . Early death Paternal Grandfather   . Heart disease Paternal Grandfather     DRUG  ALLERGIES:  Allergies  Allergen Reactions  . Pineapple Shortness Of Breath    Pt states her throat shuts completely. Anything pineapple, pt states it is juice, the whole fruit, or flavoring.   . Sulfa Antibiotics Shortness Of Breath    Pt states her throat closes as well, tightness.   . Latex Itching    Pt states her throat also starts closing.   . Vicodin [Hydrocodone-Acetaminophen] Other (See Comments)    Hallucinations.   . Codeine Rash    Pt states it feels like her skin is coming off.   . Penicillins Rash    Pt states it feels like her skin is coming off.     REVIEW OF SYSTEMS:   RESPIRATORY: No cough, shortness of breath, wheezing.  CARDIOVASCULAR: No chest pain, orthopnea, edema.  HEMATOLOGY: No anemia, easy bruising or bleeding SKIN: No rash or lesion. NEUROLOGIC: No tingling, numbness, weakness.  PSYCHIATRY: No anxiety or depression.   MEDICATIONS AT HOME:  Prior to Admission medications   Medication Sig Start Date End Date Taking? Authorizing Provider  blood glucose meter kit and supplies KIT Dispense based on patient and insurance preference. Use up to four times daily as directed. (FOR ICD-9 250.00, 250.01). Patient not taking: Reported on 02/27/2019 01/26/19   Rubie Maid, MD  docusate sodium (COLACE) 100 MG capsule Take 100  mg by mouth 2 (two) times daily.    [provider]  ondansetron (ZOFRAN-ODT) 4 MG disintegrating tablet DISSOLVE 1 TABLET(4 MG) ON THE TONGUE EVERY 8 HOURS AS NEEDED FOR NAUSEA OR VOMITING 03/12/19   Rubie Maid, MD  polyethylene glycol powder (GLYCOLAX/MIRALAX) 17 GM/SCOOP powder DISSOLVE 238 GRAMS IN LIQUID AND DRINK ONCE FOR 1 DOSE. TO USE EVERY 3RD DAY IF NO BOWEL MOVEMENT OCCURS SPONTANEOUSLY Patient not taking: Reported on 12/26/2018 11/20/18   Joylene Igo, CNM  Prenatal Vit-Fe Fumarate-FA (PRENATAL MULTIVITAMIN) TABS tablet Take 1 tablet by mouth daily at 12 noon.    [provider]  vitamin C (ASCORBIC ACID) 250  MG tablet Take 250 mg by mouth daily.    [provider]  Vitamin D, Ergocalciferol, (DRISDOL) 1.25 MG (50000 UT) CAPS capsule Take 1 capsule (50,000 Units total) by mouth 2 (two) times a week. 08/31/18   Shambley, Melody N, CNM      PHYSICAL EXAMINATION:   VITAL SIGNS: Last menstrual period 07/21/2018, unknown if currently breastfeeding.  GENERAL:  28 y.o.-year-old patient no acute distress.  HEENT: Head atraumatic, normocephalic. Oropharynx and nasopharynx clear. MP 1, TM distance >3 cm, normal mouth opening. LUNGS: Normal breath sounds bilaterally, no wheezing, rales,rhonchi. No use of accessory muscles of respiration.  CARDIOVASCULAR: S1, S2 normal. No murmurs, rubs, or gallops.  EXTREMITIES: No pedal edema, cyanosis, or clubbing.  NEUROLOGIC: normal gait PSYCHIATRIC: The patient is alert and oriented x 3.  SKIN: No obvious rash, lesion, or ulcer.    IMPRESSION AND PLAN:   Sara Boyd  is a 28 y.o. female presenting with obesity during pregnancy. BMI is currently 56 at [redacted] weeks gestation.   Airway exam remains reassuring today. Spinal interspaces palpable.  We discussed analgesic options during labor including epidural analgesia. Discussed that in obesity there can be increased difficulty with epidural placement or even failure of successful epidural. We also discussed that even after successful epidural placement there is increased risk of catheter migration out of the epidural space that would require catheter replacement. Discussed use of epidural vs spinal vs GA if cesarean delivery is required. Discussed increased risk of difficult intubation during pregnancy should an emergency cesarean delivery be required.   Plan for delivery at [facility]

## 2019-03-26 ENCOUNTER — Other Ambulatory Visit: Payer: Self-pay

## 2019-03-26 MED ORDER — PROMETHAZINE HCL 25 MG PO TABS: 25.0000 mg | ORAL_TABLET | Freq: Four times a day (QID) | ORAL | 1 refills | Status: DC | PRN

## 2019-03-26 NOTE — Progress Notes (Signed)
ROB-Pt present for routine prenatal care. Pt stated having pressure and pain. Pt stated being sick on the stomach and unable to keep anything down. Pt has questions concerning continue to check her blood glucose.

## 2019-03-27 ENCOUNTER — Ambulatory Visit (INDEPENDENT_AMBULATORY_CARE_PROVIDER_SITE_OTHER): Payer: Medicaid Other | Admitting: Obstetrics and Gynecology

## 2019-03-27 ENCOUNTER — Encounter: Payer: Self-pay | Admitting: Obstetrics and Gynecology

## 2019-03-27 ENCOUNTER — Other Ambulatory Visit: Payer: Self-pay

## 2019-03-27 VITALS — BP 108/73 | HR 93 | Wt 286.6 lb

## 2019-03-27 DIAGNOSIS — O99213 Obesity complicating pregnancy, third trimester: Secondary | ICD-10-CM

## 2019-03-27 DIAGNOSIS — Z3A36 36 weeks gestation of pregnancy: Secondary | ICD-10-CM

## 2019-03-27 DIAGNOSIS — K219 Gastro-esophageal reflux disease without esophagitis: Secondary | ICD-10-CM

## 2019-03-27 DIAGNOSIS — Z3483 Encounter for supervision of other normal pregnancy, third trimester: Secondary | ICD-10-CM

## 2019-03-27 DIAGNOSIS — O99613 Diseases of the digestive system complicating pregnancy, third trimester: Secondary | ICD-10-CM

## 2019-03-27 DIAGNOSIS — O219 Vomiting of pregnancy, unspecified: Secondary | ICD-10-CM

## 2019-03-27 DIAGNOSIS — O99619 Diseases of the digestive system complicating pregnancy, unspecified trimester: Secondary | ICD-10-CM

## 2019-03-27 LAB — POCT URINALYSIS DIPSTICK OB
Bilirubin, UA: NEGATIVE
Blood, UA: NEGATIVE
Glucose, UA: NEGATIVE
Ketones, UA: NEGATIVE
Nitrite, UA: NEGATIVE
Spec Grav, UA: 1.02 (ref 1.010–1.025)
Urobilinogen, UA: 0.2 E.U./dL
pH, UA: 6 (ref 5.0–8.0)

## 2019-03-27 MED ORDER — PANTOPRAZOLE SODIUM 20 MG PO TBEC: 20.0000 mg | DELAYED_RELEASE_TABLET | Freq: Two times a day (BID) | ORAL | 1 refills | Status: DC

## 2019-03-27 NOTE — Patient Instructions (Signed)
Morning Sickness ° °Morning sickness is when a woman feels nauseous during pregnancy. This nauseous feeling may or may not come with vomiting. It often occurs in the morning, but it can be a problem at any time of day. Morning sickness is most common during the first trimester. In some cases, it may continue throughout pregnancy. Although morning sickness is unpleasant, it is usually harmless unless the woman develops severe and continual vomiting (hyperemesis gravidarum), a condition that requires more intense treatment. °What are the causes? °The exact cause of this condition is not known, but it seems to be related to normal hormonal changes that occur in pregnancy. °What increases the risk? °You are more likely to develop this condition if: °· You experienced nausea or vomiting before your pregnancy. °· You had morning sickness during a previous pregnancy. °· You are pregnant with more than one baby, such as twins. °What are the signs or symptoms? °Symptoms of this condition include: °· Nausea. °· Vomiting. °How is this diagnosed? °This condition is usually diagnosed based on your signs and symptoms. °How is this treated? °In many cases, treatment is not needed for this condition. Making some changes to what you eat may help to control symptoms. Your health care provider may also prescribe or recommend: °· Vitamin B6 supplements. °· Anti-nausea medicines. °· Ginger. °Follow these instructions at home: °Medicines °· Take over-the-counter and prescription medicines only as told by your health care provider. Do not use any prescription, over-the-counter, or herbal medicines for morning sickness without first talking with your health care provider. °· Taking multivitamins before getting pregnant can prevent or decrease the severity of morning sickness in most women. °Eating and drinking °· Eat a piece of dry toast or crackers before getting out of bed in the morning. °· Eat 5 or 6 small meals a day. °· Eat dry and  bland foods, such as rice or a baked potato. Foods that are high in carbohydrates are often helpful. °· Avoid greasy, fatty, and spicy foods. °· Have someone cook for you if the smell of any food causes nausea and vomiting. °· If you feel nauseous after taking prenatal vitamins, take the vitamins at night or with a snack. °· Snack on protein foods between meals if you are hungry. Nuts, yogurt, and cheese are good options. °· Drink fluids throughout the day. °· Try ginger ale made with real ginger, ginger tea made from fresh grated ginger, or ginger candies. °General instructions °· Do not use any products that contain nicotine or tobacco, such as cigarettes and e-cigarettes. If you need help quitting, ask your health care provider. °· Get an air purifier to keep the air in your house free of odors. °· Get plenty of fresh air. °· Try to avoid odors that trigger your nausea. °· Consider trying these methods to help relieve symptoms: °? Wearing an acupressure wristband. These wristbands are often worn for seasickness. °? Acupuncture. °Contact a health care provider if: °· Your home remedies are not working and you need medicine. °· You feel dizzy or light-headed. °· You are losing weight. °Get help right away if: °· You have persistent and uncontrolled nausea and vomiting. °· You faint. °· You have severe pain in your abdomen. °Summary °· Morning sickness is when a woman feels nauseous during pregnancy. This nauseous feeling may or may not come with vomiting. °· Morning sickness is most common during the first trimester. °· It often occurs in the morning, but it can be a problem at   any time of day. °· In many cases, treatment is not needed for this condition. Making some changes to what you eat may help to control symptoms. °This information is not intended to replace advice given to you by your health care provider. Make sure you discuss any questions you have with your health care provider. °Document Released:  06/17/2006 Document Revised: 04/08/2017 Document Reviewed: 05/29/2016 °Elsevier Patient Education © 2020 Else[employer] Inc. ° °

## 2019-03-27 NOTE — Progress Notes (Signed)
ROB: Complains that nausea/vomiting has picked back up, still tolerating liquids but not able to tolerate many solids, with associated reflux.  Is taking Zofran which helps some. Takes Phenergan occasionally but notes it doesn't help as much. Will also prescribe Protonx. Also noting pelvic pain but knows it is due to stretching. Is s/p second Anesthesia consultation, ok to deliver at [facility]. RTC in 1 week, for NST and visit. For growth scan in 2 weeks. For 36 week labs at that time. Discussed again need for induction prior to 40 weeks.

## 2019-04-03 ENCOUNTER — Ambulatory Visit (INDEPENDENT_AMBULATORY_CARE_PROVIDER_SITE_OTHER): Payer: Medicaid Other | Admitting: Obstetrics and Gynecology

## 2019-04-03 ENCOUNTER — Other Ambulatory Visit: Payer: Medicaid Other

## 2019-04-03 ENCOUNTER — Other Ambulatory Visit: Payer: Self-pay

## 2019-04-03 VITALS — BP 112/78 | HR 100 | Wt 285.9 lb

## 2019-04-03 DIAGNOSIS — O219 Vomiting of pregnancy, unspecified: Secondary | ICD-10-CM

## 2019-04-03 DIAGNOSIS — Z3A37 37 weeks gestation of pregnancy: Secondary | ICD-10-CM | POA: Diagnosis not present

## 2019-04-03 DIAGNOSIS — Z3483 Encounter for supervision of other normal pregnancy, third trimester: Secondary | ICD-10-CM

## 2019-04-03 LAB — POCT URINALYSIS DIPSTICK OB
Bilirubin, UA: NEGATIVE
Blood, UA: NEGATIVE
Glucose, UA: NEGATIVE
Ketones, UA: NEGATIVE
Leukocytes, UA: NEGATIVE
Nitrite, UA: NEGATIVE
Spec Grav, UA: 1.01 (ref 1.010–1.025)
Urobilinogen, UA: 0.2 E.U./dL
pH, UA: 6.5 (ref 5.0–8.0)

## 2019-04-03 NOTE — Progress Notes (Signed)
ROB: NST reactive.  GBS-GC/CT performed.  She says "nothing is helping" with her daily nausea and vomiting.  She says she has just learned to live with it.  She expects it to resolve when she has the baby.

## 2019-04-03 NOTE — Progress Notes (Signed)
Patient comes in today for NST and ROB visit. No concerns.

## 2019-04-03 NOTE — Addendum Note (Signed)
Addended by: Durwin Glaze on: 04/03/2019 03:41 PM   Modules accepted: Orders

## 2019-04-05 LAB — STREP GP B NAA+RFLX: Strep Gp B NAA+Rflx: NEGATIVE

## 2019-04-07 LAB — GC/CHLAMYDIA PROBE AMP
Chlamydia trachomatis, NAA: NEGATIVE
Neisseria Gonorrhoeae by PCR: NEGATIVE

## 2019-04-11 ENCOUNTER — Other Ambulatory Visit: Payer: Self-pay

## 2019-04-11 ENCOUNTER — Ambulatory Visit (INDEPENDENT_AMBULATORY_CARE_PROVIDER_SITE_OTHER): Payer: Medicaid Other | Admitting: Obstetrics and Gynecology

## 2019-04-11 ENCOUNTER — Ambulatory Visit (INDEPENDENT_AMBULATORY_CARE_PROVIDER_SITE_OTHER): Payer: Medicaid Other

## 2019-04-11 ENCOUNTER — Encounter: Payer: Self-pay | Admitting: Obstetrics and Gynecology

## 2019-04-11 VITALS — BP 113/82 | HR 109 | Wt 286.1 lb

## 2019-04-11 DIAGNOSIS — Z3483 Encounter for supervision of other normal pregnancy, third trimester: Secondary | ICD-10-CM

## 2019-04-11 DIAGNOSIS — O99213 Obesity complicating pregnancy, third trimester: Secondary | ICD-10-CM | POA: Diagnosis not present

## 2019-04-11 DIAGNOSIS — O479 False labor, unspecified: Secondary | ICD-10-CM

## 2019-04-11 DIAGNOSIS — Z3A38 38 weeks gestation of pregnancy: Secondary | ICD-10-CM | POA: Diagnosis not present

## 2019-04-11 DIAGNOSIS — Z362 Encounter for other antenatal screening follow-up: Secondary | ICD-10-CM | POA: Diagnosis not present

## 2019-04-11 LAB — POCT URINALYSIS DIPSTICK OB
Bilirubin, UA: NEGATIVE
Blood, UA: NEGATIVE
Glucose, UA: NEGATIVE
Ketones, UA: NEGATIVE
Leukocytes, UA: NEGATIVE
Nitrite, UA: NEGATIVE
Spec Grav, UA: >=1.03 — AB (ref 1.010–1.025)
Urobilinogen, UA: 0.2 E.U./dL
pH, UA: 6 (ref 5.0–8.0)

## 2019-04-11 NOTE — Progress Notes (Signed)
ROB-Pt present for routine prenatal care. Pt stated having contractions, lower back pain and fatigue.

## 2019-04-11 NOTE — Progress Notes (Signed)
ROB: Notes some irregular contractions. Discussed IOL for morbid obesity next week, scheduled for 04/19/2019. NST performed today was reviewed and was found to be reactive.  Continue recommended antenatal testing and prenatal care.   NONSTRESS TEST INTERPRETATION  INDICATIONS: Obesity  FHR baseline: 140 bpm RESULTS:Reactive COMMENTS: No contractions, some uterine irritability   PLAN: 1. Continue fetal kick counts twice a day. 2. Scheduled for IOL on 04/19/2019.

## 2019-04-11 NOTE — Patient Instructions (Signed)
    COVID 19 Instructions for Scheduled Procedure (Inductions/C-sections and GYN surgeries)   Thank you for choosing [facility] for your services.  You have been scheduled for a procedure called _________________________________________.    Your procedure is scheduled on __________________________________________.  You are required to have COVID-19 testing performed 2 days prior to your scheduled procedure date.  Testing is performed between 9 and 10 AM Monday through Friday.  Please present for testing on ________________________ during this hour. Drive up testing is performed in front of [facility] (this is next to the [facility]).    Upon your scheduled procedure date, you will need to arrive at the [facility] entrance. (There is a statue at the front of this entrance.)   Please arrive on time if you are scheduled for an induction of labor.   If you are scheduled for a Cesarean delivery or for Gyn Surgery, arrive 2 hours prior to your procedure time.   If you are an Obstetric patient and your arrival time falls between 11 PM and 6 AM call L&D ([phone]) when you arrive.  A staff member will meet you at the [facility] entrance.  At this time, patients are allowed 1 support person to accompany them. Face masks are required for you and your support person. Your support person is now allowed to be there with you during the entire time of your admission.   Please contact the office if you have any questions regarding this information.  The [facility] office number is [phone].     Thank you,    Your [facility] Providers    

## 2019-04-16 ENCOUNTER — Other Ambulatory Visit: Payer: Self-pay

## 2019-04-16 ENCOUNTER — Other Ambulatory Visit
Admission: RE | Admit: 2019-04-16 | Discharge: 2019-04-16 | Disposition: A | Discharge status: Home / Self Care | Payer: Medicaid Other | Source: Ambulatory Visit | Attending: Obstetrics and Gynecology | Admitting: Obstetrics and Gynecology

## 2019-04-16 DIAGNOSIS — Z01812 Encounter for preprocedural laboratory examination: Secondary | ICD-10-CM | POA: Diagnosis not present

## 2019-04-16 DIAGNOSIS — Z20828 Contact with and (suspected) exposure to other viral communicable diseases: Secondary | ICD-10-CM | POA: Insufficient documentation

## 2019-04-16 LAB — SARS CORONAVIRUS 2 (TAT 6-24 HRS): SARS Coronavirus 2: NEGATIVE

## 2019-04-19 ENCOUNTER — Encounter: Payer: Self-pay | Admitting: Obstetrics and Gynecology

## 2019-04-19 ENCOUNTER — Ambulatory Visit (INDEPENDENT_AMBULATORY_CARE_PROVIDER_SITE_OTHER): Payer: Medicaid Other | Admitting: Obstetrics and Gynecology

## 2019-04-19 ENCOUNTER — Other Ambulatory Visit: Payer: Self-pay

## 2019-04-19 ENCOUNTER — Telehealth: Payer: Self-pay

## 2019-04-19 VITALS — BP 105/74 | HR 99 | Wt 283.0 lb

## 2019-04-19 DIAGNOSIS — Z3483 Encounter for supervision of other normal pregnancy, third trimester: Secondary | ICD-10-CM | POA: Diagnosis not present

## 2019-04-19 NOTE — Progress Notes (Signed)
ROB: Patient presents for NST and possible foley bulb insertion. She was scheduled for IOL for morbid obesity this morning however L&D unit remains full with limited availability for now.  Discussion had with patient regarding options of expectant management until 40 weeks, membrane stripping, foley bulb placement. She is very hesitant regarding interventions outside of the hospital. Patient desires expectant management for now. Will reschedule induction for Wednesday 04/25/19 at 8 a.m. Reiterated kick counts. Informed she will need to redo COVID screen.    NONSTRESS TEST INTERPRETATION  INDICATIONS: Obesity  FHR baseline: 135 RESULTS:Reactive COMMENTS: Uterine irritability    PLAN: 1. Continue fetal kick counts twice a day. 2. Scheduled for IOL next week.

## 2019-04-19 NOTE — Telephone Encounter (Signed)
Pt called to see if she could come in at 1:30pm to do a foley bulb. Pt stated that she could.

## 2019-04-19 NOTE — Progress Notes (Signed)
ROB-Pt present for NST and routine prenatal care. Pt stated having pain and pressure in the vaginal area and pelvic pain. Pt stated not being sure if she wants to go home with the foley bulb due to pass pregnancies.

## 2019-04-20 ENCOUNTER — Encounter: Payer: Self-pay | Admitting: Surgical

## 2019-04-23 ENCOUNTER — Other Ambulatory Visit
Admission: RE | Admit: 2019-04-23 | Discharge: 2019-04-23 | Disposition: A | Discharge status: Home / Self Care | Payer: Medicaid Other | Source: Ambulatory Visit | Attending: Obstetrics and Gynecology | Admitting: Obstetrics and Gynecology

## 2019-04-23 ENCOUNTER — Other Ambulatory Visit: Payer: Self-pay

## 2019-04-23 DIAGNOSIS — Z20828 Contact with and (suspected) exposure to other viral communicable diseases: Secondary | ICD-10-CM | POA: Diagnosis not present

## 2019-04-23 DIAGNOSIS — Z01812 Encounter for preprocedural laboratory examination: Secondary | ICD-10-CM | POA: Diagnosis present

## 2019-04-23 LAB — SARS CORONAVIRUS 2 (TAT 6-24 HRS): SARS Coronavirus 2: NEGATIVE

## 2019-04-25 ENCOUNTER — Encounter: Payer: Self-pay | Admitting: Obstetrics and Gynecology

## 2019-04-25 ENCOUNTER — Inpatient Hospital Stay: Payer: Medicaid Other | Admitting: Anesthesiology

## 2019-04-25 ENCOUNTER — Inpatient Hospital Stay
Admission: RE | Admit: 2019-04-25 | Discharge: 2019-04-28 | DRG: 807 | Disposition: A | Discharge status: Home / Self Care | Payer: Medicaid Other | Attending: Obstetrics and Gynecology | Admitting: Obstetrics and Gynecology

## 2019-04-25 ENCOUNTER — Other Ambulatory Visit: Payer: Self-pay

## 2019-04-25 DIAGNOSIS — O9962 Diseases of the digestive system complicating childbirth: Secondary | ICD-10-CM | POA: Diagnosis present

## 2019-04-25 DIAGNOSIS — K219 Gastro-esophageal reflux disease without esophagitis: Secondary | ICD-10-CM | POA: Diagnosis present

## 2019-04-25 DIAGNOSIS — O99344 Other mental disorders complicating childbirth: Secondary | ICD-10-CM | POA: Diagnosis present

## 2019-04-25 DIAGNOSIS — O99213 Obesity complicating pregnancy, third trimester: Secondary | ICD-10-CM | POA: Diagnosis present

## 2019-04-25 DIAGNOSIS — O99214 Obesity complicating childbirth: Secondary | ICD-10-CM | POA: Diagnosis present

## 2019-04-25 DIAGNOSIS — O480 Post-term pregnancy: Secondary | ICD-10-CM | POA: Diagnosis not present

## 2019-04-25 DIAGNOSIS — Z3A40 40 weeks gestation of pregnancy: Secondary | ICD-10-CM

## 2019-04-25 LAB — CBC
HCT: 36.5 % (ref 36.0–46.0)
Hemoglobin: 12.1 g/dL (ref 12.0–15.0)
MCH: 28.9 pg (ref 26.0–34.0)
MCHC: 33.2 g/dL (ref 30.0–36.0)
MCV: 87.3 fL (ref 80.0–100.0)
Platelets: 220 10*3/uL (ref 150–400)
RBC: 4.18 MIL/uL (ref 3.87–5.11)
RDW: 14.7 % (ref 11.5–15.5)
WBC: 10.2 10*3/uL (ref 4.0–10.5)
nRBC: 0 % (ref 0.0–0.2)

## 2019-04-25 LAB — TYPE AND SCREEN
ABO/RH(D): O NEG
Antibody Screen: NEGATIVE
DAT, IgG: NEGATIVE
Weak D: POSITIVE

## 2019-04-25 LAB — RPR: RPR Ser Ql: NONREACTIVE

## 2019-04-25 MED ORDER — TERBUTALINE SULFATE 1 MG/ML IJ SOLN: 0.2500 mg | Freq: Once | INTRAMUSCULAR | Status: DC | PRN

## 2019-04-25 MED ORDER — OXYTOCIN 40 UNITS IN NORMAL SALINE INFUSION - SIMPLE MED
2.5000 [IU]/h | INTRAVENOUS | Status: DC
  Filled 2019-04-25: qty 1000

## 2019-04-25 MED ORDER — LIDOCAINE HCL (PF) 1 % IJ SOLN
INTRAMUSCULAR | Status: AC
  Filled 2019-04-25: qty 30

## 2019-04-25 MED ORDER — LIDOCAINE HCL (PF) 1 % IJ SOLN
30.0000 mL | INTRAMUSCULAR | Status: AC | PRN
  Administered 2019-04-25: 3 mL via SUBCUTANEOUS

## 2019-04-25 MED ORDER — MISOPROSTOL 100 MCG PO TABS
50.0000 ug | ORAL_TABLET | ORAL | Status: DC | PRN
  Administered 2019-04-25: 50 ug via VAGINAL
  Filled 2019-04-25 (×3): qty 1

## 2019-04-25 MED ORDER — PHENYLEPHRINE 40 MCG/ML (10ML) SYRINGE FOR IV PUSH (FOR BLOOD PRESSURE SUPPORT)
80.0000 ug | PREFILLED_SYRINGE | INTRAVENOUS | Status: DC | PRN
  Filled 2019-04-25: qty 10

## 2019-04-25 MED ORDER — OXYTOCIN 40 UNITS IN NORMAL SALINE INFUSION - SIMPLE MED
1.0000 m[IU]/min | INTRAVENOUS | Status: DC
  Administered 2019-04-25: 4 m[IU]/min via INTRAVENOUS

## 2019-04-25 MED ORDER — LIDOCAINE-EPINEPHRINE (PF) 1.5 %-1:200000 IJ SOLN
INTRAMUSCULAR | Status: DC | PRN
  Administered 2019-04-25: 3 mL via PERINEURAL

## 2019-04-25 MED ORDER — EPHEDRINE 5 MG/ML INJ
10.0000 mg | INTRAVENOUS | Status: DC | PRN
  Filled 2019-04-25: qty 2

## 2019-04-25 MED ORDER — BUTORPHANOL TARTRATE 2 MG/ML IJ SOLN: 2.0000 mg | INTRAMUSCULAR | Status: DC | PRN

## 2019-04-25 MED ORDER — ACETAMINOPHEN 325 MG PO TABS
650.0000 mg | ORAL_TABLET | ORAL | Status: DC | PRN
  Filled 2019-04-25: qty 2

## 2019-04-25 MED ORDER — LACTATED RINGERS IV SOLN
500.0000 mL | INTRAVENOUS | Status: DC | PRN
  Administered 2019-04-25: 1000 mL via INTRAVENOUS

## 2019-04-25 MED ORDER — ONDANSETRON HCL 4 MG/2ML IJ SOLN
4.0000 mg | Freq: Four times a day (QID) | INTRAMUSCULAR | Status: DC | PRN
  Administered 2019-04-25 (×2): 4 mg via INTRAVENOUS
  Filled 2019-04-25 (×2): qty 2

## 2019-04-25 MED ORDER — FENTANYL 2.5 MCG/ML W/ROPIVACAINE 0.15% IN NS 100 ML EPIDURAL (ARMC)
EPIDURAL | Status: AC
  Filled 2019-04-25: qty 100

## 2019-04-25 MED ORDER — OXYTOCIN BOLUS FROM INFUSION
500.0000 mL | Freq: Once | INTRAVENOUS | Status: AC
  Administered 2019-04-26: 500 mL via INTRAVENOUS

## 2019-04-25 MED ORDER — MISOPROSTOL 200 MCG PO TABS
ORAL_TABLET | ORAL | Status: AC
  Administered 2019-04-25: 50 ug via VAGINAL
  Filled 2019-04-25: qty 4

## 2019-04-25 MED ORDER — BUPIVACAINE HCL (PF) 0.25 % IJ SOLN
INTRAMUSCULAR | Status: DC | PRN
  Administered 2019-04-25: 8 mL via EPIDURAL

## 2019-04-25 MED ORDER — DIPHENHYDRAMINE HCL 50 MG/ML IJ SOLN: 12.5000 mg | INTRAMUSCULAR | Status: DC | PRN

## 2019-04-25 MED ORDER — LACTATED RINGERS IV SOLN: 500.0000 mL | Freq: Once | INTRAVENOUS | Status: DC

## 2019-04-25 MED ORDER — LACTATED RINGERS IV SOLN: INTRAVENOUS | Status: DC

## 2019-04-25 MED ORDER — OXYCODONE-ACETAMINOPHEN 5-325 MG PO TABS: 1.0000 | ORAL_TABLET | ORAL | Status: DC | PRN

## 2019-04-25 MED ORDER — OXYCODONE-ACETAMINOPHEN 5-325 MG PO TABS: 2.0000 | ORAL_TABLET | ORAL | Status: DC | PRN

## 2019-04-25 MED ORDER — FENTANYL 2.5 MCG/ML W/ROPIVACAINE 0.15% IN NS 100 ML EPIDURAL (ARMC)
12.0000 mL/h | EPIDURAL | Status: DC
  Administered 2019-04-25: 12 mL/h via EPIDURAL

## 2019-04-25 MED ORDER — AMMONIA AROMATIC IN INHA
RESPIRATORY_TRACT | Status: AC
  Filled 2019-04-25: qty 10

## 2019-04-25 MED ORDER — SOD CITRATE-CITRIC ACID 500-334 MG/5ML PO SOLN: 30.0000 mL | ORAL | Status: DC | PRN

## 2019-04-25 MED ORDER — OXYTOCIN 10 UNIT/ML IJ SOLN
INTRAMUSCULAR | Status: AC
  Filled 2019-04-25: qty 2

## 2019-04-25 NOTE — Progress Notes (Signed)
Patient ID: Toy Care, female   DOB: [birth year], 28 y.o.   MRN: [id number]   Cervix unchanged. Head very high Pt comfortable - not contracting. FHR - Accels 60mcg misoprostol placed.

## 2019-04-25 NOTE — Anesthesia Procedure Notes (Signed)
Epidural Patient location during procedure: OB Start time: 04/25/2019 10:50 PM End time: 04/25/2019 11:03 PM  Staffing Anesthesiologist: Alvin Critchley, MD Performed: anesthesiologist   Preanesthetic Checklist Completed: patient identified, IV checked, site marked, risks and benefits discussed, surgical consent, monitors and equipment checked, pre-op evaluation and timeout performed  Epidural Patient position: sitting Prep: Betadine Patient monitoring: heart rate, continuous pulse ox and blood pressure Approach: midline Location: L3-L4 Injection technique: LOR air  Needle:  Needle type: Tuohy  Needle gauge: 17 G Needle length: 9 cm and 9 Catheter type: closed end flexible Catheter size: 19 Gauge Test dose: negative and 1.5% lidocaine with Epi 1:200 K  Assessment Events: blood not aspirated, injection not painful, no injection resistance, no paresthesia and negative IV test  Additional Notes Time out called.  Patient placed in sitting position.  Back prepped and draped in sterile fashion.  A skin wheal was made in the L3-L4 interspace with 1% Lidocaine plain.  A 17G Tuohy needle was advanced into the epidural space by a loss of resistance technique.  The catheter was threaded 3cm and the TD was negative.  Reason for block:procedure for pain

## 2019-04-25 NOTE — Anesthesia Preprocedure Evaluation (Addendum)
Anesthesia Evaluation  Patient identified by MRN, date of birth, ID band Patient awake    Reviewed: Allergy & Precautions, NPO status , Patient's Chart, lab work & pertinent test results  Airway Mallampati: II  TM Distance: <3 FB     Dental  (+) Caps   Pulmonary former smoker,    Pulmonary exam normal        Cardiovascular negative cardio ROS Normal cardiovascular exam     Neuro/Psych  Headaches, PSYCHIATRIC DISORDERS Anxiety Depression negative neurological ROS  negative psych ROS   GI/Hepatic Neg liver ROS, GERD  ,  Endo/Other  Morbid obesity  Renal/GU negative Renal ROS  negative genitourinary   Musculoskeletal negative musculoskeletal ROS (+)   Abdominal Normal abdominal exam  (+)   Peds negative pediatric ROS (+)  Hematology negative hematology ROS (+)   Anesthesia Other Findings   Reproductive/Obstetrics (+) Pregnancy                             Anesthesia Physical  Anesthesia Plan  ASA: II  Anesthesia Plan: Epidural   Post-op Pain Management:    Induction:   PONV Risk Score and Plan:   Airway Management Planned: Natural Airway  Additional Equipment:   Intra-op Plan:   Post-operative Plan:   Informed Consent: I have reviewed the patients History and Physical, chart, labs and discussed the procedure including the risks, benefits and alternatives for the proposed anesthesia with the patient or authorized representative who has indicated his/her understanding and acceptance.     Dental advisory given  Plan Discussed with: CRNA and Surgeon  Anesthesia Plan Comments: (Very anxious individual.)       Anesthesia Quick Evaluation

## 2019-04-25 NOTE — Progress Notes (Signed)
Pt presents to L&D for scheduled IOL

## 2019-04-25 NOTE — Anesthesia Procedure Notes (Signed)
Epidural Patient location during procedure: OB Start time: 04/25/2019 10:50 PM End time: 04/25/2019 11:03 PM  Staffing Anesthesiologist: Alvin Critchley, MD Performed: anesthesiologist   Preanesthetic Checklist Completed: patient identified, IV checked, site marked, risks and benefits discussed, surgical consent, monitors and equipment checked, pre-op evaluation and timeout performed  Epidural Patient position: sitting Prep: Betadine Patient monitoring: heart rate, continuous pulse ox and blood pressure Approach: midline Location: L4-L5 Injection technique: LOR saline  Needle:  Needle type: Tuohy  Needle gauge: 18 G Needle length: 9 cm and 9 Catheter type: closed end flexible Catheter size: 20 Guage Test dose: negative and 1.5% lidocaine with Epi 1:200 K  Assessment Events: blood not aspirated, injection not painful, no injection resistance, no paresthesia and negative IV test  Additional Notes Time out called.  Patient placed in sitting position.  Back prepped and draped in sterile fashion.  A skin wheal was made in the L3-L4 interspace with 1% Lidocaine plain.  A 17G Tuohy needle was advanced into the epidural space by a loss of resistance technique.  The epidural catheter was threaded 3 cm and the TD was negative.  Patient anxious throughout and before the procedure with high HR.  Patient tolerated the procedure well.Reason for block:procedure for pain

## 2019-04-25 NOTE — Progress Notes (Signed)
Patient ID: Sara Boyd, female   DOB: 1991-04-09, 28 y.o.   MRN: [id number]   Cervix unchanged. Head very high Pt comfortable - not contracting. FHR - Accels Will start pitocin to try to induce contractions.

## 2019-04-25 NOTE — H&P (Signed)
History and Physical   HPI  Sara Boyd is a 28 y.o. [id number] at 53w0dEstimated Date of Delivery: 04/25/19 who is being admitted for induction of labor for increased BMI   OB History  OB History  Gravida Para Term Preterm AB Living  _0 0 1 2  SAB TAB Ectopic Multiple Live Births  1 0 0 0 2    # Outcome Date GA Lbr Len/2nd Weight Sex Delivery Anes PTL Lv  4 Current           3 SAB 2018          2 Term 2014   2977 g M Vag-Spont   LIV  1 Term 2010   2296 g F Vag-Spont   LIV    Obstetric Comments  Pt water ruptured @ 19 weeks, bed rest, baby with CP    PROBLEM LIST  Pregnancy complications or risks: Patient Active Problem List   Diagnosis Date Noted  . Obesity complicating pregnancy in third trimester 04/25/2019  . Abdominal pain in pregnancy, third trimester 02/09/2019  . Pelvic pain affecting pregnancy in second trimester, antepartum 12/05/2018  . Constipation 11/16/2018  . Episode of moderate major depression ([facility] 05/09/2018  . Anxiety and depression 05/04/2018  . Panic disorder 05/04/2018  . Obsessive-compulsive disorder 05/04/2018  . Chronic UTI 08/08/2017  . Chest pain 08/08/2017  . Anxiety 08/08/2017  . Myalgia 08/08/2017  . Gastroesophageal reflux disease 08/08/2017  . Class 3 severe obesity due to excess calories with body mass index (BMI) of 50.0 to 59.9 in adult ([place] 08/08/2017  . Palpitations 08/08/2017  . Fatigue 08/08/2017  . Vitamin D deficiency 08/07/2017     Prenatal labs and studies: ABO, Rh: --/--/PENDING (12/16 09381 Antibody: PENDING (12/16 0742) Rubella: 2.97 (04/29 1509) RPR: Non Reactive (10/07 0816)  HBsAg: Negative (04/29 1509)  HIV: Non Reactive (04/29 1509)  GBS:--/Negative (11/24 1551)   Past Medical History:  Diagnosis Date  . Anxiety   . Chicken pox   . Depression   . Endometritis   . GERD (gastroesophageal reflux disease)   . Headache   . Migraine   . OCD (obsessive compulsive disorder)   .  Retained products of conception following abortion   . UTI (urinary tract infection)      Past Surgical History:  Procedure Laterality Date  . BLADDER SURGERY    . DILATION AND CURETTAGE OF UTERUS     elective AB  . DILATION AND EVACUATION N/A 10/29/2016   Procedure: DILATATION AND EVACUATION;  Surgeon: HGae Dry MD;  Location: [facility] ORS;  Service: Gynecology;  Laterality: N/A;  . HERNIA REPAIR    . HERNIA REPAIR     2011  . TONSILLECTOMY     1998     Medications    Current Discharge Medication List    CONTINUE these medications which have NOT CHANGED   Details  docusate sodium (COLACE) 100 MG capsule Take 100 mg by mouth 2 (two) times daily.    ondansetron (ZOFRAN-ODT) 4 MG disintegrating tablet DISSOLVE 1 TABLET(4 MG) ON THE TONGUE EVERY 8 HOURS AS NEEDED FOR NAUSEA OR VOMITING Qty: 30 tablet, Refills: 2    Prenatal Vit-Fe Fumarate-FA (PRENATAL MULTIVITAMIN) TABS tablet Take 1 tablet by mouth daily at 12 noon.    vitamin C (ASCORBIC ACID) 250 MG tablet Take 250 mg by mouth daily.    Vitamin D, Ergocalciferol, (DRISDOL) 1.25 MG (50000 UT) CAPS capsule Take 1 capsule (50,000  Units total) by mouth 2 (two) times a week. Qty: 30 capsule, Refills: 2    blood glucose meter kit and supplies KIT Dispense based on patient and insurance preference. Use up to four times daily as directed. (FOR ICD-9 250.00, 250.01). Qty: 1 each, Refills: 0    pantoprazole (PROTONIX) 20 MG tablet Take 1 tablet (20 mg total) by mouth 2 (two) times daily. Qty: 60 tablet, Refills: 1         Allergies  Pineapple, Sulfa antibiotics, Latex, Vicodin [hydrocodone-acetaminophen], Codeine, and Penicillins  Review of Systems  Pertinent items are noted in HPI.  Physical Exam  BP 114/78 (BP Location: Left Arm)   Pulse (!) 105   Temp 98.7 F (37.1 C) (Oral)   Resp 18   Ht _0  (1.626 m)   Wt 128.4 kg   LMP 07/21/2018 (Exact Date)   BMI 48.58 kg/m   Lungs:  CTA B Cardio: RRR without  M/R/G Abd: Soft, gravid, NT Presentation: cephalic EXT: No C/C/ 1+ Edema DTRs: 2+ B CERVIX: Dilation: 2 Effacement (%): 20 Cervical Position: Posterior Station: -3 Presentation: Vertex Exam by:: D.Estrella Alcaraz, MD   See Prenatal records for more detailed PE.    Difficult to trace but appears to have accels   Test Results  Results for orders placed or performed during the hospital encounter of 04/25/19 (from the past 24 hour(s))  CBC     Status: None   Collection Time: 04/25/19  7:42 AM  Result Value Ref Range   WBC 10.2 4.0 - 10.5 K/uL   RBC 4.18 3.87 - 5.11 MIL/uL   Hemoglobin 12.1 12.0 - 15.0 g/dL   HCT 36.5 36.0 - 46.0 %   MCV 87.3 80.0 - 100.0 fL   MCH 28.9 26.0 - 34.0 pg   MCHC 33.2 30.0 - 36.0 g/dL   RDW 14.7 11.5 - 15.5 %   Platelets 220 150 - 400 K/uL   nRBC 0.0 0.0 - 0.2 %  Type and screen     Status: None (Preliminary result)   Collection Time: 04/25/19  7:42 AM  Result Value Ref Range   ABO/RH(D) PENDING    Antibody Screen PENDING    Sample Expiration      04/28/2019,2359 Performed at [facility], [address] [place], [place] [address]      Assessment   [id number] at 35w0dEstimated Date of Delivery: 04/25/19  Increased BMI  Patient Active Problem List   Diagnosis Date Noted  . Obesity complicating pregnancy in third trimester 04/25/2019  . Abdominal pain in pregnancy, third trimester 02/09/2019  . Pelvic pain affecting pregnancy in second trimester, antepartum 12/05/2018  . Constipation 11/16/2018  . Episode of moderate major depression ([place] 05/09/2018  . Anxiety and depression 05/04/2018  . Panic disorder 05/04/2018  . Obsessive-compulsive disorder 05/04/2018  . Chronic UTI 08/08/2017  . Chest pain 08/08/2017  . Anxiety 08/08/2017  . Myalgia 08/08/2017  . Gastroesophageal reflux disease 08/08/2017  . Class 3 severe obesity due to excess calories with body mass index (BMI) of 50.0 to 59.9 in adult ([facility] 08/08/2017  . Palpitations  08/08/2017  . Fatigue 08/08/2017  . Vitamin D deficiency 08/07/2017    Plan  1. Admit to L&D :   anticipate vaginal delivery 2. EFM: -- Category 1 3. Epidural if desired.  Stadol for IV pain until epidural requested. 4. Admission labs    5104m Misoprostol placed  DaFinis BudM.D. 04/25/2019 8:27 AM

## 2019-04-26 ENCOUNTER — Encounter: Payer: Self-pay | Admitting: Obstetrics and Gynecology

## 2019-04-26 DIAGNOSIS — O480 Post-term pregnancy: Secondary | ICD-10-CM

## 2019-04-26 DIAGNOSIS — Z3A40 40 weeks gestation of pregnancy: Secondary | ICD-10-CM

## 2019-04-26 MED ORDER — IBUPROFEN 600 MG PO TABS
600.0000 mg | ORAL_TABLET | Freq: Four times a day (QID) | ORAL | Status: DC
  Administered 2019-04-27 – 2019-04-28 (×5): 600 mg via ORAL
  Filled 2019-04-26 (×5): qty 1

## 2019-04-26 MED ORDER — DIPHENHYDRAMINE HCL 25 MG PO CAPS: 25.0000 mg | ORAL_CAPSULE | Freq: Four times a day (QID) | ORAL | Status: DC | PRN

## 2019-04-26 MED ORDER — DOCUSATE SODIUM 100 MG PO CAPS
100.0000 mg | ORAL_CAPSULE | Freq: Two times a day (BID) | ORAL | Status: DC
  Administered 2019-04-26 – 2019-04-28 (×4): 100 mg via ORAL
  Filled 2019-04-26 (×4): qty 1

## 2019-04-26 MED ORDER — SIMETHICONE 80 MG PO CHEW: 80.0000 mg | CHEWABLE_TABLET | ORAL | Status: DC | PRN

## 2019-04-26 MED ORDER — ACETAMINOPHEN 325 MG PO TABS: 650.0000 mg | ORAL_TABLET | ORAL | Status: DC | PRN

## 2019-04-26 MED ORDER — BENZOCAINE-MENTHOL 20-0.5 % EX AERO
1.0000 "application " | INHALATION_SPRAY | CUTANEOUS | Status: DC | PRN
  Filled 2019-04-26: qty 56

## 2019-04-26 MED ORDER — OXYTOCIN 40 UNITS IN NORMAL SALINE INFUSION - SIMPLE MED: 2.5000 [IU]/h | INTRAVENOUS | Status: DC | PRN

## 2019-04-26 MED ORDER — IBUPROFEN 600 MG PO TABS
600.0000 mg | ORAL_TABLET | Freq: Four times a day (QID) | ORAL | Status: DC
  Administered 2019-04-26 (×3): 600 mg via ORAL
  Filled 2019-04-26 (×3): qty 1

## 2019-04-26 MED ORDER — TETANUS-DIPHTH-ACELL PERTUSSIS 5-2.5-18.5 LF-MCG/0.5 IM SUSP: 0.5000 mL | Freq: Once | INTRAMUSCULAR | Status: DC

## 2019-04-26 MED ORDER — PRENATAL MULTIVITAMIN CH
1.0000 | ORAL_TABLET | Freq: Every day | ORAL | Status: DC
  Administered 2019-04-26 – 2019-04-27 (×2): 1 via ORAL
  Filled 2019-04-26 (×2): qty 1

## 2019-04-26 MED ORDER — ZOLPIDEM TARTRATE 5 MG PO TABS: 5.0000 mg | ORAL_TABLET | Freq: Every evening | ORAL | Status: DC | PRN

## 2019-04-26 NOTE — Lactation Note (Signed)
This note was copied from a baby's chart. Lactation Consultation Note  Patient Name: Sara Boyd [id number] Date: 04/26/2019 Reason for consult: Initial assessment;1st time breastfeeding;Term  LC in to see mom and baby [name]. This is mom's third baby, but first attempt with breastfeeding. Mom reports baby to have breastfed well, for approximately 15 minutes on her left breast after delivery. She attempted a feed, a few minutes ago, but states baby fell asleep right after diaper change.  [name] praised mom for her decision to breastfeed. Education given for newborn behaviors, feeding frequency, newborn stomach size, early hunger cues, feeding on cue- not on schedule.  Mom had questions about foods to avoid; advised to consume all food groups moderately, no foods off limits at this time; cautioned against foods that may cause her discomfort, and encouraged water. Encouraged mom to call out for [facility] support as needed.  Maternal Data Formula Feeding for Exclusion: No Has patient been taught Hand Expression?: Yes Does the patient have breastfeeding experience prior to this delivery?: No(2 other kids; first attempt with BF)  Feeding    LATCH Score                   Interventions Interventions: Breast feeding basics reviewed;Hand express  Lactation Tools Discussed/Used     Consult Status Consult Status: Follow-up Date: 04/26/19 Follow-up type: In-patient    Lavonia Drafts 04/26/2019, 11:51 AM

## 2019-04-27 NOTE — Lactation Note (Signed)
This note was copied from a baby's chart. Lactation Consultation Note  Patient Name: Sara Boyd [id number] Date: 04/27/2019 Reason for consult: Follow-up assessment Assisted mom with latching baby to breast at 1400, baby would latch but suck a few times and pull off, sleepy, gaggy, attempted in side lying position on right breast as mom states baby doesn't latch well on this breast, states latches well on left, at next attempt mom desires to bottle feed formula and pump breasts, she plans on purchasing an electric breast pump for home use, Symphony breast pump set up and mom instructed how to pump in initiation mode, obtained a few drops after session, mom shown how to clean breast pump and given palmolive soap and basin to clean, she states she will attempt at breast at next feeding    Maternal Data Formula Feeding for Exclusion: No Has patient been taught Hand Expression?: Yes Does the patient have breastfeeding experience prior to this delivery?: No  Feeding Feeding Type: Bottle Fed - Formula  LATCH Score Latch: Repeated attempts needed to sustain latch, nipple held in mouth throughout feeding, stimulation needed to elicit sucking reflex.  Audible Swallowing: A few with stimulation  Type of Nipple: Everted at rest and after stimulation  Comfort (Breast/Nipple): Soft / non-tender  Hold (Positioning): Assistance needed to correctly position infant at breast and maintain latch.  LATCH Score: 7  Interventions Interventions: DEBP  Lactation Tools Discussed/Used WIC Program: No Pump Review: Setup, frequency, and cleaning;Milk Storage Initiated by:: Chaya Jan RNC IBCLC Date initiated:: 04/27/19   Consult Status Consult Status: Follow-up Date: 04/28/19 Follow-up type: In-patient    Ferol Luz 04/27/2019, 5:43 PM

## 2019-04-27 NOTE — Anesthesia Postprocedure Evaluation (Signed)
Anesthesia Post Note  Patient: Sara Boyd  Procedure(s) Performed: AN [facility] [place]  Patient location during evaluation: Mother Baby Anesthesia Type: Epidural Level of consciousness: awake and alert Pain management: pain level controlled Vital Signs Assessment: post-procedure vital signs reviewed and stable Respiratory status: spontaneous breathing, nonlabored ventilation and respiratory function stable Cardiovascular status: stable Postop Assessment: no headache, no backache and epidural receding Anesthetic complications: no     Last Vitals:  Vitals:   04/26/19 2300 04/27/19 0715  BP: (!) 107/57 113/60  Pulse: 65 72  Resp: 18 18  Temp: 36.9 C 36.9 C  SpO2: 98% 99%    Last Pain:  Vitals:   04/27/19 0715  TempSrc: Oral  PainSc:                  Jerrye Noble

## 2019-04-27 NOTE — Discharge Summary (Signed)
Discharge Summary  Date of Admission: 04/25/2019  Date of Discharge: 04/27/2019  Admitting Diagnosis: Induction of labor at 34w1dfor BMI  Mode of Delivery: normal spontaneous vaginal delivery                 Discharge Diagnosis: No other diagnosis   Intrapartum Procedures: epidural, pitocin augmentation, placement of fetal scalp electrode and placement of intrauterine catheter   Post partum procedures:   Complications: none                      Discharge Day SOAP Note:  Progress Note - Vaginal Delivery  [name] a 28y.o. [phone] PP day 1.5 s/p Vaginal, Spontaneous . Delivery was uncomplicated  Subjective  The patient has the following complaints: has no unusual complaints  Pain is controlled with current medications.   Patient is urinating without difficulty.  She is ambulating well.    Objective  Vital signs: BP 113/60 (BP Location: Right Arm)   Pulse 72   Temp 98.4 F (36.9 C) (Oral)   Resp 18   Ht 5' 4" (1.626 m)   Wt 128.4 kg   LMP 07/21/2018 (Exact Date)   SpO2 99%   Breastfeeding Unknown   BMI 48.58 kg/m   Physical Exam: Gen: NAD Fundus Fundal Tone: Firm  Lochia Amount: Small  Perineum Appearance: Intact     Data Review Labs: CBC Latest Ref Rng & Units 04/25/2019 02/14/2019 10/17/2018  WBC 4.0 - 10.5 K/uL 10.2 6.8 6.4  Hemoglobin 12.0 - 15.0 g/dL 12.1 11.3 12.1  Hematocrit 36.0 - 46.0 % 36.5 35.0 35.3  Platelets 150 - 400 K/uL 220 208 242   O NEG  Assessment/Plan  Active Problems:   Obesity complicating pregnancy in third trimester    Plan for discharge today.   Discharge Instructions: Per After Visit Summary. Activity: Advance as tolerated. Pelvic rest for 6 weeks.  Also refer to After Visit Summary Diet: Regular Medications: Allergies as of 04/27/2019      Reactions   Pineapple Shortness Of Breath   Pt states her throat shuts completely. Anything pineapple, pt states it is  juice, the whole fruit, or flavoring.    Sulfa Antibiotics Shortness Of Breath   Pt states her throat closes as well, tightness.    Latex Itching   Pt states her throat also starts closing.    Vicodin [hydrocodone-acetaminophen] Other (See Comments)   Hallucinations.    Codeine Rash   Pt states it feels like her skin is coming off.    Penicillins Rash   Pt states it feels like her skin is coming off.       Medication List    STOP taking these medications   blood glucose meter kit and supplies Kit   docusate sodium 100 MG capsule Commonly known as: COLACE   ondansetron 4 MG disintegrating tablet Commonly known as: ZOFRAN-ODT   pantoprazole 20 MG tablet Commonly known as: Protonix   vitamin C 250 MG tablet Commonly known as: ASCORBIC ACID   Vitamin D (Ergocalciferol) 1.25 MG (50000 UT) Caps capsule Commonly known as: DRISDOL     TAKE these medications   prenatal multivitamin Tabs tablet Take 1 tablet by mouth daily at 12 noon.      Outpatient follow up:  Follow-up Information  Harlin Heys, MD Follow up in 6 week(s).   Specialties: Obstetrics and Gynecology, Radiology Contact information: [address] [place] [address] [phone]          Postpartum contraception: Will discuss at first office visit post-partum  Discharged Condition: good  Discharged to: home  Newborn Data: Disposition:home with mother  Apgars: APGAR (1 MIN): 9   APGAR (5 MINS): 9   APGAR (10 MINS):    Baby Feeding: Breast    Finis Bud, M.D. 04/27/2019 9:19 AM

## 2019-04-28 NOTE — Progress Notes (Signed)
Reviewed D/C instructions with pt and family. Pt verbalized understanding of teaching. Discharged to home via W/C. Pt to schedule f/u appt.  

## 2019-05-01 NOTE — Telephone Encounter (Signed)
Please contact to see if she can come in to see myself or Dr. Amalia Hailey for her symptoms.

## 2019-05-02 ENCOUNTER — Encounter: Payer: Self-pay | Admitting: Obstetrics and Gynecology

## 2019-05-02 ENCOUNTER — Other Ambulatory Visit: Payer: Self-pay

## 2019-05-02 ENCOUNTER — Ambulatory Visit (INDEPENDENT_AMBULATORY_CARE_PROVIDER_SITE_OTHER): Payer: Medicaid Other | Admitting: Obstetrics and Gynecology

## 2019-05-02 VITALS — BP 139/82 | HR 58 | Ht 64.0 in | Wt 267.7 lb

## 2019-05-02 DIAGNOSIS — Z8659 Personal history of other mental and behavioral disorders: Secondary | ICD-10-CM | POA: Diagnosis not present

## 2019-05-02 DIAGNOSIS — O99345 Other mental disorders complicating the puerperium: Secondary | ICD-10-CM | POA: Diagnosis not present

## 2019-05-02 DIAGNOSIS — F418 Other specified anxiety disorders: Secondary | ICD-10-CM

## 2019-05-02 NOTE — Progress Notes (Signed)
GYNECOLOGY PROGRESS NOTE  Subjective:    Patient ID: Sara Boyd, female    DOB: 1990/10/17, 28 y.o.   MRN: [id number]  HPI  Patient is a 28 y.o. [phone] female who presents for complaints of significant postpartum anxiety.  She has a history of anxiety and OCD in the past.  She is approximately 6 days postpartum from a vaginal delivery (scheduled induction for elevated BMI).  Patient reports having racing thoughts, is very anxious about everything. Her anxiety started during her labor as her induction took much longer than she thought, and was worried that something was going to happen to her or her baby.  Since being home, she notes she has not eaten anything or slept in 5 days (although does note she just ate something for the first time this morning).  Is worried that she will develop postpartum psychosis after reading about it on the internet.  Sara Boyd also has concerns about her heart rate, worried that it is too low and that something is wrong with her.  She reports agitation and and fidgeting, also reports sometimes gagging due to excessive worry. Lastly she notes that this time marks around the anniversary where she lost her dad. She resumed her anxiety medications ~ 5 days ago (Prozac 20 mg and her Xanax) but notes she had to stop breastfeeding due to the use of the medications.  Reports that her partner is not very supportive at times as he "doesn't really believe that mental illness is real".    GAD 7 : Generalized Anxiety Score 05/02/2019  Nervous, Anxious, on Edge 3  Control/stop worrying 3  Worry too much - different things 3  Trouble relaxing 3  Restless 3  Easily annoyed or irritable 2  Afraid - awful might happen 3  Total GAD 7 Score 20  Anxiety Difficulty Very difficult      The following portions of the patient's history were reviewed and updated as appropriate: allergies, current medications, past family history, past medical history, past social  history, past surgical history and problem list.  Review of Systems Pertinent items noted in HPI and remainder of comprehensive ROS otherwise negative.   Objective:   Blood pressure 139/82, pulse (!) 58, height 5\' 4"  (1.626 m), weight 267 lb 11.2 oz (121.4 kg), unknown if currently breastfeeding. General appearance: alert, tearful at points of encounter, very anxious Psychologic: Normal speech, racing thoughts, feelings of impending doom, fidgeting/agitated with rapid left leg movement.    Assessment:   Postpartum anxiety History of OCD Postpartum state  Plan:   1. Lengthy discussion had with patient regarding her symptoms. Her main concern is that she fears developing postpartum psychosis and requiring admission and having to be away from her family. Discussed fears/concerns with patient, reassuring that even if she did require admission, it would be for her best interest to get well so that she can better take Boyd of her family.  Currently reassured that she does not have postpartum psychosis. Commended patient on initiation of her medications in a timely fashion, however anti-anxiety/anti-depressants can take up to 2 weeks to see a change.  Advised that once she reaches Day #7, she can increase her Prozac to 40 mg daily.  Can continue use of her Xanax for now until she has had longer to be on the Prozac. Also discussed options for counseling.  Given several resources for counselors, walk-in clinics, and crisis hotlines.  Also had Medicaid Social Worker (Angie Elenore Rota) speak with patient about  coordination with a counselor through [facility] and the Health Department.   2. Postpartum state- physically ok, no issues with bleeding or pain.   3. Patient to f/u in 2-3 weeks fo re-evaluation of symptoms.    A total of 20 minutes were spent face-to-face with the patient during this encounter and over half of that time involved counseling and coordination of Boyd.   Hildred Laser, MD [facility]

## 2019-05-02 NOTE — Progress Notes (Signed)
Pt is present for anxiety symptoms. Pt stated that it is really hard to function some days and is concerned about her resting heart rate has dropped.  GAD-7=20.

## 2019-05-02 NOTE — Patient Instructions (Signed)
Perinatal Anxiety °When a woman feels excessive tension or worry (anxiety) during pregnancy or during the first 12 months after she gives birth, she has a condition called perinatal anxiety. Anxiety can interfere with work, school, relationships, and other everyday activities. If it is not managed properly, it can also cause problems in the mother and her baby.  °If you are pregnant and you have symptoms of an anxiety disorder, it is important to talk with your health care provider. °What are the causes? °The exact cause of this condition is not known. Hormonal changes during and after pregnancy may play a role in causing perinatal anxiety. °What increases the risk? °You are more likely to develop this condition if: °· You have a personal or family history of depression, anxiety, or mood disorders. °· You experience a stressful life event during pregnancy, such as the death of a loved one. °· You have a lot of regular life stress, such as being a single parent. °· You have thyroid problems. °What are the signs or symptoms? °Perinatal anxiety can be different for everyone. It may include: °· Panic attacks (panic disorder). These are intense episodes of fear or discomfort that may also cause sweating, nausea, shortness of breath, or fear of dying. They usually last 5-15 minutes. °· Reliving an upsetting (traumatic) event through distressing thoughts, dreams, or flashbacks (post-traumatic stress disorder, or PTSD). °· Excessive worry about multiple problems (generalized anxiety disorder). °· Fear and stress about leaving certain people or loved ones (separation anxiety). °· Performing repetitive tasks (compulsions) to relieve stress or worry (obsessive compulsive disorder, or OCD). °· Fear of certain objects or situations (phobias). °· Excessive worrying, such as a constant feeling that something bad is going to happen. °· Inability to relax. °· Difficulty concentrating. °· Sleep problems. °· Frequent nightmares or  disturbing thoughts. °How is this diagnosed? °This condition is diagnosed based on a physical exam and mental evaluation. In some cases, your health care provider may use an anxiety screening tool. These tools include a list of questions that can help a health care provider diagnose anxiety. Your health care provider may refer you to a mental health expert who specializes in anxiety. °How is this treated? °This condition may be treated with: °· Medicines. Your health care provider will only give you medicines that have been proven safe for pregnancy and breastfeeding. °· Talk therapy with a mental health professional to help change your patterns of thinking (cognitive behavioral therapy). °· Mindfulness-based stress reduction. °· Other relaxation therapies, such as deep breathing or guided muscle relaxation. °· Support groups. °Follow these instructions at home: °Lifestyle °· Do not use any products that contain nicotine or tobacco, such as cigarettes and e-cigarettes. If you need help quitting, ask your health care provider. °· Do not use alcohol when you are pregnant. After your baby is born, limit alcohol intake to no more than 1 drink a day. One drink equals 12 oz of beer, 5 oz of wine, or 1½ oz of hard liquor. °· Consider joining a support group for new mothers. Ask your health care provider for recommendations. °· Take good care of yourself. Make sure you: °? Get plenty of sleep. If you are having trouble sleeping, talk with your health care provider. °? Eat a healthy diet. This includes plenty of fruits and vegetables, whole grains, and lean proteins. °? Exercise regularly, as told by your health care provider. Ask your health care provider what exercises are safe for you. °General instructions °· Take over-the-counter   and prescription medicines only as told by your health care provider. °· Talk with your partner or family members about your feelings during pregnancy. Share any concerns or fears that you may  have. °· Ask for help with tasks or chores when you need it. Ask friends and family members to provide meals, watch your children, or help with cleaning. °· Keep all follow-up visits as told by your health care provider. This is important. °Contact a health care provider if: °· You (or people close to you) notice that you have any symptoms of anxiety or depression. °· You have anxiety and your symptoms get worse. °· You experience side effects from medicines, such as nausea or sleep problems. °Get help right away if: °· You feel like hurting yourself, your baby, or someone else. °If you ever feel like you may hurt yourself or others, or have thoughts about taking your own life, get help right away. You can go to your nearest emergency department or call: °· Your local emergency services (911 in the [place] °· A suicide crisis helpline, such as the National Suicide Prevention Lifeline at 1-800-273-8255. This is open 24 hours a day. °Summary °· Perinatal anxiety is when a woman feels excessive tension or worry during pregnancy or during the first 12 months after she gives birth. °· Perinatal anxiety may include panic attacks, post-traumatic stress disorder, separation anxiety, phobias, or generalized anxiety. °· Perinatal anxiety can cause physical health problems in the mother and baby if not properly managed. °· This condition is treated with medicines, talk therapy, stress reduction therapies, or a combination of two or more treatments. °· Talk with your partner or family members about your concerns or fears. Do not be afraid to ask for help. °This information is not intended to replace advice given to you by your health care provider. Make sure you discuss any questions you have with your health care provider. °Document Released: 06/23/2016 Document Revised: 04/29/2017 Document Reviewed: 06/23/2016 °Elsevier Patient Education © 2020 [employer] ° °

## 2019-05-23 ENCOUNTER — Ambulatory Visit: Payer: Medicaid Other | Admitting: Obstetrics and Gynecology

## 2019-06-13 ENCOUNTER — Ambulatory Visit (INDEPENDENT_AMBULATORY_CARE_PROVIDER_SITE_OTHER): Payer: Medicaid Other | Admitting: Obstetrics and Gynecology

## 2019-06-13 ENCOUNTER — Other Ambulatory Visit: Payer: Self-pay

## 2019-06-13 ENCOUNTER — Encounter: Payer: Self-pay | Admitting: Obstetrics and Gynecology

## 2019-06-13 DIAGNOSIS — Z3202 Encounter for pregnancy test, result negative: Secondary | ICD-10-CM | POA: Diagnosis not present

## 2019-06-13 DIAGNOSIS — Z3046 Encounter for surveillance of implantable subdermal contraceptive: Secondary | ICD-10-CM

## 2019-06-13 DIAGNOSIS — R399 Unspecified symptoms and signs involving the genitourinary system: Secondary | ICD-10-CM

## 2019-06-13 DIAGNOSIS — F418 Other specified anxiety disorders: Secondary | ICD-10-CM

## 2019-06-13 DIAGNOSIS — Z8639 Personal history of other endocrine, nutritional and metabolic disease: Secondary | ICD-10-CM

## 2019-06-13 DIAGNOSIS — R5383 Other fatigue: Secondary | ICD-10-CM

## 2019-06-13 DIAGNOSIS — E538 Deficiency of other specified B group vitamins: Secondary | ICD-10-CM

## 2019-06-13 DIAGNOSIS — O99345 Other mental disorders complicating the puerperium: Secondary | ICD-10-CM

## 2019-06-13 LAB — POCT URINALYSIS DIPSTICK
Bilirubin, UA: NEGATIVE
Blood, UA: NEGATIVE
Glucose, UA: NEGATIVE
Ketones, UA: NEGATIVE
Nitrite, UA: NEGATIVE
Protein, UA: POSITIVE — AB
Spec Grav, UA: 1.025 (ref 1.010–1.025)
Urobilinogen, UA: 0.2 E.U./dL
pH, UA: 6 (ref 5.0–8.0)

## 2019-06-13 LAB — POCT URINE PREGNANCY: Preg Test, Ur: NEGATIVE

## 2019-06-13 NOTE — Progress Notes (Signed)
Pt present for postpartum care. Pt had sexual intercourse lately and used a condom for protect. Pt would like to try birth control. UPT-neg. Pt is not breastfeeding. Pt stopped taking Xanax and Prozac due to weight gain and pt stated that she felt a lot better,  EPDS=8. GAD-7=4.

## 2019-06-13 NOTE — Patient Instructions (Signed)
NEXPLANON PLACEMENT POST-PROCEDURE INSTRUCTIONS ° °1. You may take Ibuprofen, Aleve or Tylenol for pain if needed.  Pain should resolve within in 24 hours. ° °2. You may have intercourse after 24 hours.  If you using this for birth control, it is effective immediately. ° °3. You need to call if you have any fever, heavy bleeding, or redness at insertion site. Irregular bleeding is common the first several months after having a Nexplanonplaced. You do not need to call for this reason unless you are concerned. ° °4. Shower or bathe as normal.  You can remove the bandage after 24 hours. ° °

## 2019-06-13 NOTE — Progress Notes (Signed)
OBSTETRICS POSTPARTUM CLINIC PROGRESS NOTE  Subjective:     Sara Boyd is a 29 y.o. [phone] female who presents for a postpartum visit. She is 6 weeks postpartum following a spontaneous vaginal delivery. I have fully reviewed the prenatal and intrapartum course. The delivery was at 46 gestational weeks.  Anesthesia: epidural. Postpartum course has been complicated by severe postpartum anxiety, was initiated on Prozac and Xanax.  Notes that she stopped the medication ~ 1 week ago due to issues with weight gain.  Has been doing pretty good off medications. Baby's course has been well. Baby is feeding by bottle - [place]. Bleeding: patient has not resumed menses, with No LMP recorded.. Bowel function is normal. Bladder function is normal. Patient is sexually active, used condoms. Contraception method desired is Nexplanon. Postpartum depression screening: negative, EPDS = 8.  GAD-7 score is 4.   The following portions of the patient's history were reviewed and updated as appropriate: allergies, current medications, past family history, past medical history, past social history, past surgical history and problem list.  Review of Systems A comprehensive review of systems was negative except for: Constitutional: positive for fatigue but does not think it is due to having a newborn.   Objective:    BP 115/77   Pulse 76   Ht 5\' 4"  (1.626 m)   Wt 271 lb 11.2 oz (123.2 kg)   Breastfeeding No   BMI 46.64 kg/m   General:  alert and no distress   Breasts:  inspection negative, no nipple discharge or bleeding, no masses or nodularity palpable  Lungs: clear to auscultation bilaterally  Heart:  regular rate and rhythm, S1, S2 normal, no murmur, click, rub or gallop  Abdomen: soft, non-tender; bowel sounds normal; no masses,  no organomegaly.     Vulva:  normal  Vagina: normal vagina, no discharge, exudate, lesion, or erythema  Cervix:  no cervical motion tenderness and  no lesions  Corpus: normal size, contour, position, consistency, mobility, non-tender  Adnexa:  normal adnexa and no mass, fullness, tenderness  Rectal Exam: Not performed.         Labs:  Lab Results  Component Value Date   HGB 12.1 04/25/2019     Assessment:   Postpartum care following vaginal delivery Postpartum anxiety History of vitamin D deficiency Vitamin B12 deficiency Other fatigue  Plan:    1. Contraception: Nexplanon.  Desires insertion today. See procedure note below.  2. Patient discontinued the Prozac and Xanax for her severe anxiety, notes overall feeling better. Was concerned about side effects of weight gain.  Is working on losing weight, changing diet.  3. Patient notes fatigue, does not think it is due to caring for her newborn. Depression has improved so likely onto associated. Had a h/o Vitamin D deficiency in the past.  Will check TSH, Vit D and B12 levels.  4. Follow up in: 3 months for annual exam, or sooner as needed.       GYNECOLOGY OFFICE PROCEDURE NOTE  Shrita Thien is a 29 y.o. [phone] here for Nexplanon insertion.  Last pap smear was on 10/18/2018 and was normal.  No other gynecologic concerns.   Nexplanon Insertion Procedure Patient identified, informed consent performed, consent signed.   Patient does understand that irregular bleeding is a very common side effect of this medication. She was advised to have backup contraception for one week after placement. Pregnancy test in clinic today was negative.  Appropriate time out taken.  Patient's  left arm was prepped and draped in the usual sterile fashion. The ruler used to measure and mark insertion area.  Patient was prepped with alcohol swab and then injected with 3 ml of 1% lidocaine.  She was prepped with betadine, Nexplanon removed from packaging,  Device confirmed in needle, then inserted full length of needle and withdrawn per handbook instructions. Nexplanon was able to palpated  in the patient's arm; patient palpated the insert herself. There was minimal blood loss.  Patient insertion site covered with guaze and a pressure bandage to reduce any bruising.  The patient tolerated the procedure well and was given post procedure instructions.     EXP: 08/09/2021 LOT: [id number]    Hildred Laser, MD [facility]

## 2019-06-19 ENCOUNTER — Other Ambulatory Visit: Payer: Medicaid Other

## 2019-08-28 ENCOUNTER — Encounter: Payer: Self-pay | Admitting: Internal Medicine

## 2019-08-31 ENCOUNTER — Other Ambulatory Visit: Payer: Self-pay

## 2019-08-31 MED ORDER — ESTRADIOL 2 MG PO TABS: 2.0000 mg | ORAL_TABLET | Freq: Every day | ORAL | 0 refills | Status: DC

## 2019-08-31 NOTE — Telephone Encounter (Signed)
Spoke to pt concerning her bleeding w/Nexplanon and given information by [facility]. Pt stated that she would like to come in on Monday to have the Nexplanon removed. Medication estradiol 2 mg was given to pt to help with her bleeding over the weekend.

## 2019-09-03 ENCOUNTER — Ambulatory Visit: Payer: Medicaid Other | Admitting: Obstetrics and Gynecology

## 2019-09-05 ENCOUNTER — Ambulatory Visit: Payer: Medicaid Other | Admitting: Obstetrics and Gynecology

## 2019-09-07 ENCOUNTER — Encounter: Payer: Medicaid Other | Admitting: Obstetrics and Gynecology

## 2019-09-11 ENCOUNTER — Encounter: Payer: Medicaid Other | Admitting: Obstetrics and Gynecology

## 2019-09-18 ENCOUNTER — Encounter: Payer: Medicaid Other | Admitting: Obstetrics and Gynecology

## 2019-09-26 ENCOUNTER — Encounter: Payer: Medicaid Other | Admitting: Obstetrics and Gynecology

## 2019-11-09 DIAGNOSIS — F419 Anxiety disorder, unspecified: Secondary | ICD-10-CM

## 2019-11-09 DIAGNOSIS — F32A Depression, unspecified: Secondary | ICD-10-CM

## 2019-11-13 MED ORDER — VENLAFAXINE HCL ER 75 MG PO CP24: 75.0000 mg | ORAL_CAPSULE | Freq: Every day | ORAL | 3 refills | Status: DC

## 2019-11-21 ENCOUNTER — Ambulatory Visit: Payer: Medicaid Other | Admitting: Obstetrics and Gynecology

## 2019-11-22 ENCOUNTER — Other Ambulatory Visit: Payer: Self-pay

## 2019-11-22 MED ORDER — VENLAFAXINE HCL ER 37.5 MG PO CP24: 37.5000 mg | ORAL_CAPSULE | Freq: Every day | ORAL | 0 refills | Status: DC

## 2019-12-10 ENCOUNTER — Encounter: Payer: Self-pay | Admitting: Obstetrics and Gynecology

## 2019-12-11 ENCOUNTER — Encounter: Payer: Self-pay | Admitting: Obstetrics and Gynecology

## 2019-12-11 ENCOUNTER — Ambulatory Visit (INDEPENDENT_AMBULATORY_CARE_PROVIDER_SITE_OTHER): Payer: Medicaid Other | Admitting: Obstetrics and Gynecology

## 2019-12-11 VITALS — BP 120/87 | HR 87 | Ht 64.0 in | Wt 287.1 lb

## 2019-12-11 DIAGNOSIS — R5383 Other fatigue: Secondary | ICD-10-CM

## 2019-12-11 DIAGNOSIS — F329 Major depressive disorder, single episode, unspecified: Secondary | ICD-10-CM | POA: Diagnosis not present

## 2019-12-11 DIAGNOSIS — Z3046 Encounter for surveillance of implantable subdermal contraceptive: Secondary | ICD-10-CM | POA: Diagnosis not present

## 2019-12-11 DIAGNOSIS — F419 Anxiety disorder, unspecified: Secondary | ICD-10-CM | POA: Diagnosis not present

## 2019-12-11 DIAGNOSIS — F32A Depression, unspecified: Secondary | ICD-10-CM

## 2019-12-11 NOTE — Progress Notes (Signed)
GYNECOLOGY PROGRESS NOTE  Subjective:    Patient ID: Sara Boyd, female    DOB: 12-25-1990, 29 y.o.   MRN: [id number]  HPI  Patient is a 29 y.o. [phone] female who presents for Nexplanon removal.  She notes a history of abnormal bleeding since it's insertion in February.  She has also been dealing with significant mood issues (postpartum anxiety and depression, which she does not feel the Nexplanon is helping). She has tried supplemental estrogen to stop the bleeding, which helped for a short time, but the bleeding returned.    She is currently on Effexor 75 mg for her mood symptoms (has had significant postpartum anxiety and depression since birth of her child in December), changed from Prozac due to side effects (had also previously tried Zoloft in the past, but did not feel it was effective). Has been on current regimen for 3 weeks.  Wonders if it is ok if she increases the dose.  She does note some mild side effects of occasional dizziness, however also reports that she is not drinking enough fluids during the day.  She is still waiting on her referral to a therapist, sites insurance as being the issue, but is working on changing her plan.   Lastly, Sara Boyd complains of fatigue and lethargy for the past several months. Wonders if she needs to have her Vitamin D levels checked.    The following portions of the patient's history were reviewed and updated as appropriate: allergies, current medications, past family history, past medical history, past social history, past surgical history and problem list.  Review of Systems Pertinent items noted in HPI and remainder of comprehensive ROS otherwise negative.   Objective:   Blood pressure 120/87, pulse 87, height 5\' 4"  (1.626 m), weight 287 lb 1.6 oz (130.2 kg), not currently breastfeeding. General appearance: alert and no distress Neurologic: Grossly normal  Psychologic: normal affect, normal speech. No SI/HI.     GAD 7  : Generalized Anxiety Score 12/11/2019 11/14/2019 06/13/2019 05/02/2019  Nervous, Anxious, on Edge 2 3 1 3   Control/stop worrying 2 3 1 3   Worry too much - different things 2 3 1 3   Trouble relaxing 1 3 0 3  Restless 1 1 0 3  Easily annoyed or irritable 2 3 0 2  Afraid - awful might happen 2 3 1 3   Total GAD 7 Score 12 19 4 20   Anxiety Difficulty Somewhat difficult Extremely difficult Somewhat difficult Very difficult     Depression screen [facility] 2/9 12/11/2019 11/14/2019 05/04/2018  Decreased Interest 1 - 3  Down, Depressed, Hopeless 2 3 3   PHQ - 2 Score 3 3 6   Altered sleeping 2 - 3  Tired, decreased energy 3 - 3  Change in appetite 2 1 3   Feeling bad or failure about yourself  2 - 3  Trouble concentrating 1 3 3   Moving slowly or fidgety/restless 0 - 2  Suicidal thoughts 0 - 0  PHQ-9 Score 13 - 23  Difficult doing work/chores Somewhat difficult - Very difficult    Assessment:   1. Encounter for Nexplanon removal   2. Fatigue, unspecified type   3. Anxiety and depression    Plan:   1. Nexplanon removed today (see procedure note below). Notes that she may consider an IUD (has used the Mirena IUD in the past with no major issues). Also informed of other non-hormonal options such as Phexxi, Paragard, or cervical diaphragm if she is concerned about hormone use and  mood symptoms.  2. Fatigue - unknown cause. Could be a symptom of her depression, or could be other cause. Will assess Vitamin D and TSH.  3. Anxiety and depression - currently on Effexor 75 mg. Can increase to 150 mg to better control symptoms (Advised on BID dosing instead of once daily dosing to decrease increased risk of side effects at higher dosing).  Pending Psychiatry referral.    Patient to return once she has decided on new method of contraception. Advised on barrier (condom) use, or rhythm method.      GYNECOLOGY OFFICE PROCEDURE NOTE  Sara Boyd is a 29 y.o. [phone] here for Nexplanon removal.    Nexplanon Removal Patient identified, informed consent performed, consent signed.   Appropriate time out taken. Nexplanon site identified.  Area prepped in usual sterile fashon. One ml of 1% lidocaine was used to anesthetize the area at the distal end of the implant. A small stab incision was made right beside the implant on the distal portion.  The Nexplanon rod was grasped using hemostats and removed without difficulty.  There was minimal blood loss. There were no complications.  3 ml of 1% lidocaine was injected around the incision for post-procedure analgesia.  Steri-strips were applied over the small incision.  A pressure bandage was applied to reduce any bruising.  The patient tolerated the procedure well and was given post procedure instructions.  Patient is unsure of contraceptive desires at this time. Advised on barrier method or rhythm method until decision is made.     A total of 15 minutes were spent face-to-face with the patient during this encounter and over half of that time dealt with counseling and coordination of care.  Hildred Laser, MD [facility]

## 2019-12-11 NOTE — Progress Notes (Signed)
Pt present for Nexplanon removal and discuss issues of fatigue and tiredness. Pt stated that she is always tired and have no energy. PHQ-9=13. GAD-7=12.

## 2019-12-12 LAB — TSH: TSH: 0.985 u[IU]/mL (ref 0.450–4.500)

## 2019-12-12 LAB — VITAMIN D 25 HYDROXY (VIT D DEFICIENCY, FRACTURES): Vit D, 25-Hydroxy: 19.9 ng/mL — ABNORMAL LOW (ref 30.0–100.0)

## 2019-12-13 MED ORDER — VITAMIN D (ERGOCALCIFEROL) 1.25 MG (50000 UNIT) PO CAPS: 50000.0000 [IU] | ORAL_CAPSULE | ORAL | 0 refills | Status: DC

## 2019-12-13 NOTE — Addendum Note (Signed)
Addended by: Fabian November on: 12/13/2019 05:51 PM   Modules accepted: Orders

## 2020-01-04 ENCOUNTER — Other Ambulatory Visit: Payer: Self-pay | Admitting: Obstetrics and Gynecology

## 2020-01-16 ENCOUNTER — Other Ambulatory Visit: Payer: Self-pay | Admitting: Obstetrics and Gynecology

## 2020-04-20 ENCOUNTER — Other Ambulatory Visit: Payer: Self-pay | Admitting: Obstetrics and Gynecology

## 2020-04-21 NOTE — Telephone Encounter (Signed)
Last visit, I increased her dose from 75 mg to 150 mg.  This refill request is for the 75 mg dosing.  Did she go up on the medication or no?

## 2020-07-31 ENCOUNTER — Other Ambulatory Visit: Payer: Self-pay | Admitting: Obstetrics and Gynecology

## 2020-07-31 NOTE — Telephone Encounter (Signed)
Patient needs an annual prior to next refill request.  Can schedule within the next 6 months.

## 2020-08-01 NOTE — Telephone Encounter (Signed)
Called pt back. Pt did not answer LM via VM that I would send her message to Dr. Valentino Saxon to verify which dose of Effexor do she needs to take the 150 mg or 75 mg. Informed pt via VM that would will contact her about the change or if its after hours contact her via mychart.

## 2020-08-01 NOTE — Telephone Encounter (Signed)
Please verify if the pt is taking 150 mg or 75 mg of venlafaxine. Thanks [employer]

## 2020-08-01 NOTE — Telephone Encounter (Signed)
Hello Sara Boyd, Will you please contact pt for annual exam and medication refill in 6 months. Thanks [employer]

## 2020-08-08 NOTE — Telephone Encounter (Signed)
Completed via mychart

## 2020-09-04 ENCOUNTER — Other Ambulatory Visit: Payer: Self-pay

## 2020-09-04 MED ORDER — VENLAFAXINE HCL ER 75 MG PO CP24: 75.0000 mg | ORAL_CAPSULE | Freq: Every day | ORAL | 3 refills | Status: DC

## 2020-10-15 ENCOUNTER — Encounter: Payer: Medicaid Other | Admitting: Obstetrics and Gynecology

## 2020-10-15 DIAGNOSIS — Z01419 Encounter for gynecological examination (general) (routine) without abnormal findings: Secondary | ICD-10-CM

## 2020-10-29 ENCOUNTER — Encounter: Payer: Self-pay | Admitting: Obstetrics and Gynecology

## 2020-12-04 ENCOUNTER — Encounter: Payer: Medicaid Other | Admitting: Obstetrics and Gynecology

## 2020-12-04 ENCOUNTER — Encounter: Payer: Self-pay | Admitting: Obstetrics and Gynecology

## 2020-12-09 ENCOUNTER — Other Ambulatory Visit: Payer: Self-pay | Admitting: Obstetrics and Gynecology

## 2021-01-07 ENCOUNTER — Other Ambulatory Visit: Payer: Self-pay

## 2021-01-07 ENCOUNTER — Ambulatory Visit (INDEPENDENT_AMBULATORY_CARE_PROVIDER_SITE_OTHER): Payer: Medicaid Other | Admitting: Obstetrics and Gynecology

## 2021-01-07 ENCOUNTER — Encounter: Payer: Self-pay | Admitting: Obstetrics and Gynecology

## 2021-01-07 VITALS — BP 130/79 | HR 78 | Ht 64.0 in | Wt 305.5 lb

## 2021-01-07 DIAGNOSIS — R5383 Other fatigue: Secondary | ICD-10-CM

## 2021-01-07 DIAGNOSIS — F411 Generalized anxiety disorder: Secondary | ICD-10-CM | POA: Diagnosis not present

## 2021-01-07 DIAGNOSIS — Z8639 Personal history of other endocrine, nutritional and metabolic disease: Secondary | ICD-10-CM

## 2021-01-07 DIAGNOSIS — N63 Unspecified lump in unspecified breast: Secondary | ICD-10-CM

## 2021-01-07 MED ORDER — VENLAFAXINE HCL ER 75 MG PO CP24: 75.0000 mg | ORAL_CAPSULE | Freq: Every day | ORAL | 1 refills | Status: DC

## 2021-01-07 MED ORDER — VITAMIN D (ERGOCALCIFEROL) 50000 UNITS PO CAPS: 1.0000 | ORAL_CAPSULE | ORAL | 1 refills | Status: DC

## 2021-01-07 NOTE — Progress Notes (Signed)
    GYNECOLOGY PROGRESS NOTE  Subjective:    Patient ID: Sara Boyd, female    DOB: [birth year], 30 y.o.   MRN: [id number]  HPI  Patient is a 30 y.o. [phone] female who presents for medication refill.  She currently uses Effexor for management of her anxiety symptoms.  Notes that when using the medication she feels approximately 80% back to her normal self.  Is overall happy with the medication.  Denies any adverse side effects.   Additionally patient is complaining of fatigue.  She reports that she has been feeling this way for the past several weeks.  She does have a history of vitamin D deficiency, and typically feels this way when her levels are low.  However she does note that she has been working a lot and this may be contributing as well.  Axillary Stevee has concerns regarding a possible breast mass.  Notes that the mass has been there for several weeks and the left upper breast.  The area is not tender, and denies any redness to the area.  Also denies nipple discharge.  The following portions of the patient's history were reviewed and updated as appropriate: allergies, current medications, past family history, past medical history, past social history, past surgical history, and problem list.  Review of Systems Pertinent items noted in HPI and remainder of comprehensive ROS otherwise negative.   Objective:   Blood pressure 130/79, pulse 78, height 5\' 4"  (1.626 m), weight (!) 305 lb 8 oz (138.6 kg), last menstrual period 12/23/2020, not currently breastfeeding.  Body mass index is 52.44 kg/m. General appearance: alert, cooperative, appears stated age, and no distress Breasts: right breast normal without mass, skin or nipple changes or axillary nodes, left breast with small mobile nodular region (in subcutaneous tissue), nontender, ~ 1 cm at ~ 12 o'clock in upper region.  No skin or nipple changes or axillary nodes. Neurologic: Grossly normal   Assessment:   1.  Generalized anxiety disorder   2. Fatigue, unspecified type   3. History of vitamin D deficiency   4. Breast mass in female     Plan:   1. Generalized anxiety disorder - Refill given on Effexor.  Given 41-month supply.  Will refill for the year at patient's annual exam in 3 months (has rescheduled several times).  2. Fatigue, unspecified type -Unclear cause, however patient does have a history of vitamin D deficiency.  Last levels checked approximately 1 year ago.  Unable to perform labs today due to lateness of visit and lab closing.  Will prescribe vitamin D.  We will also recheck levels in 3 months.  If fatigue still present, we will also perform further assessment for fatigue.  Encouraged getting adequate rest, increasing hydration, engage in exercise and eating a balanced diet.   3. Vitamin D deficiency -See above, will refill high-dose vitamin D.  Patient to take weekly for 12 weeks.  4. Breast mass in female -Exam performed, no concerns for malignancy.  Possible subcutaneous nodule or small fatty tissue mass noted.  Encourage patient to continue to monitor.  If growth is noted or other characteristics change, can refer for ultrasound for further evaluation.  2-month, MD [facility]

## 2021-01-07 NOTE — Patient Instructions (Signed)
Vitamin D Deficiency Vitamin D deficiency is when your body does not have enough vitamin D. Vitamin D is important to your body because: It helps your body use other minerals. It helps to keep your bones strong and healthy. It may help to prevent some diseases. It helps your heart and other muscles work well. Not getting enough vitamin D can make your bones soft. It can also cause other health problems. What are the causes? This condition may be caused by: Not eating enough foods that contain vitamin D. Not getting enough sun. Having diseases that make it hard for your body to absorb vitamin D. Having a surgery in which a part of the stomach or a part of the small intestine is removed. Having kidney disease or liver disease. What increases the risk? You are more likely to get this condition if: You are older. You do not spend much time outdoors. You live in a nursing home. You have had broken bones. You have weak or thin bones (osteoporosis). You have a disease or condition that changes how your body absorbs vitamin D. You have dark skin. You take certain medicines. You are overweight or obese. What are the signs or symptoms? In mild cases, there may not be any symptoms. If the condition is very bad, symptoms may include: Bone pain. Muscle pain. Falling often. Broken bones caused by a minor injury. How is this treated? Treatment may include taking supplements as told by your doctor. Your doctor will tell you what dose is best for you. Supplements may include: Vitamin D. Calcium. Follow these instructions at home: Eating and drinking  Eat foods that contain vitamin D, such as: Dairy products, cereals, or juices with added vitamin D. Check the label. Fish, such as salmon or trout. Eggs. Oysters. Mushrooms. The items listed above may not be a complete list of what you can eat and drink. Contact a dietitian for more options. General instructions Take medicines and  supplements only as told by your doctor. Get regular, safe exposure to natural sunlight. Do not use a tanning bed. Maintain a healthy weight. Lose weight if needed. Keep all follow-up visits as told by your doctor. This is important. How is this prevented? You can get vitamin D by: Eating foods that naturally contain vitamin D. Eating or drinking products that have vitamin D added to them, such as cereals, juices, and milk. Taking vitamin D or a multivitamin that contains vitamin D. Being in the sun. Your body makes vitamin D when your skin is exposed to sunlight. Your body changes the sunlight into a form of the vitamin that it can use. Contact a doctor if: Your symptoms do not go away. You feel sick to your stomach (nauseous). You throw up (vomit). You poop less often than normal, or you have trouble pooping (constipation). Summary Vitamin D deficiency is when your body does not have enough vitamin D. Vitamin D helps to keep your bones strong and healthy. This condition is often treated by taking a supplement. Your doctor will tell you what dose is best for you. This information is not intended to replace advice given to you by your health care provider. Make sure you discuss any questions you have with your health care provider. Document Revised: 01/02/2018 Document Reviewed: 01/02/2018 Elsevier Patient Education  2022 [employer]  

## 2021-03-16 MED ORDER — VENLAFAXINE HCL ER 75 MG PO TB24: 75.0000 mg | ORAL_TABLET | Freq: Every day | ORAL | 3 refills | Status: DC

## 2021-05-06 ENCOUNTER — Encounter: Payer: Medicaid Other | Admitting: Obstetrics and Gynecology
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
## 2021-09-30 ENCOUNTER — Encounter: Payer: Medicaid Other | Admitting: Obstetrics and Gynecology

## 2021-12-09 ENCOUNTER — Telehealth: Payer: Self-pay | Admitting: Obstetrics and Gynecology

## 2021-12-09 NOTE — Telephone Encounter (Signed)
Patient called to schedule a physical and have been placed on recall list per protocol for merger. Pt states that she spoke with pharmacy about getting a refill on her anti depressant medication for Venlafaxine. Pt states pharmacy sent a prior authorization form in about 2 weeks ago. Pt states pharmacy has not received form. Pt states she still has medication but wanted to get the refill in before medication runs out. Please advise.

## 2021-12-10 NOTE — Telephone Encounter (Signed)
We received a prior authorization that had a different dose on it than what was in her chart. Reached out to her to schedule an annual and verify dosing but never got a response. If she confirms her dosing, we can send it in until her appointment.

## 2021-12-11 MED ORDER — VENLAFAXINE HCL ER 75 MG PO TB24: 75.0000 mg | ORAL_TABLET | Freq: Every day | ORAL | 0 refills | Status: DC

## 2021-12-11 NOTE — Telephone Encounter (Signed)
Refill has been sent.  °

## 2021-12-23 ENCOUNTER — Other Ambulatory Visit: Payer: Self-pay | Admitting: Obstetrics and Gynecology

## 2021-12-23 MED ORDER — VENLAFAXINE HCL ER 75 MG PO CP24: 75.0000 mg | ORAL_CAPSULE | Freq: Every day | ORAL | 0 refills | Status: DC
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
## 2021-12-29 ENCOUNTER — Encounter: Payer: Medicaid Other | Admitting: Obstetrics and Gynecology

## 2021-12-29 ENCOUNTER — Encounter: Payer: Self-pay | Admitting: Obstetrics and Gynecology

## 2021-12-29 DIAGNOSIS — Z01419 Encounter for gynecological examination (general) (routine) without abnormal findings: Secondary | ICD-10-CM

## 2021-12-29 DIAGNOSIS — Z1159 Encounter for screening for other viral diseases: Secondary | ICD-10-CM

## 2022-02-01 ENCOUNTER — Other Ambulatory Visit: Payer: Self-pay

## 2022-02-19 ENCOUNTER — Encounter: Payer: Self-pay | Admitting: Obstetrics and Gynecology

## 2022-02-23 ENCOUNTER — Encounter: Payer: Self-pay | Admitting: Obstetrics and Gynecology

## 2022-03-29 ENCOUNTER — Encounter: Payer: Self-pay | Admitting: Advanced Practice Midwife

## 2022-03-29 ENCOUNTER — Ambulatory Visit (INDEPENDENT_AMBULATORY_CARE_PROVIDER_SITE_OTHER): Payer: BC Managed Care – PPO | Admitting: Advanced Practice Midwife

## 2022-03-29 ENCOUNTER — Other Ambulatory Visit (HOSPITAL_COMMUNITY)
Admission: RE | Admit: 2022-03-29 | Discharge: 2022-03-29 | Disposition: A | Discharge status: Home / Self Care | Payer: BC Managed Care – PPO | Source: Ambulatory Visit | Attending: Advanced Practice Midwife | Admitting: Advanced Practice Midwife

## 2022-03-29 VITALS — BP 116/81 | HR 87 | Ht 64.0 in | Wt 319.0 lb

## 2022-03-29 DIAGNOSIS — Z113 Encounter for screening for infections with a predominantly sexual mode of transmission: Secondary | ICD-10-CM | POA: Diagnosis not present

## 2022-03-29 DIAGNOSIS — N898 Other specified noninflammatory disorders of vagina: Secondary | ICD-10-CM

## 2022-03-29 NOTE — Progress Notes (Signed)
Patient ID: Sara Boyd, female   DOB: 1990/10/20, 31 y.o.   MRN: [id number]  Reason for Consult: Vaginitis (Vaginal discharge with itch)   Subjective:  HPI:  Sara Boyd is a 31 y.o. female being seen for STD/vaginitis screening. Her primary symptom is yellow/green vaginal discharge with itching associated for the past week. She denies odor or irritation. She has recently had unprotected IC with a new partner. She occasionally uses condoms. Her most recent PAP was normal in 2020.   Past Medical History:  Diagnosis Date   Anxiety    Chicken pox    Depression    Endometritis    GERD (gastroesophageal reflux disease)    Headache    Migraine    OCD (obsessive compulsive disorder)    OCD (obsessive compulsive disorder)    Retained products of conception following abortion    UTI (urinary tract infection)    Family History  Problem Relation Age of Onset   Cancer Mother        NHL   Hyperlipidemia Mother    Hypertension Mother    COPD Father    Heart disease Father    Hypertension Father    Hyperlipidemia Father    Diabetes Maternal Grandmother    Heart disease Maternal Grandfather    Cancer Paternal Grandmother        breast   Early death Paternal Grandfather    Heart disease Paternal Grandfather    Past Surgical History:  Procedure Laterality Date   BLADDER SURGERY     DILATION AND CURETTAGE OF UTERUS     elective AB   DILATION AND EVACUATION N/A 10/29/2016   Procedure: DILATATION AND EVACUATION;  Surgeon: Nadara Mustard, MD;  Location: [facility] ORS;  Service: Gynecology;  Laterality: N/A;   HERNIA REPAIR     HERNIA REPAIR     2011   TONSILLECTOMY     1998    Short Social History:  Social History   Tobacco Use   Smoking status: Former   Smokeless tobacco: Never  Substance Use Topics   Alcohol use: No    Allergies  Allergen Reactions   Pineapple Shortness Of Breath    Pt states her throat shuts completely. Anything pineapple,  pt states it is juice, the whole fruit, or flavoring.    Sulfa Antibiotics Shortness Of Breath    Pt states her throat closes as well, tightness.    Latex Itching    Pt states her throat also starts closing.    Vicodin [Hydrocodone-Acetaminophen] Other (See Comments)    Hallucinations.    Codeine Rash    Pt states it feels like her skin is coming off.    Penicillins Rash    Pt states it feels like her skin is coming off.     Current Outpatient Medications  Medication Sig Dispense Refill   venlafaxine XR (EFFEXOR-XR) 75 MG 24 hr capsule Take 1 capsule (75 mg total) by mouth daily. 90 capsule 0   No current facility-administered medications for this visit.   Review of Systems  Constitutional:  Negative for chills and fever.  HENT:  Negative for congestion, ear discharge, ear pain, hearing loss, sinus pain and sore throat.   Eyes:  Negative for blurred vision and double vision.  Respiratory:  Negative for cough, shortness of breath and wheezing.   Cardiovascular:  Negative for chest pain, palpitations and leg swelling.  Gastrointestinal:  Negative for abdominal pain, blood in stool, constipation, diarrhea, heartburn, melena, nausea  and vomiting.  Genitourinary:  Negative for dysuria, flank pain, frequency, hematuria and urgency.       Positive for vaginal discharge and itching  Musculoskeletal:  Negative for back pain, joint pain and myalgias.  Skin:  Negative for itching and rash.  Neurological:  Negative for dizziness, tingling, tremors, sensory change, speech change, focal weakness, seizures, loss of consciousness, weakness and headaches.  Endo/Heme/Allergies:  Negative for environmental allergies. Does not bruise/bleed easily.  Psychiatric/Behavioral:  Negative for depression, hallucinations, memory loss, substance abuse and suicidal ideas. The patient is not nervous/anxious and does not have insomnia.        Objective:  Objective   Vitals:   03/29/22 1437  BP: 116/81   Pulse: 87  Weight: (!) 319 lb (144.7 kg)  Height: 5\' 4"  (1.626 m)   Body mass index is 54.76 kg/m. Constitutional: Well nourished, well developed female in no acute distress.  HEENT: normal Skin: Warm and dry.  Cardiovascular: Regular rate and rhythm.   Respiratory:  Normal respiratory effort Psych: Alert and Oriented x3. No memory deficits. Normal mood and affect.  MS: normal gait, normal bilateral lower extremity ROM/strength/stability.  Pelvic exam: Aptima self swabbed   Assessment/Plan:     31 y.o. G4 P3013 routine STD testing, possible vaginitis  Aptima: STD/vaginitis Blood work: Hep/RPR/HIV panel Follow up as needed after labs resutl Return to office at earliest convenience for annual with PAP   34 CNM [facility] 03/29/2022, 2:58 PM

## 2022-03-31 LAB — CERVICOVAGINAL ANCILLARY ONLY
Bacterial Vaginitis (gardnerella): POSITIVE — AB
Candida Glabrata: NEGATIVE
Candida Vaginitis: NEGATIVE
Chlamydia: POSITIVE — AB
Comment: NEGATIVE
Comment: NEGATIVE
Comment: NEGATIVE
Comment: NEGATIVE
Comment: NEGATIVE
Comment: NORMAL
Neisseria Gonorrhea: NEGATIVE
Trichomonas: NEGATIVE

## 2022-04-01 LAB — HEP, RPR, HIV PANEL
HIV Screen 4th Generation wRfx: NONREACTIVE
Hepatitis B Surface Ag: NEGATIVE
RPR Ser Ql: NONREACTIVE

## 2022-04-04 ENCOUNTER — Other Ambulatory Visit: Payer: Self-pay | Admitting: Advanced Practice Midwife

## 2022-04-04 DIAGNOSIS — N760 Acute vaginitis: Secondary | ICD-10-CM

## 2022-04-04 DIAGNOSIS — B379 Candidiasis, unspecified: Secondary | ICD-10-CM

## 2022-04-04 DIAGNOSIS — A749 Chlamydial infection, unspecified: Secondary | ICD-10-CM

## 2022-04-04 MED ORDER — AZITHROMYCIN 500 MG PO TABS: 1000.0000 mg | ORAL_TABLET | Freq: Once | ORAL | 0 refills | Status: AC

## 2022-04-04 MED ORDER — FLUCONAZOLE 150 MG PO TABS: 150.0000 mg | ORAL_TABLET | Freq: Once | ORAL | 1 refills | Status: AC

## 2022-04-04 MED ORDER — METRONIDAZOLE 500 MG PO TABS: 500.0000 mg | ORAL_TABLET | Freq: Two times a day (BID) | ORAL | 0 refills | Status: AC

## 2022-04-04 NOTE — Progress Notes (Signed)
Rx's sent: Azithromycin- chlamydia, Metronidazole- BV, Diflucan- yeast. Message sent to patient regarding treatment.

## 2022-04-18 ENCOUNTER — Other Ambulatory Visit: Payer: Self-pay | Admitting: Advanced Practice Midwife

## 2022-04-18 DIAGNOSIS — A749 Chlamydial infection, unspecified: Secondary | ICD-10-CM

## 2022-07-19 ENCOUNTER — Other Ambulatory Visit: Payer: Self-pay | Admitting: Obstetrics and Gynecology

## 2022-08-04 ENCOUNTER — Other Ambulatory Visit: Payer: Self-pay | Admitting: Obstetrics and Gynecology

## 2022-08-04 MED ORDER — VENLAFAXINE HCL ER 75 MG PO CP24: 75.0000 mg | ORAL_CAPSULE | Freq: Every day | ORAL | 0 refills | Status: DC

## 2022-08-18 ENCOUNTER — Ambulatory Visit: Payer: BC Managed Care – PPO | Admitting: Obstetrics and Gynecology

## 2022-08-18 ENCOUNTER — Ambulatory Visit: Payer: Medicaid Other | Admitting: Obstetrics and Gynecology

## 2022-09-08 ENCOUNTER — Encounter: Payer: Self-pay | Admitting: Obstetrics and Gynecology

## 2022-09-08 ENCOUNTER — Ambulatory Visit (INDEPENDENT_AMBULATORY_CARE_PROVIDER_SITE_OTHER): Payer: Medicaid Other | Admitting: Obstetrics and Gynecology

## 2022-09-08 VITALS — BP 127/75 | HR 92 | Ht 64.0 in | Wt 323.4 lb

## 2022-09-08 DIAGNOSIS — F32A Depression, unspecified: Secondary | ICD-10-CM

## 2022-09-08 DIAGNOSIS — R5383 Other fatigue: Secondary | ICD-10-CM

## 2022-09-08 DIAGNOSIS — F419 Anxiety disorder, unspecified: Secondary | ICD-10-CM

## 2022-09-08 MED ORDER — VENLAFAXINE HCL ER 75 MG PO CP24: 75.0000 mg | ORAL_CAPSULE | Freq: Every day | ORAL | 3 refills | Status: DC

## 2022-09-08 NOTE — Progress Notes (Signed)
    GYNECOLOGY PROGRESS NOTE  Subjective:    Patient ID: Sara Boyd, female    DOB: 20-May-1990, 32 y.o.   MRN: [id number]  HPI  Patient is a 32 y.o. [phone] female who presents for medication follow up. Needs refill on her Effexor.  Is overdue for wellness exam and needs to be scheduled, however required medication before that visit.   Patient complains that she feels like she has very low energy levels.  Notes that she doesn't feel sad or depressed. Also has gained weight but unsure why as she has modified diet (cut out sugars, sodas, and eating more salads and protein). Is currently up to 323 lbs and notes this is the heaviest she's ever been.   The following portions of the patient's history were reviewed and updated as appropriate:   She  has a past medical history of Anxiety, Chicken pox, Depression, Endometritis, GERD (gastroesophageal reflux disease), Headache, Migraine, OCD (obsessive compulsive disorder), OCD (obsessive compulsive disorder), Retained products of conception following abortion, and UTI (urinary tract infection).  She  has a past surgical history that includes Tonsillectomy; Bladder surgery; Hernia repair; Dilation and curettage of uterus; Dilation and evacuation (N/A, 10/29/2016); and Hernia repair.  She  reports that she has quit smoking. She has never used smokeless tobacco. She reports that she does not drink alcohol and does not use drugs.   Current Outpatient Medications  Medication Instructions   venlafaxine XR (EFFEXOR-XR) 75 mg, Oral, Daily     She is allergic to pineapple, sulfa antibiotics, latex, vicodin [hydrocodone-acetaminophen], codeine, and penicillins..  Review of Systems Pertinent items noted in HPI and remainder of comprehensive ROS otherwise negative.   Objective:   Blood pressure 127/75, pulse 92, height 5\' 4"  (1.626 m), weight (!) 323 lb 7 oz (146.7 kg). Body mass index is 55.52 kg/m. General appearance: alert and no  distress, morbid obesity Neck: no thyromegaly or other masses present Lungs: clear to auscultation bilaterally Heart: regular rate and rhythm, S1, S2 normal, no murmur, click, rub or gallop  Pelvic: deferred Extremities: extremities normal, atraumatic, no cyanosis or edema Neurologic: Grossly normal   Assessment:   1. Anxiety and depression   2. Low energy      Plan:   1. Anxiety and depression -Patient with history of anxiety and depression.  Notes that medication Effexor works well for her.  Does feel sometimes that it numbs her but states she would rather feel like this versus having significant anxiety.  Will refill medication.  2. Low energy -Unclear cause.  Patient notes she has been experiencing low energy as well as unexplained weight gain despite making lifestyle changes.  Will order labs.  Patient does have history of vitamin D deficiency. - TSH - B12 - Vitamin D (25 hydroxy)   Patient overdue for annual exam.  Needs to follow-up.  Advised to schedule within the next 3 months.   Hildred Laser, MD [facility]

## 2022-09-09 ENCOUNTER — Other Ambulatory Visit: Payer: Self-pay | Admitting: Obstetrics and Gynecology

## 2022-09-09 DIAGNOSIS — E559 Vitamin D deficiency, unspecified: Secondary | ICD-10-CM

## 2022-09-09 LAB — VITAMIN D 25 HYDROXY (VIT D DEFICIENCY, FRACTURES): Vit D, 25-Hydroxy: 10.6 ng/mL — ABNORMAL LOW (ref 30.0–100.0)

## 2022-09-09 LAB — VITAMIN B12: Vitamin B-12: 353 pg/mL (ref 232–1245)

## 2022-09-09 LAB — TSH: TSH: 1.27 u[IU]/mL (ref 0.450–4.500)

## 2022-09-09 MED ORDER — VITAMIN D (ERGOCALCIFEROL) 1.25 MG (50000 UNIT) PO CAPS: 50000.0000 [IU] | ORAL_CAPSULE | ORAL | 1 refills | Status: DC

## 2022-09-13 ENCOUNTER — Encounter: Payer: Self-pay | Admitting: Obstetrics and Gynecology

## 2022-09-13 ENCOUNTER — Other Ambulatory Visit: Payer: Self-pay

## 2022-09-13 DIAGNOSIS — E559 Vitamin D deficiency, unspecified: Secondary | ICD-10-CM

## 2022-09-13 DIAGNOSIS — R5383 Other fatigue: Secondary | ICD-10-CM

## 2022-09-13 DIAGNOSIS — Z8639 Personal history of other endocrine, nutritional and metabolic disease: Secondary | ICD-10-CM

## 2022-09-13 MED ORDER — VITAMIN D (ERGOCALCIFEROL) 1.25 MG (50000 UNIT) PO CAPS: 50000.0000 [IU] | ORAL_CAPSULE | ORAL | 1 refills | Status: DC

## 2022-11-19 NOTE — Progress Notes (Signed)
GYNECOLOGY ANNUAL PHYSICAL EXAM PROGRESS NOTE  Subjective:    Sara Boyd is a 32 y.o. [phone] female who presents for an annual exam. The patient is sexually active. The patient participates in regular exercise: no. Has the patient ever been transfused or tattooed?: yes. The patient reports that there is not domestic violence in her life.   The patient has the following complaints today. Reports vaginal odor for the past several weeks.  Notes that it is not a bad smell, just different, more of a musky odor.  Denies vaginal discharge, itching.  Notes that she recently changed her soaps from [employer] to [employer] (frangrance free) to see if this would help but has not. Reports that her menstrual cycles have been irregular over the past year.  Sometimes skips cycles, sometimes has 2 in 1 month (short, lasting for only 2-3 days, but sometimes heavy).  This has been going on for ~ 1 year.  Notes that her energy levels have improved since initiation of the Vitamin D prescription 2 months ago.  Also not feeling the need to sleep as much.  Still has concerns about a left chest nodule.  Area has been present for the past 2 years, feels like it is getting bigger.      Menstrual History: Menarche age: 1 Patient's last menstrual period was 11/17/2022 (approximate). Period Duration (Days): 5-8 Period Pattern: (!) Irregular Menstrual Flow: Heavy Menstrual Control: Maxi pad Menstrual Control Change Freq (Hours): 3 Dysmenorrhea: (!) Moderate Dysmenorrhea Symptoms: Cramping, Nausea, Diarrhea, Headache     Gynecologic History:  Contraception: coitus interruptus History of STI's:  Last Pap: 10/18/2018. Results were: normal.  Denies h/o abnormal pap smears. Last mammogram: Not age appropriate    Upstream - 11/23/22 4540       Pregnancy Intention Screening   Does the patient want to become pregnant in the next year? No    Does the patient's partner want to become  pregnant in the next year? N/A    Would the patient like to discuss contraceptive options today? No      Contraception Wrap Up   Current Method Withdrawal or Other Method    End Method Withdrawal or Other Method    Contraception Counseling Provided No            The pregnancy intention screening data noted above was reviewed. Potential methods of contraception were discussed. The patient elected to proceed with Withdrawal or Other Method.   OB History  Gravida Para Term Preterm AB Living  4 3 3  0 1 3  SAB IAB Ectopic Multiple Live Births  1 0 0 0 3    # Outcome Date GA Lbr Len/2nd Weight Sex Type Anes PTL Lv  4 Term 04/26/19 [redacted]w[redacted]d / 00:59 8 lb 11.7 oz (3.96 kg) M Vag-Spont EPI  LIV     Name: Sara Boyd, Sara Boyd     Apgar1: 9  Apgar5: 9  3 SAB 2018          2 Term 2014   6 lb 9 oz (2.977 kg) M Vag-Spont   LIV  1 Term 2010   5 lb 1 oz (2.296 kg) F Vag-Spont   LIV    Obstetric Comments  Pt water ruptured @ 19 weeks, bed rest, baby with CP    Past Medical History:  Diagnosis Date   Anxiety    Chicken pox    Depression    Endometritis    GERD (gastroesophageal reflux disease)  Headache    Migraine    OCD (obsessive compulsive disorder)    OCD (obsessive compulsive disorder)    Retained products of conception following abortion    UTI (urinary tract infection)     Past Surgical History:  Procedure Laterality Date   BLADDER SURGERY     DILATION AND CURETTAGE OF UTERUS     elective AB   DILATION AND EVACUATION N/A 10/29/2016   Procedure: DILATATION AND EVACUATION;  Surgeon: Nadara Mustard, MD;  Location: [facility] ORS;  Service: Gynecology;  Laterality: N/A;   HERNIA REPAIR     HERNIA REPAIR     2011   TONSILLECTOMY     1998    Family History  Problem Relation Age of Onset   Cancer Mother        NHL   Hyperlipidemia Mother    Hypertension Mother    COPD Father    Heart disease Father    Hypertension Father    Hyperlipidemia Father    Diabetes Maternal  Grandmother    Heart disease Maternal Grandfather    Cancer Paternal Grandmother        breast   Early death Paternal Grandfather    Heart disease Paternal Grandfather     Social History   Socioeconomic History   Marital status: Married    Spouse name: Not on file   Number of children: Not on file   Years of education: Not on file   Highest education level: Not on file  Occupational History   Not on file  Tobacco Use   Smoking status: Former   Smokeless tobacco: Never  Vaping Use   Vaping status: Never Used  Substance and Sexual Activity   Alcohol use: No   Drug use: No   Sexual activity: Yes    Birth control/protection: Condom  Other Topics Concern   Not on file  Social History Narrative   HS ed    Married    2 kids    Former smoker    Wears seat belt    Safe in relationship   Social Determinants of Corporate investment banker Strain: Not on file  Food Insecurity: Not on file  Transportation Needs: Not on file  Physical Activity: Not on file  Stress: Not on file  Social Connections: Not on file  Intimate Partner Violence: Not on file    Current Outpatient Medications on File Prior to Visit  Medication Sig Dispense Refill   venlafaxine XR (EFFEXOR-XR) 75 MG 24 hr capsule Take 1 capsule (75 mg total) by mouth daily. 90 capsule 3   Vitamin D, Ergocalciferol, (DRISDOL) 1.25 MG (50000 UNIT) CAPS capsule Take 1 capsule (50,000 Units total) by mouth every 7 (seven) days. 12 capsule 1   No current facility-administered medications on file prior to visit.    Allergies  Allergen Reactions   Pineapple Shortness Of Breath    Pt states her throat shuts completely. Anything pineapple, pt states it is juice, the whole fruit, or flavoring.    Sulfa Antibiotics Shortness Of Breath    Pt states her throat closes as well, tightness.    Latex Itching    Pt states her throat also starts closing.    Vicodin [Hydrocodone-Acetaminophen] Other (See Comments)     Hallucinations.    Codeine Rash    Pt states it feels like her skin is coming off.    Penicillins Rash    Pt states it feels like her skin is coming off.  Review of Systems Constitutional: negative for chills, fatigue, fevers and sweats Eyes: negative for irritation, redness and visual disturbance Ears, nose, mouth, throat, and face: negative for hearing loss, nasal congestion, snoring and tinnitus Respiratory: negative for asthma, cough, sputum Cardiovascular: negative for chest pain, dyspnea, exertional chest pressure/discomfort, irregular heart beat, palpitations and syncope Gastrointestinal: negative for abdominal pain, change in bowel habits, nausea and vomiting Genitourinary: positive for abnormal menstrual periods and vaginal odor. Negative for genital lesions, sexual problems and vaginal discharge, dysuria and urinary incontinence Integument/breast: positive for breast lump (chest nodule).  Negative for breast tenderness and nipple discharge Hematologic/lymphatic: negative for bleeding and easy bruising Musculoskeletal:negative for back pain and muscle weakness Neurological: negative for dizziness, headaches, vertigo and weakness Endocrine: negative for diabetic symptoms including polydipsia, polyuria and skin dryness Allergic/Immunologic: negative for hay fever and urticaria      Objective:  Blood pressure 112/61, pulse 87, resp. rate 16, height 5\' 4"  (1.626 m), weight (!) 321 lb (145.6 kg), last menstrual period 11/17/2022.  Body mass index is 55.1 kg/m.  General Appearance:    Alert, cooperative, no distress, appears stated age, morbid obesity  Head:    Normocephalic, without obvious abnormality, atraumatic  Eyes:    PERRL, conjunctiva/corneas clear, EOM's intact, both eyes  Ears:    Normal external ear canals, both ears  Nose:   Nares normal, septum midline, mucosa normal, no drainage or sinus tenderness  Throat:   Lips, mucosa, and tongue normal; teeth and gums  normal  Neck:   Supple, symmetrical, trachea midline, no adenopathy; thyroid: no enlargement/tenderness/nodules; no carotid bruit or JVD  Back:     Symmetric, no curvature, ROM normal, no CVA tenderness  Lungs:     Clear to auscultation bilaterally, respirations unlabored  Chest Wall:    No tenderness or deformity.  Small faintly palpable nodule, 1 x 1 cm at left chest wall (upper inner region of breast), mobile, stable compared to prior exam 2 years ago.    Heart:    Regular rate and rhythm, S1 and S2 normal, no murmur, rub or gallop  Breast Exam:    No tenderness, masses, or nipple abnormality  Abdomen:     Soft, non-tender, bowel sounds active all four quadrants, no masses, no organomegaly.    Genitalia:    Pelvic:external genitalia normal, vagina without lesions, discharge, or tenderness, rectovaginal septum  normal. Cervix normal in appearance, no cervical motion tenderness, no adnexal masses or tenderness.  Uterus normal size, shape, mobile, regular contours, nontender.  Rectal:    Normal external sphincter.  No hemorrhoids appreciated. Internal exam not done.   Extremities:   Extremities normal, atraumatic, no cyanosis or edema  Pulses:   2+ and symmetric all extremities  Skin:   Skin color, texture, turgor normal, no rashes or lesions  Lymph nodes:   Cervical, supraclavicular, and axillary nodes normal  Neurologic:   CNII-XII intact, normal strength, sensation and reflexes throughout   .  Labs:  Lab Results  Component Value Date   WBC 10.2 04/25/2019   HGB 12.1 04/25/2019   HCT 36.5 04/25/2019   MCV 87.3 04/25/2019   PLT 220 04/25/2019    Lab Results  Component Value Date   CREATININE 0.98 05/09/2018   BUN 11 05/09/2018   NA 137 05/09/2018   K 3.9 05/09/2018   CL 108 05/09/2018   CO2 22 05/09/2018    Lab Results  Component Value Date   ALT 30 01/06/2018   AST 15 01/06/2018  ALKPHOS 69 01/06/2018   BILITOT 0.4 01/06/2018    Lab Results  Component Value Date    TSH 1.270 09/08/2022    Lab Results  Component Value Date   CHOL 129 08/05/2017   HDL 34.80 (L) 08/05/2017   LDLCALC 78 08/05/2017   TRIG 80.0 08/05/2017   CHOLHDL 4 08/05/2017    Lab Results  Component Value Date   HGBA1C 5.3 09/06/2018    Assessment:   1. Encounter for well woman exam with routine gynecological exam   2. Cervical cancer screening   3. Screening for diabetes mellitus   4. Need for hepatitis C screening test   5. Irregular menses   6. Vitamin D deficiency   7. Generalized anxiety disorder   8. Class 3 severe obesity due to excess calories with body mass index (BMI) of 50.0 to 59.9 in adult, unspecified whether serious comorbidity present (HCC)   9. Vaginal odor      Plan:   1. Encounter for well woman exam with routine gynecological exam - Discussed healthy lifestyle modifications. - Mammogram: Not age appropriate - Contraception: coitus interruptus. - CBC - Comprehensive metabolic panel - Hepatitis C antibody - Hemoglobin A1c - Lipid panel  2. Cervical cancer screening - Cytology - PAP  3. Screening for diabetes mellitus - Hemoglobin A1c  4. Need for hepatitis C screening test - Discussed one time screening for low-risk patient, patient ok to perform today.  - Hepatitis C antibody  5. Irregular menses - Unclear cause, but discussed possible etiologies including hormonal imbalance, stress, obesity, etc. Desires labs to check hormones today, will order per request.  - FSH/LH - Estradiol - Progesterone  6. Vitamin D deficiency - Patient with h/o Vitamin D deficiency, currently on high dose weekly supplementation. Will recheck labs.  - Vitamin D (25 hydroxy)  7. Generalized anxiety disorder - Recently resumed on Effexor 2 months ago, feels that she is doing better mood-wise.   8. Class 3 severe obesity due to excess calories with body mass index (BMI) of 50.0 to 59.9 in adult, unspecified whether serious comorbidity present [facility]) -  Patient does have concerns regarding her weight. Thinks stress and lifestyle contribute a major part.  Can discuss methods for weight management at a future visit. Will rule out medical causes. Has had recent thyroid study which was normal.   9. Vaginal odor Unclear cause. No discharge noted in vaginal vault so less likely infectious cause. Could be hormonal related or due to hygiene (increased sweating, soap usage, etc).  Discussed utilizing Summer's Eve or Vagisil for vaginal wash due to being pH balanced formulations. ALso given samples of boric acid suppositories.     Follow up in 1 year for annual exam   Hildred Laser, MD [facility]

## 2022-11-19 NOTE — Patient Instructions (Signed)
Preventive Care 21-33 Years Old, Female Preventive care refers to lifestyle choices and visits with your health care provider that can promote health and wellness. Preventive care visits are also called wellness exams. What can I expect for my preventive care visit? Counseling During your preventive care visit, your health care provider may ask about your: Medical history, including: Past medical problems. Family medical history. Pregnancy history. Current health, including: Menstrual cycle. Method of birth control. Emotional well-being. Home life and relationship well-being. Sexual activity and sexual health. Lifestyle, including: Alcohol, nicotine or tobacco, and drug use. Access to firearms. Diet, exercise, and sleep habits. Work and work environment. Sunscreen use. Safety issues such as seatbelt and bike helmet use. Physical exam Your health care provider may check your: Height and weight. These may be used to calculate your BMI (body mass index). BMI is a measurement that tells if you are at a healthy weight. Waist circumference. This measures the distance around your waistline. This measurement also tells if you are at a healthy weight and may help predict your risk of certain diseases, such as type 2 diabetes and high blood pressure. Heart rate and blood pressure. Body temperature. Skin for abnormal spots. What immunizations do I need?  Vaccines are usually given at various ages, according to a schedule. Your health care provider will recommend vaccines for you based on your age, medical history, and lifestyle or other factors, such as travel or where you work. What tests do I need? Screening Your health care provider may recommend screening tests for certain conditions. This may include: Pelvic exam and Pap test. Lipid and cholesterol levels. Diabetes screening. This is done by checking your blood sugar (glucose) after you have not eaten for a while (fasting). Hepatitis  B test. Hepatitis C test. HIV (human immunodeficiency virus) test. STI (sexually transmitted infection) testing, if you are at risk. BRCA-related cancer screening. This may be done if you have a family history of breast, ovarian, tubal, or peritoneal cancers. Talk with your health care provider about your test results, treatment options, and if necessary, the need for more tests. Follow these instructions at home: Eating and drinking  Eat a healthy diet that includes fresh fruits and vegetables, whole grains, lean protein, and low-fat dairy products. Take vitamin and mineral supplements as recommended by your health care provider. Do not drink alcohol if: Your health care provider tells you not to drink. You are pregnant, may be pregnant, or are planning to become pregnant. If you drink alcohol: Limit how much you have to 0-1 drink a day. Know how much alcohol is in your drink. In the [place] one drink equals one 12 oz bottle of beer (355 mL), one 5 oz glass of wine (148 mL), or one 1 oz glass of hard liquor (44 mL). Lifestyle Brush your teeth every morning and night with fluoride toothpaste. Floss one time each day. Exercise for at least 30 minutes 5 or more days each week. Do not use any products that contain nicotine or tobacco. These products include cigarettes, chewing tobacco, and vaping devices, such as e-cigarettes. If you need help quitting, ask your health care provider. Do not use drugs. If you are sexually active, practice safe sex. Use a condom or other form of protection to prevent STIs. If you do not wish to become pregnant, use a form of birth control. If you plan to become pregnant, see your health care provider for a prepregnancy visit. Find healthy ways to manage stress, such as: Meditation,   yoga, or listening to music. Journaling. Talking to a trusted person. Spending time with friends and family. Minimize exposure to UV radiation to reduce your risk of skin  cancer. Safety Always wear your seat belt while driving or riding in a vehicle. Do not drive: If you have been drinking alcohol. Do not ride with someone who has been drinking. If you have been using any mind-altering substances or drugs. While texting. When you are tired or distracted. Wear a helmet and other protective equipment during sports activities. If you have firearms in your house, make sure you follow all gun safety procedures. Seek help if you have been physically or sexually abused. What's next? Go to your health care provider once a year for an annual wellness visit. Ask your health care provider how often you should have your eyes and teeth checked. Stay up to date on all vaccines. This information is not intended to replace advice given to you by your health care provider. Make sure you discuss any questions you have with your health care provider. Document Revised: 10/22/2020 Document Reviewed: 10/22/2020 Elsevier Patient Education  2024 Else[employer] Inc[employer] Breast Self-Awareness Breast self-awareness is knowing how your breasts look and feel. You need to: Check your breasts on a regular basis. Tell your doctor about any changes. Become familiar with the look and feel of your breasts. This can help you catch a breast problem while it is still small and can be treated. You should do breast self-exams even if you have breast implants. What you need: A mirror. A well-lit room. A pillow or other soft object. How to do a breast self-exam Follow these steps to do a breast self-exam: Look for changes  Take off all the clothes above your waist. Stand in front of a mirror in a room with good lighting. Put your hands down at your sides. Compare your breasts in the mirror. Look for any difference between them, such as: A difference in shape. A difference in size. Wrinkles, dips, and bumps in one breast and not the other. Look at each breast for changes in the skin, such  as: Redness. Scaly areas. Skin that has gotten thicker. Dimpling. Open sores (ulcers). Look for changes in your nipples, such as: Fluid coming out of a nipple. Fluid around a nipple. Bleeding. Dimpling. Redness. A nipple that looks pushed in (retracted), or that has changed position. Feel for changes Lie on your back. Feel each breast. To do this: Pick a breast to feel. Place a pillow under the shoulder closest to that breast. Put the arm closest to that breast behind your head. Feel the nipple area of that breast using the hand of your other arm. Feel the area with the pads of your three middle fingers by making small circles with your fingers. Use light, medium, and firm pressure. Continue the overlapping circles, moving downward over the breast. Keep making circles with your fingers. Stop when you feel your ribs. Start making circles with your fingers again, this time going upward until you reach your collarbone. Then, make circles outward across your breast and into your armpit area. Squeeze your nipple. Check for discharge and lumps. Repeat these steps to check your other breast. Sit or stand in the tub or shower. With soapy water on your skin, feel each breast the same way you did when you were lying down. Write down what you find Writing down what you find can help you remember what to tell your doctor. Write down: What is   normal for each breast. Any changes you find in each breast. These include: The kind of changes you find. A tender or painful breast. Any lump you find. Write down its size and where it is. When you last had your monthly period (menstrual cycle). General tips If you are breastfeeding, the best time to check your breasts is after you feed your baby or after you use a breast pump. If you get monthly bleeding, the best time to check your breasts is 5-7 days after your monthly cycle ends. With time, you will become comfortable with the self-exam. You will  also start to know if there are changes in your breasts. Contact a doctor if: You see a change in the shape or size of your breasts or nipples. You see a change in the skin of your breast or nipples, such as red or scaly skin. You have fluid coming from your nipples that is not normal. You find a new lump or thick area. You have breast pain. You have any concerns about your breast health. Summary Breast self-awareness includes looking for changes in your breasts and feeling for changes within your breasts. You should do breast self-awareness in front of a mirror in a well-lit room. If you get monthly periods (menstrual cycles), the best time to check your breasts is 5-7 days after your period ends. Tell your doctor about any changes you see in your breasts. Changes include changes in size, changes on the skin, painful or tender breasts, or fluid from your nipples that is not normal. This information is not intended to replace advice given to you by your health care provider. Make sure you discuss any questions you have with your health care provider. Document Revised: 10/01/2021 Document Reviewed: 02/26/2021 Elsevier Patient Education  2024 Elsevier Inc.  

## 2022-11-23 ENCOUNTER — Other Ambulatory Visit (HOSPITAL_COMMUNITY)
Admission: RE | Admit: 2022-11-23 | Discharge: 2022-11-23 | Disposition: A | Discharge status: Home / Self Care | Payer: Medicaid Other | Source: Ambulatory Visit | Attending: Obstetrics and Gynecology | Admitting: Obstetrics and Gynecology

## 2022-11-23 ENCOUNTER — Encounter: Payer: Self-pay | Admitting: Obstetrics and Gynecology

## 2022-11-23 ENCOUNTER — Ambulatory Visit: Payer: Medicaid Other | Admitting: Obstetrics and Gynecology

## 2022-11-23 VITALS — BP 112/61 | HR 87 | Resp 16 | Ht 64.0 in | Wt 321.0 lb

## 2022-11-23 DIAGNOSIS — Z1159 Encounter for screening for other viral diseases: Secondary | ICD-10-CM

## 2022-11-23 DIAGNOSIS — E559 Vitamin D deficiency, unspecified: Secondary | ICD-10-CM

## 2022-11-23 DIAGNOSIS — F411 Generalized anxiety disorder: Secondary | ICD-10-CM

## 2022-11-23 DIAGNOSIS — Z01419 Encounter for gynecological examination (general) (routine) without abnormal findings: Secondary | ICD-10-CM | POA: Diagnosis not present

## 2022-11-23 DIAGNOSIS — Z124 Encounter for screening for malignant neoplasm of cervix: Secondary | ICD-10-CM | POA: Diagnosis present

## 2022-11-23 DIAGNOSIS — Z131 Encounter for screening for diabetes mellitus: Secondary | ICD-10-CM

## 2022-11-23 DIAGNOSIS — N898 Other specified noninflammatory disorders of vagina: Secondary | ICD-10-CM

## 2022-11-23 DIAGNOSIS — N926 Irregular menstruation, unspecified: Secondary | ICD-10-CM

## 2022-11-24 ENCOUNTER — Encounter: Payer: Self-pay | Admitting: Obstetrics and Gynecology

## 2022-11-24 ENCOUNTER — Other Ambulatory Visit: Payer: Self-pay | Admitting: Obstetrics and Gynecology

## 2022-11-24 DIAGNOSIS — E119 Type 2 diabetes mellitus without complications: Secondary | ICD-10-CM

## 2022-11-24 DIAGNOSIS — E785 Hyperlipidemia, unspecified: Secondary | ICD-10-CM

## 2022-11-24 LAB — PROGESTERONE: Progesterone: 0.1 ng/mL

## 2022-11-24 LAB — CBC
Hematocrit: 42 % (ref 34.0–46.6)
Hemoglobin: 13.8 g/dL (ref 11.1–15.9)
MCH: 29.7 pg (ref 26.6–33.0)
MCHC: 32.9 g/dL (ref 31.5–35.7)
MCV: 91 fL (ref 79–97)
Platelets: 297 10*3/uL (ref 150–450)
RBC: 4.64 x10E6/uL (ref 3.77–5.28)
RDW: 12.9 % (ref 11.7–15.4)
WBC: 5.3 10*3/uL (ref 3.4–10.8)

## 2022-11-24 LAB — COMPREHENSIVE METABOLIC PANEL
ALT: 27 IU/L (ref 0–32)
AST: 18 IU/L (ref 0–40)
Albumin: 4.2 g/dL (ref 3.9–4.9)
Alkaline Phosphatase: 90 IU/L (ref 44–121)
BUN/Creatinine Ratio: 14 (ref 9–23)
BUN: 12 mg/dL (ref 6–20)
Bilirubin Total: 0.5 mg/dL (ref 0.0–1.2)
CO2: 19 mmol/L — ABNORMAL LOW (ref 20–29)
Calcium: 9.5 mg/dL (ref 8.7–10.2)
Chloride: 101 mmol/L (ref 96–106)
Creatinine, Ser: 0.86 mg/dL (ref 0.57–1.00)
Globulin, Total: 2.5 g/dL (ref 1.5–4.5)
Glucose: 224 mg/dL — ABNORMAL HIGH (ref 70–99)
Potassium: 4.3 mmol/L (ref 3.5–5.2)
Sodium: 137 mmol/L (ref 134–144)
Total Protein: 6.7 g/dL (ref 6.0–8.5)
eGFR: 92 mL/min/{1.73_m2} (ref >59)

## 2022-11-24 LAB — HEMOGLOBIN A1C
Est. average glucose Bld gHb Est-mCnc: 163 mg/dL
Hgb A1c MFr Bld: 7.3 % — ABNORMAL HIGH (ref 4.8–5.6)

## 2022-11-24 LAB — LIPID PANEL
Chol/HDL Ratio: 4.5 ratio — ABNORMAL HIGH (ref 0.0–4.4)
Cholesterol, Total: 176 mg/dL (ref 100–199)
HDL: 39 mg/dL — ABNORMAL LOW (ref >39)
LDL Chol Calc (NIH): 104 mg/dL — ABNORMAL HIGH (ref 0–99)
Triglycerides: 191 mg/dL — ABNORMAL HIGH (ref 0–149)
VLDL Cholesterol Cal: 33 mg/dL (ref 5–40)

## 2022-11-24 LAB — HEPATITIS C ANTIBODY: Hep C Virus Ab: NONREACTIVE

## 2022-11-24 LAB — VITAMIN D 25 HYDROXY (VIT D DEFICIENCY, FRACTURES): Vit D, 25-Hydroxy: 22.5 ng/mL — ABNORMAL LOW (ref 30.0–100.0)

## 2022-11-24 LAB — FSH/LH
FSH: 4.7 m[IU]/mL
LH: 11.6 m[IU]/mL

## 2022-11-24 LAB — ESTRADIOL: Estradiol: 105 pg/mL

## 2022-11-24 MED ORDER — METFORMIN HCL ER 750 MG PO TB24: 750.0000 mg | ORAL_TABLET | Freq: Every day | ORAL | 0 refills | Status: DC

## 2022-11-25 LAB — CYTOLOGY - PAP
Comment: NEGATIVE
Diagnosis: NEGATIVE
High risk HPV: NEGATIVE

## 2023-02-18 ENCOUNTER — Encounter: Payer: Self-pay | Admitting: Obstetrics and Gynecology

## 2023-03-01 ENCOUNTER — Encounter: Payer: Self-pay | Admitting: Physician Assistant

## 2023-03-01 ENCOUNTER — Telehealth: Payer: Medicaid Other | Admitting: Physician Assistant

## 2023-03-01 DIAGNOSIS — B372 Candidiasis of skin and nail: Secondary | ICD-10-CM | POA: Diagnosis not present

## 2023-03-01 DIAGNOSIS — J069 Acute upper respiratory infection, unspecified: Secondary | ICD-10-CM

## 2023-03-01 DIAGNOSIS — L0390 Cellulitis, unspecified: Secondary | ICD-10-CM

## 2023-03-01 MED ORDER — DOXYCYCLINE HYCLATE 100 MG PO TABS
100.0000 mg | ORAL_TABLET | Freq: Two times a day (BID) | ORAL | 0 refills | Status: DC
Start: 2023-03-01 — End: 2023-08-05

## 2023-03-01 MED ORDER — NYSTATIN 100000 UNIT/GM EX POWD: 1.0000 | Freq: Three times a day (TID) | CUTANEOUS | 0 refills | Status: DC

## 2023-03-01 MED ORDER — FLUCONAZOLE 150 MG PO TABS: ORAL_TABLET | ORAL | 0 refills | Status: DC

## 2023-03-01 NOTE — Progress Notes (Signed)
Virtual Visit Consent   Sara Boyd, you are scheduled for a virtual visit with a [facility] provider today. Just as with appointments in the office, your consent must be obtained to participate. Your consent will be active for this visit and any virtual visit you may have with one of our providers in the next 365 days. If you have a MyChart account, a copy of this consent can be sent to you electronically.  As this is a virtual visit, video technology does not allow for your provider to perform a traditional examination. This may limit your provider's ability to fully assess your condition. If your provider identifies any concerns that need to be evaluated in person or the need to arrange testing (such as labs, EKG, etc.), we will make arrangements to do so. Although advances in technology are sophisticated, we cannot ensure that it will always work on either your end or our end. If the connection with a video visit is poor, the visit may have to be switched to a telephone visit. With either a video or telephone visit, we are not always able to ensure that we have a secure connection.  By engaging in this virtual visit, you consent to the provision of healthcare and authorize for your insurance to be billed (if applicable) for the services provided during this visit. Depending on your insurance coverage, you may receive a charge related to this service.  I need to obtain your verbal consent now. Are you willing to proceed with your visit today? Sara Boyd has provided verbal consent on 03/01/2023 for a virtual visit (video or telephone). Piedad Climes, [place]  Date: 03/01/2023 12:58 PM  Virtual Visit via Video Note   I, Piedad Climes, connected with  Sara Boyd  ([id number], 10-28-1990) on 03/01/23 at 12:45 PM EDT by a video-enabled telemedicine application and verified that I am speaking with the correct person using two  identifiers.  Location: Patient: Virtual Visit Location Patient: Home Provider: Virtual Visit Location Provider: Home Office   I discussed the limitations of evaluation and management by telemedicine and the availability of in person appointments. The patient expressed understanding and agreed to proceed.    History of Present Illness: Sara Boyd is a 32 y.o. who identifies as a female who was assigned female at birth, and is being seen today for feeling under the weather. Notes malaise over the past couple of days associated with a boil on her R side that has drained on its own. Now with a borderline low-grade fever (Tmax 101). Patient endorses she is also with 2 days of dry cough, nasal congestion and PND without sinus pain or tooth pain. Denies recent travel. Some coworkers with URI recently. Denies any known exposure to COVID-19. Has not tested for COVID.Some mild nausea but no diarrhea or vomiting. No change in appetite. Cycle just started thought and typically has nausea with that.   Also been dealing with rash of her L axillary region for the past > 1 month. Is pruritic and irritating but not painful. Recently discovered she is a type 2 diabetic and currently on treatment with Metformin.   HPI: HPI  Problems:  Patient Active Problem List   Diagnosis Date Noted   Episode of moderate major depression (HCC) 05/09/2018   Anxiety and depression 05/04/2018   Panic disorder 05/04/2018   Obsessive-compulsive disorder 05/04/2018   Chronic UTI 08/08/2017   Gastroesophageal reflux disease 08/08/2017   Class 3 severe obesity due  to excess calories with body mass index (BMI) of 50.0 to 59.9 in adult [facility]) 08/08/2017   Palpitations 08/08/2017   Vitamin D deficiency 08/07/2017    Allergies:  Allergies  Allergen Reactions   Pineapple Shortness Of Breath    Pt states her throat shuts completely. Anything pineapple, pt states it is juice, the whole fruit, or flavoring.    Sulfa  Antibiotics Shortness Of Breath    Pt states her throat closes as well, tightness.    Latex Itching    Pt states her throat also starts closing.    Vicodin [Hydrocodone-Acetaminophen] Other (See Comments)    Hallucinations.    Codeine Rash    Pt states it feels like her skin is coming off.    Penicillins Rash    Pt states it feels like her skin is coming off.    Medications:  Current Outpatient Medications:    doxycycline (VIBRA-TABS) 100 MG tablet, Take 1 tablet (100 mg total) by mouth 2 (two) times daily., Disp: 14 tablet, Rfl: 0   fluconazole (DIFLUCAN) 150 MG tablet, Take 1 tablet PO once. Repeat in 3 days if needed., Disp: 2 tablet, Rfl: 0   nystatin (MYCOSTATIN/NYSTOP) powder, Apply 1 Application topically 3 (three) times daily., Disp: 15 g, Rfl: 0   metFORMIN (GLUCOPHAGE-XR) 750 MG 24 hr tablet, Take 1 tablet (750 mg total) by mouth daily with breakfast., Disp: 90 tablet, Rfl: 0   venlafaxine XR (EFFEXOR-XR) 75 MG 24 hr capsule, Take 1 capsule (75 mg total) by mouth daily., Disp: 90 capsule, Rfl: 3  Observations/Objective: Patient is well-developed, well-nourished in no acute distress.  Resting comfortably at home.  Head is normocephalic, atraumatic.  No labored breathing. Speech is clear and coherent with logical content.  Patient is alert and oriented at baseline.  L axillary region visualized with elliptical region of erythema with some central clearing and satellite lesions around the border about 5 x 9 cm, concerning for yeast. R torso, just inferior to breast examined with a 5 x 5 cm (estimated) area of erythema around a central area of swelling. No active drainage. Area is indurated per patient palpation.   Assessment and Plan: 1. Cellulitis of skin - doxycycline (VIBRA-TABS) 100 MG tablet; Take 1 tablet (100 mg total) by mouth 2 (two) times daily.  Dispense: 14 tablet; Refill: 0  Supportive measures reviewed. Doxycycline per orders. Strict UC/ER precautions discussed  with patient.   2. Yeast infection of the skin - nystatin (MYCOSTATIN/NYSTOP) powder; Apply 1 Application topically 3 (three) times daily.  Dispense: 15 g; Refill: 0 - fluconazole (DIFLUCAN) 150 MG tablet; Take 1 tablet PO once. Repeat in 3 days if needed.  Dispense: 2 tablet; Refill: 0  More prone to these giving body habitus and DM II. Supportive measures and skin care reviewed. Giving how long has been present, will start course of oral Diflucan and add-on Nystatin topical powder. Continue follow-up with GYN/PCP for diabetes management.   3. Viral URI  Will have her test for COVID as a precaution. She is to let us know ASAP if positive. Otherwise will have her increase fluids and rest. Start Mucinex OTC to thin congestion. Can use Mucinex-DM or add-on Delsym to plain Mucinex to also help with cough.   Follow Up Instructions: I discussed the assessment and treatment plan with the patient. The patient was provided an opportunity to ask questions and all were answered. The patient agreed with the plan and demonstrated an understanding of the instructions.  A  copy of instructions were sent to the patient via MyChart unless otherwise noted below.   The patient was advised to call back or seek an in-person evaluation if the symptoms worsen or if the condition fails to improve as anticipated.    Piedad Climes, PA-C

## 2023-03-01 NOTE — Patient Instructions (Signed)
  Sara Boyd, thank you for joining Piedad Climes, PA-C for today's virtual visit.  While this provider is not your primary care provider (PCP), if your PCP is located in our provider database this encounter information will be shared with them immediately following your visit.   A [facility] [facility] MyChart account gives you access to today's visit and all your visits, tests, and labs performed at [facility] " click here if you don't have a [facility] MyChart account or go to [email or web address]  Consent: (Patient) Sara Boyd provided verbal consent for this virtual visit at the beginning of the encounter.  Current Medications:  Current Outpatient Medications:    metFORMIN (GLUCOPHAGE-XR) 750 MG 24 hr tablet, Take 1 tablet (750 mg total) by mouth daily with breakfast., Disp: 90 tablet, Rfl: 0   venlafaxine XR (EFFEXOR-XR) 75 MG 24 hr capsule, Take 1 capsule (75 mg total) by mouth daily., Disp: 90 capsule, Rfl: 3   Vitamin D, Ergocalciferol, (DRISDOL) 1.25 MG (50000 UNIT) CAPS capsule, Take 1 capsule (50,000 Units total) by mouth every 7 (seven) days., Disp: 12 capsule, Rfl: 1   Medications ordered in this encounter:  No orders of the defined types were placed in this encounter.    *If you need refills on other medications prior to your next appointment, please contact your pharmacy*  Follow-Up: Call back or seek an in-person evaluation if the symptoms worsen or if the condition fails to improve as anticipated.  [facility] [phone]  Other Instructions Please take a COVID test as discussed. Message if this comes back positive. Regardless, increase fluids and rest. Run a humidifier in your bedroom at night. You can start a saline nasal rinse OTC for nasal congestion. Can use Mucinex-DM for congestion and cough.  Symptoms should ease up after 3-4 days. If not or worsening, let us know or be seen in person.  For  cellulitis, keep the skin clean and dry.  Take the Doxycycline as directed. If symptoms are not resolving with treatment you need an in-person evaluation.  For yeast of skin, keep the skin clean and dry. You can apply the topical Nystatin powder as directed this week, then just as needed in the future.  Take the Diflucan as directed. Again, if not resolving, please seek an in-person evaluation.    If you have been instructed to have an in-person evaluation today at a local Urgent Care facility, please use the link below. It will take you to a list of all of our available [facility], including address, phone number and hours of operation. Please do not delay care.  [facility]  If you or a family member do not have a primary care provider, use the link below to schedule a visit and establish care. When you choose a [facility] primary care physician or advanced practice provider, you gain a long-term partner in health. Find a Primary Care Provider  Learn more about [facility]'s in-office and virtual care options: [facility] - Get Care Now

## 2023-05-23 ENCOUNTER — Encounter: Payer: Self-pay | Admitting: Obstetrics and Gynecology

## 2023-05-23 DIAGNOSIS — E119 Type 2 diabetes mellitus without complications: Secondary | ICD-10-CM

## 2023-05-23 DIAGNOSIS — E785 Hyperlipidemia, unspecified: Secondary | ICD-10-CM

## 2023-07-23 ENCOUNTER — Telehealth

## 2023-07-24 ENCOUNTER — Telehealth

## 2023-07-24 ENCOUNTER — Telehealth: Admitting: Physician Assistant

## 2023-07-24 DIAGNOSIS — N760 Acute vaginitis: Secondary | ICD-10-CM | POA: Diagnosis not present

## 2023-07-24 DIAGNOSIS — B372 Candidiasis of skin and nail: Secondary | ICD-10-CM | POA: Diagnosis not present

## 2023-07-24 MED ORDER — FLUCONAZOLE 150 MG PO TABS: ORAL_TABLET | ORAL | 0 refills | Status: DC

## 2023-07-24 NOTE — Progress Notes (Signed)
 Virtual Visit Consent   Sara Boyd, you are scheduled for a virtual visit with a [facility] provider today. Just as with appointments in the office, your consent must be obtained to participate. Your consent will be active for this visit and any virtual visit you may have with one of our providers in the next 365 days. If you have a MyChart account, a copy of this consent can be sent to you electronically.  As this is a virtual visit, video technology does not allow for your provider to perform a traditional examination. This may limit your provider's ability to fully assess your condition. If your provider identifies any concerns that need to be evaluated in person or the need to arrange testing (such as labs, EKG, etc.), we will make arrangements to do so. Although advances in technology are sophisticated, we cannot ensure that it will always work on either your end or our end. If the connection with a video visit is poor, the visit may have to be switched to a telephone visit. With either a video or telephone visit, we are not always able to ensure that we have a secure connection.  By engaging in this virtual visit, you consent to the provision of healthcare and authorize for your insurance to be billed (if applicable) for the services provided during this visit. Depending on your insurance coverage, you may receive a charge related to this service.  I need to obtain your verbal consent now. Are you willing to proceed with your visit today? Sara Boyd has provided verbal consent on 07/24/2023 for a virtual visit (video or telephone). Sara Boyd, [place]  Date: 07/24/2023 6:18 PM   Virtual Visit via Video Note   I, Sara Boyd, connected with  Chondra Boyde  ([id number], 1990/09/10) on 07/24/23 at  6:15 PM EDT by a video-enabled telemedicine application and verified that I am speaking with the correct person using two identifiers.  Location: Patient:  Virtual Visit Location Patient: Home Provider: Virtual Visit Location Provider: Home Office   I discussed the limitations of evaluation and management by telemedicine and the availability of in person appointments. The patient expressed understanding and agreed to proceed.    History of Present Illness: Sara Boyd is a 33 y.o. who identifies as a female who was assigned female at birth, and is being seen today for yeast infection.  HPI: Vaginal Itching The patient's primary symptoms include genital itching. This is a new problem. The current episode started today. The problem occurs constantly. The patient is experiencing no pain. She is not pregnant. Pertinent negatives include no abdominal pain, anorexia, back pain, chills, constipation, diarrhea, discolored urine, dysuria, fever, flank pain, frequency, headaches, hematuria, joint pain, joint swelling, nausea, painful intercourse, rash, sore throat, urgency or vomiting. Nothing aggravates the symptoms. She has tried nothing for the symptoms. The treatment provided no relief.    Problems:  Patient Active Problem List   Diagnosis Date Noted   Episode of moderate major depression (HCC) 05/09/2018   Anxiety and depression 05/04/2018   Panic disorder 05/04/2018   Obsessive-compulsive disorder 05/04/2018   Chronic UTI 08/08/2017   Gastroesophageal reflux disease 08/08/2017   Class 3 severe obesity due to excess calories with body mass index (BMI) of 50.0 to 59.9 in adult [facility]) 08/08/2017   Palpitations 08/08/2017   Vitamin D deficiency 08/07/2017    Allergies:  Allergies  Allergen Reactions   Pineapple Shortness Of Breath    Pt states her throat shuts  completely. Anything pineapple, pt states it is juice, the whole fruit, or flavoring.    Sulfa Antibiotics Shortness Of Breath    Pt states her throat closes as well, tightness.    Latex Itching    Pt states her throat also starts closing.    Vicodin  [Hydrocodone-Acetaminophen] Other (See Comments)    Hallucinations.    Codeine Rash    Pt states it feels like her skin is coming off.    Penicillins Rash    Pt states it feels like her skin is coming off.    Medications:  Current Outpatient Medications:    doxycycline (VIBRA-TABS) 100 MG tablet, Take 1 tablet (100 mg total) by mouth 2 (two) times daily., Disp: 14 tablet, Rfl: 0   fluconazole (DIFLUCAN) 150 MG tablet, Take 1 tablet PO once. Repeat in 3 days if needed., Disp: 2 tablet, Rfl: 0   metFORMIN (GLUCOPHAGE-XR) 750 MG 24 hr tablet, Take 1 tablet (750 mg total) by mouth daily with breakfast., Disp: 90 tablet, Rfl: 0   nystatin (MYCOSTATIN/NYSTOP) powder, Apply 1 Application topically 3 (three) times daily., Disp: 15 g, Rfl: 0   venlafaxine XR (EFFEXOR-XR) 75 MG 24 hr capsule, Take 1 capsule (75 mg total) by mouth daily., Disp: 90 capsule, Rfl: 3  Observations/Objective: Patient is well-developed, well-nourished in no acute distress.  Resting comfortably  at home.  Head is normocephalic, atraumatic.  No labored breathing.  Speech is clear and coherent with logical content.  Patient is alert and oriented at baseline.    Assessment and Plan: 1. Vaginosis (Primary)  Patient presenting with dysuria most consistent with yeast vaginosis given history also considered PID, pregnancy, ectopic pregnancy, endometriosis, tubovarian abscess, appendicitis, UTI  and pyelonephritis, but this appears less likely considering the data gathered thus far.  I have instructed the patient to present to the ER at any time if there are any new or worsening symptoms.  The patient expressed understanding of and agreement with this plan.  Opportunity was given for questions prior to discharge and all stated questions were answered to the patient's satisfaction.   Follow Up Instructions: I discussed the assessment and treatment plan with the patient. The patient was provided an opportunity to ask questions and  all were answered. The patient agreed with the plan and demonstrated an understanding of the instructions.  A copy of instructions were sent to the patient via MyChart unless otherwise noted below.     The patient was advised to call back or seek an in-person evaluation if the symptoms worsen or if the condition fails to improve as anticipated.    Sara Kidney, PA-C

## 2023-07-24 NOTE — Patient Instructions (Signed)
  Sara Boyd, thank you for joining [name], PA-C for today's virtual visit.  While this provider is not your primary care provider (PCP), if your PCP is located in our provider database this encounter information will be shared with them immediately following your visit.   A [facility] MyChart account gives you access to today's visit and all your visits, tests, and labs performed at [facility] " click here if you don't have a [facility] MyChart account or go to [email or web address]  Consent: (Patient) Sara Boyd provided verbal consent for this virtual visit at the beginning of the encounter.  Current Medications:  Current Outpatient Medications:    doxycycline (VIBRA-TABS) 100 MG tablet, Take 1 tablet (100 mg total) by mouth 2 (two) times daily., Disp: 14 tablet, Rfl: 0   fluconazole (DIFLUCAN) 150 MG tablet, Take 1 tablet PO once. Repeat in 3 days if needed., Disp: 2 tablet, Rfl: 0   metFORMIN (GLUCOPHAGE-XR) 750 MG 24 hr tablet, Take 1 tablet (750 mg total) by mouth daily with breakfast., Disp: 90 tablet, Rfl: 0   nystatin (MYCOSTATIN/NYSTOP) powder, Apply 1 Application topically 3 (three) times daily., Disp: 15 g, Rfl: 0   venlafaxine XR (EFFEXOR-XR) 75 MG 24 hr capsule, Take 1 capsule (75 mg total) by mouth daily., Disp: 90 capsule, Rfl: 3   Medications ordered in this encounter:  Meds ordered this encounter  Medications   fluconazole (DIFLUCAN) 150 MG tablet    Sig: Take 1 tablet PO once. Repeat in 3 days if needed.    Dispense:  2 tablet    Refill:  0    Supervising Provider:   Merrilee Jansky [[id number]]     *If you need refills on other medications prior to your next appointment, please contact your pharmacy*  Follow-Up: Call back or seek an in-person evaluation if the symptoms worsen or if the condition fails to improve as anticipated.  [facility] [phone]  Other Instructions Please report to  the nearest Emergency room with any worsening symptoms. Follow up with primary care provider (PCP) in 2 -3 days.    If you have been instructed to have an in-person evaluation today at a local Urgent Care facility, please use the link below. It will take you to a list of all of our available [facility], including address, phone number and hours of operation. Please do not delay care.  [facility]  If you or a family member do not have a primary care provider, use the link below to schedule a visit and establish care. When you choose a [facility] primary care physician or advanced practice provider, you gain a long-term partner in health. Find a Primary Care Provider  Learn more about [facility]'s in-office and virtual care options:  - Get Care Now

## 2023-07-26 ENCOUNTER — Telehealth: Admitting: Physician Assistant

## 2023-07-26 DIAGNOSIS — B379 Candidiasis, unspecified: Secondary | ICD-10-CM | POA: Diagnosis not present

## 2023-07-26 MED ORDER — TERCONAZOLE 0.4 % VA CREA: 1.0000 | TOPICAL_CREAM | Freq: Every day | VAGINAL | 0 refills | Status: DC

## 2023-07-26 MED ORDER — NYSTATIN 100000 UNIT/GM EX CREA: 1.0000 | TOPICAL_CREAM | Freq: Two times a day (BID) | CUTANEOUS | 0 refills | Status: DC

## 2023-07-26 NOTE — Progress Notes (Signed)
 Virtual Visit Consent   Sara Boyd, you are scheduled for a virtual visit with a [facility] provider today. Just as with appointments in the office, your consent must be obtained to participate. Your consent will be active for this visit and any virtual visit you may have with one of our providers in the next 365 days. If you have a MyChart account, a copy of this consent can be sent to you electronically.  As this is a virtual visit, video technology does not allow for your provider to perform a traditional examination. This may limit your provider's ability to fully assess your condition. If your provider identifies any concerns that need to be evaluated in person or the need to arrange testing (such as labs, EKG, etc.), we will make arrangements to do so. Although advances in technology are sophisticated, we cannot ensure that it will always work on either your end or our end. If the connection with a video visit is poor, the visit may have to be switched to a telephone visit. With either a video or telephone visit, we are not always able to ensure that we have a secure connection.  By engaging in this virtual visit, you consent to the provision of healthcare and authorize for your insurance to be billed (if applicable) for the services provided during this visit. Depending on your insurance coverage, you may receive a charge related to this service.  I need to obtain your verbal consent now. Are you willing to proceed with your visit today? Sara Boyd has provided verbal consent on 07/26/2023 for a virtual visit (video or telephone). Sara Loveless, PA-C  Date: 07/26/2023 2:51 PM   Virtual Visit via Video Note   I, Sara Boyd, connected with  Sara Boyd  ([id number], [birth year]) on 07/26/23 at  2:45 PM EDT by a video-enabled telemedicine application and verified that I am speaking with the correct person using two  identifiers.  Location: Patient: Virtual Visit Location Patient: Home Provider: Virtual Visit Location Provider: Home Office   I discussed the limitations of evaluation and management by telemedicine and the availability of in person appointments. The patient expressed understanding and agreed to proceed.    History of Present Illness: Sara Boyd is a 33 y.o. who identifies as a female who was assigned female at birth, and is being seen today for discuss medications. She was seen [facility] on 07/24/23 for vaginal yeast infection and area under her left breast as well. She had been prescribed Fluconazole. When she went to the pharmacy they advised her to not take Fluconazole with Venlafaxine due to increased risk of prolonged QT syndrome. She is following up for alternatives.   Problems:  Patient Active Problem List   Diagnosis Date Noted   Episode of moderate major depression (HCC) 05/09/2018   Anxiety and depression 05/04/2018   Panic disorder 05/04/2018   Obsessive-compulsive disorder 05/04/2018   Chronic UTI 08/08/2017   Gastroesophageal reflux disease 08/08/2017   Class 3 severe obesity due to excess calories with body mass index (BMI) of 50.0 to 59.9 in adult [facility]) 08/08/2017   Palpitations 08/08/2017   Vitamin D deficiency 08/07/2017    Allergies:  Allergies  Allergen Reactions   Pineapple Shortness Of Breath    Pt states her throat shuts completely. Anything pineapple, pt states it is juice, the whole fruit, or flavoring.    Sulfa Antibiotics Shortness Of Breath    Pt states her throat closes as well, tightness.  Latex Itching    Pt states her throat also starts closing.    Vicodin [Hydrocodone-Acetaminophen] Other (See Comments)    Hallucinations.    Codeine Rash    Pt states it feels like her skin is coming off.    Penicillins Rash    Pt states it feels like her skin is coming off.    Medications:  Current Outpatient Medications:    nystatin cream  (MYCOSTATIN), Apply 1 Application topically 2 (two) times daily., Disp: 60 g, Rfl: 0   terconazole (TERAZOL 7) 0.4 % vaginal cream, Place 1 applicator vaginally at bedtime. For 7 days, Disp: 45 g, Rfl: 0   doxycycline (VIBRA-TABS) 100 MG tablet, Take 1 tablet (100 mg total) by mouth 2 (two) times daily., Disp: 14 tablet, Rfl: 0   fluconazole (DIFLUCAN) 150 MG tablet, Take 1 tablet PO once. Repeat in 3 days if needed., Disp: 2 tablet, Rfl: 0   metFORMIN (GLUCOPHAGE-XR) 750 MG 24 hr tablet, Take 1 tablet (750 mg total) by mouth daily with breakfast., Disp: 90 tablet, Rfl: 0   venlafaxine XR (EFFEXOR-XR) 75 MG 24 hr capsule, Take 1 capsule (75 mg total) by mouth daily., Disp: 90 capsule, Rfl: 3  Observations/Objective: Patient is well-developed, well-nourished in no acute distress.  Resting comfortably at home.  Head is normocephalic, atraumatic.  No labored breathing.  Speech is clear and coherent with logical content.  Patient is alert and oriented at baseline.    Assessment and Plan: 1. Yeast infection (Primary) - nystatin cream (MYCOSTATIN); Apply 1 Application topically 2 (two) times daily.  Dispense: 60 g; Refill: 0 - terconazole (TERAZOL 7) 0.4 % vaginal cream; Place 1 applicator vaginally at bedtime. For 7 days  Dispense: 45 g; Refill: 0  - Nystatin and Terconazole prescribed - Both reviewed to confirm no interaction with Venlafaxine and advised to patient - Follow up in person if not improving or if symptoms worsen  Follow Up Instructions: I discussed the assessment and treatment plan with the patient. The patient was provided an opportunity to ask questions and all were answered. The patient agreed with the plan and demonstrated an understanding of the instructions.  A copy of instructions were sent to the patient via MyChart unless otherwise noted below.    The patient was advised to call back or seek an in-person evaluation if the symptoms worsen or if the condition fails to  improve as anticipated.    Sara Loveless, PA-C

## 2023-07-26 NOTE — Patient Instructions (Signed)
 Sara Boyd, thank you for joining [name], PA-C for today's virtual visit.  While this provider is not your primary care provider (PCP), if your PCP is located in our provider database this encounter information will be shared with them immediately following your visit.   A [facility] MyChart account gives you access to today's visit and all your visits, tests, and labs performed at [facility] " click here if you don't have a [facility] MyChart account or go to [email or web address]  Consent: (Patient) Sara Boyd provided verbal consent for this virtual visit at the beginning of the encounter.  Current Medications:  Current Outpatient Medications:    nystatin cream (MYCOSTATIN), Apply 1 Application topically 2 (two) times daily., Disp: 60 g, Rfl: 0   terconazole (TERAZOL 7) 0.4 % vaginal cream, Place 1 applicator vaginally at bedtime. For 7 days, Disp: 45 g, Rfl: 0   doxycycline (VIBRA-TABS) 100 MG tablet, Take 1 tablet (100 mg total) by mouth 2 (two) times daily., Disp: 14 tablet, Rfl: 0   fluconazole (DIFLUCAN) 150 MG tablet, Take 1 tablet PO once. Repeat in 3 days if needed., Disp: 2 tablet, Rfl: 0   metFORMIN (GLUCOPHAGE-XR) 750 MG 24 hr tablet, Take 1 tablet (750 mg total) by mouth daily with breakfast., Disp: 90 tablet, Rfl: 0   venlafaxine XR (EFFEXOR-XR) 75 MG 24 hr capsule, Take 1 capsule (75 mg total) by mouth daily., Disp: 90 capsule, Rfl: 3   Medications ordered in this encounter:  Meds ordered this encounter  Medications   nystatin cream (MYCOSTATIN)    Sig: Apply 1 Application topically 2 (two) times daily.    Dispense:  60 g    Refill:  0    Supervising Provider:   Merrilee Jansky [[id number]]   terconazole (TERAZOL 7) 0.4 % vaginal cream    Sig: Place 1 applicator vaginally at bedtime. For 7 days    Dispense:  45 g    Refill:  0    Supervising Provider:   Merrilee Jansky [[id number]]     *If you need  refills on other medications prior to your next appointment, please contact your pharmacy*  Follow-Up: Call back or seek an in-person evaluation if the symptoms worsen or if the condition fails to improve as anticipated.  [facility] [phone]  Other Instructions  Vaginal Yeast Infection, Adult  Vaginal yeast infection is a condition that causes vaginal discharge as well as soreness, swelling, and redness (inflammation) of the vagina. This is a common condition. Some women get this infection frequently. What are the causes? This condition is caused by a change in the normal balance of the yeast (Candida) and normal bacteria that live in the vagina. This change causes an overgrowth of yeast, which causes the inflammation. What increases the risk? The condition is more likely to develop in women who: Take antibiotic medicines. Have diabetes. Take birth control pills. Are pregnant. Douche often. Have a weak body defense system (immune system). Have been taking steroid medicines for a long time. Frequently wear tight clothing. What are the signs or symptoms? Symptoms of this condition include: White, thick, creamy vaginal discharge. Swelling, itching, redness, and irritation of the vagina. The lips of the vagina (labia) may be affected as well. Pain or a burning feeling while urinating. Pain during sex. How is this diagnosed? This condition is diagnosed based on: Your medical history. A physical exam. A pelvic exam. Your health care provider will examine a  sample of your vaginal discharge under a microscope. Your health care provider may send this sample for testing to confirm the diagnosis. How is this treated? This condition is treated with medicine. Medicines may be over-the-counter or prescription. You may be told to use one or more of the following: Medicine that is taken by mouth (orally). Medicine that is applied as a cream (topically). Medicine that is  inserted directly into the vagina (suppository). Follow these instructions at home: Take or apply over-the-counter and prescription medicines only as told by your health care provider. Do not use tampons until your health care provider approves. Do not have sex until your infection has cleared. Sex can prolong or worsen your symptoms of infection. Ask your health care provider when it is safe to resume sexual activity. Keep all follow-up visits. This is important. How is this prevented?  Do not wear tight clothes, such as pantyhose or tight pants. Wear breathable cotton underwear. Do not use douches, perfumed soap, creams, or powders. Wipe from front to back after using the toilet. If you have diabetes, keep your blood sugar levels under control. Ask your health care provider for other ways to prevent yeast infections. Contact a health care provider if: You have a fever. Your symptoms go away and then return. Your symptoms do not get better with treatment. Your symptoms get worse. You have new symptoms. You develop blisters in or around your vagina. You have blood coming from your vagina and it is not your menstrual period. You develop pain in your abdomen. Summary Vaginal yeast infection is a condition that causes discharge as well as soreness, swelling, and redness (inflammation) of the vagina. This condition is treated with medicine. Medicines may be over-the-counter or prescription. Take or apply over-the-counter and prescription medicines only as told by your health care provider. Do not douche. Resume sexual activity or use of tampons as instructed by your health care provider. Contact a health care provider if your symptoms do not get better with treatment or your symptoms go away and then return. This information is not intended to replace advice given to you by your health care provider. Make sure you discuss any questions you have with your health care provider. Document  Revised: 07/14/2020 Document Reviewed: 07/14/2020 Elsevier Patient Education  2024 Elsevier Inc.   If you have been instructed to have an in-person evaluation today at a local Urgent Care facility, please use the link below. It will take you to a list of all of our available [facility], including address, phone number and hours of operation. Please do not delay care.  [facility]  If you or a family member do not have a primary care provider, use the link below to schedule a visit and establish care. When you choose a [facility] primary care physician or advanced practice provider, you gain a long-term partner in health. Find a Primary Care Provider  Learn more about [facility]'s in-office and virtual care options: [facility] - Get Care Now

## 2023-08-05 ENCOUNTER — Telehealth: Admitting: Physician Assistant

## 2023-08-05 DIAGNOSIS — B379 Candidiasis, unspecified: Secondary | ICD-10-CM | POA: Diagnosis not present

## 2023-08-05 MED ORDER — TERCONAZOLE 0.4 % VA CREA: 1.0000 | TOPICAL_CREAM | Freq: Every day | VAGINAL | 0 refills | Status: DC

## 2023-08-05 NOTE — Patient Instructions (Signed)
 Sara Boyd, thank you for joining Piedad Climes, PA-C for today's virtual visit.  While this provider is not your primary care provider (PCP), if your PCP is located in our provider database this encounter information will be shared with them immediately following your visit.   A [facility] MyChart account gives you access to today's visit and all your visits, tests, and labs performed at [facility] " click here if you don't have a [facility] MyChart account or go to [email or web address]  Consent: (Patient) Sara Boyd provided verbal consent for this virtual visit at the beginning of the encounter.  Current Medications:  Current Outpatient Medications:    doxycycline (VIBRA-TABS) 100 MG tablet, Take 1 tablet (100 mg total) by mouth 2 (two) times daily., Disp: 14 tablet, Rfl: 0   fluconazole (DIFLUCAN) 150 MG tablet, Take 1 tablet PO once. Repeat in 3 days if needed., Disp: 2 tablet, Rfl: 0   metFORMIN (GLUCOPHAGE-XR) 750 MG 24 hr tablet, Take 1 tablet (750 mg total) by mouth daily with breakfast., Disp: 90 tablet, Rfl: 0   nystatin cream (MYCOSTATIN), Apply 1 Application topically 2 (two) times daily., Disp: 60 g, Rfl: 0   terconazole (TERAZOL 7) 0.4 % vaginal cream, Place 1 applicator vaginally at bedtime. For 7 days, Disp: 45 g, Rfl: 0   venlafaxine XR (EFFEXOR-XR) 75 MG 24 hr capsule, Take 1 capsule (75 mg total) by mouth daily., Disp: 90 capsule, Rfl: 3   Medications ordered in this encounter:  No orders of the defined types were placed in this encounter.    *If you need refills on other medications prior to your next appointment, please contact your pharmacy*  Follow-Up: Call back or seek an in-person evaluation if the symptoms worsen or if the condition fails to improve as anticipated.  [facility] [phone]  Other Instructions Vaginal Yeast Infection, Adult  Vaginal yeast infection is a condition that  causes vaginal discharge as well as soreness, swelling, and redness (inflammation) of the vagina. This is a common condition. Some women get this infection frequently. What are the causes? This condition is caused by a change in the normal balance of the yeast (Candida) and normal bacteria that live in the vagina. This change causes an overgrowth of yeast, which causes the inflammation. What increases the risk? The condition is more likely to develop in women who: Take antibiotic medicines. Have diabetes. Take birth control pills. Are pregnant. Douche often. Have a weak body defense system (immune system). Have been taking steroid medicines for a long time. Frequently wear tight clothing. What are the signs or symptoms? Symptoms of this condition include: White, thick, creamy vaginal discharge. Swelling, itching, redness, and irritation of the vagina. The lips of the vagina (labia) may be affected as well. Pain or a burning feeling while urinating. Pain during sex. How is this diagnosed? This condition is diagnosed based on: Your medical history. A physical exam. A pelvic exam. Your health care provider will examine a sample of your vaginal discharge under a microscope. Your health care provider may send this sample for testing to confirm the diagnosis. How is this treated? This condition is treated with medicine. Medicines may be over-the-counter or prescription. You may be told to use one or more of the following: Medicine that is taken by mouth (orally). Medicine that is applied as a cream (topically). Medicine that is inserted directly into the vagina (suppository). Follow these instructions at home: Take or apply over-the-counter and  prescription medicines only as told by your health care provider. Do not use tampons until your health care provider approves. Do not have sex until your infection has cleared. Sex can prolong or worsen your symptoms of infection. Ask your health  care provider when it is safe to resume sexual activity. Keep all follow-up visits. This is important. How is this prevented?  Do not wear tight clothes, such as pantyhose or tight pants. Wear breathable cotton underwear. Do not use douches, perfumed soap, creams, or powders. Wipe from front to back after using the toilet. If you have diabetes, keep your blood sugar levels under control. Ask your health care provider for other ways to prevent yeast infections. Contact a health care provider if: You have a fever. Your symptoms go away and then return. Your symptoms do not get better with treatment. Your symptoms get worse. You have new symptoms. You develop blisters in or around your vagina. You have blood coming from your vagina and it is not your menstrual period. You develop pain in your abdomen. Summary Vaginal yeast infection is a condition that causes discharge as well as soreness, swelling, and redness (inflammation) of the vagina. This condition is treated with medicine. Medicines may be over-the-counter or prescription. Take or apply over-the-counter and prescription medicines only as told by your health care provider. Do not douche. Resume sexual activity or use of tampons as instructed by your health care provider. Contact a health care provider if your symptoms do not get better with treatment or your symptoms go away and then return. This information is not intended to replace advice given to you by your health care provider. Make sure you discuss any questions you have with your health care provider. Document Revised: 07/14/2020 Document Reviewed: 07/14/2020 Elsevier Patient Education  2024 Elsevier Inc.   If you have been instructed to have an in-person evaluation today at a local Urgent Care facility, please use the link below. It will take you to a list of all of our available [facility], including address, phone number and hours of operation. Please do  not delay care.  [facility]  If you or a family member do not have a primary care provider, use the link below to schedule a visit and establish care. When you choose a [facility] primary care physician or advanced practice provider, you gain a long-term partner in health. Find a Primary Care Provider  Learn more about [facility]'s in-office and virtual care options: [facility] - Get Care Now

## 2023-08-05 NOTE — Progress Notes (Signed)
 Virtual Visit Consent   Sara Boyd, you are scheduled for a virtual visit with a [facility] provider today. Just as with appointments in the office, your consent must be obtained to participate. Your consent will be active for this visit and any virtual visit you may have with one of our providers in the next 365 days. If you have a MyChart account, a copy of this consent can be sent to you electronically.  As this is a virtual visit, video technology does not allow for your provider to perform a traditional examination. This may limit your provider's ability to fully assess your condition. If your provider identifies any concerns that need to be evaluated in person or the need to arrange testing (such as labs, EKG, etc.), we will make arrangements to do so. Although advances in technology are sophisticated, we cannot ensure that it will always work on either your end or our end. If the connection with a video visit is poor, the visit may have to be switched to a telephone visit. With either a video or telephone visit, we are not always able to ensure that we have a secure connection.  By engaging in this virtual visit, you consent to the provision of healthcare and authorize for your insurance to be billed (if applicable) for the services provided during this visit. Depending on your insurance coverage, you may receive a charge related to this service.  I need to obtain your verbal consent now. Are you willing to proceed with your visit today? Sara Boyd has provided verbal consent on 08/05/2023 for a virtual visit (video or telephone). Piedad Climes, [place]  Date: 08/05/2023 2:54 PM   Virtual Visit via Video Note   I, Piedad Climes, connected with  Sara Boyd  ([id number], 27-Mar-1991) on 08/05/23 at  2:45 PM EDT by a video-enabled telemedicine application and verified that I am speaking with the correct person using two  identifiers.  Location: Patient: Virtual Visit Location Patient: Home Provider: Virtual Visit Location Provider: Home Office   I discussed the limitations of evaluation and management by telemedicine and the availability of in person appointments. The patient expressed understanding and agreed to proceed.    History of Present Illness: Sara Boyd is a 33 y.o. who identifies as a female who was assigned female at birth, and is being seen today for some residual vaginal itching with thick, cottage cheese-like discharge. Notes was seen last week and given script for vaginal terconazole. Notes using as directed with substantial improvement but symptoms not fully resolving. She is seeing Endo currently for uncontrolled diabetes and is wondering if this is related. Marland Kitchen   HPI: HPI  Problems:  Patient Active Problem List   Diagnosis Date Noted   Episode of moderate major depression (HCC) 05/09/2018   Anxiety and depression 05/04/2018   Panic disorder 05/04/2018   Obsessive-compulsive disorder 05/04/2018   Chronic UTI 08/08/2017   Gastroesophageal reflux disease 08/08/2017   Class 3 severe obesity due to excess calories with body mass index (BMI) of 50.0 to 59.9 in adult [facility]) 08/08/2017   Palpitations 08/08/2017   Vitamin D deficiency 08/07/2017    Allergies:  Allergies  Allergen Reactions   Pineapple Shortness Of Breath    Pt states her throat shuts completely. Anything pineapple, pt states it is juice, the whole fruit, or flavoring.    Sulfa Antibiotics Shortness Of Breath    Pt states her throat closes as well, tightness.  Latex Itching    Pt states her throat also starts closing.    Vicodin [Hydrocodone-Acetaminophen] Other (See Comments)    Hallucinations.    Codeine Rash    Pt states it feels like her skin is coming off.    Penicillins Rash    Pt states it feels like her skin is coming off.    Medications:  Current Outpatient Medications:    metFORMIN  (GLUCOPHAGE-XR) 750 MG 24 hr tablet, Take 1 tablet (750 mg total) by mouth daily with breakfast., Disp: 90 tablet, Rfl: 0   nystatin cream (MYCOSTATIN), Apply 1 Application topically 2 (two) times daily., Disp: 60 g, Rfl: 0   terconazole (TERAZOL 7) 0.4 % vaginal cream, Place 1 applicator vaginally at bedtime. For 7 days, Disp: 45 g, Rfl: 0   venlafaxine XR (EFFEXOR-XR) 75 MG 24 hr capsule, Take 1 capsule (75 mg total) by mouth daily., Disp: 90 capsule, Rfl: 3  Observations/Objective: Patient is well-developed, well-nourished in no acute distress.  Resting comfortably  at home.  Head is normocephalic, atraumatic.  No labored breathing.  Speech is clear and coherent with logical content.  Patient is alert and oriented at baseline.   Assessment and Plan: 1. Yeast infection - terconazole (TERAZOL 7) 0.4 % vaginal cream; Place 1 applicator vaginally at bedtime. For 7 days  Dispense: 45 g; Refill: 0  Will extend course of Terazol as she cannot take Diflucan at present due to other QT prolonging agents. Needs OB follow-up which she agrees to call and schedule.  Follow Up Instructions: I discussed the assessment and treatment plan with the patient. The patient was provided an opportunity to ask questions and all were answered. The patient agreed with the plan and demonstrated an understanding of the instructions.  A copy of instructions were sent to the patient via MyChart unless otherwise noted below.   The patient was advised to call back or seek an in-person evaluation if the symptoms worsen or if the condition fails to improve as anticipated.    Piedad Climes, PA-C

## 2023-09-13 ENCOUNTER — Telehealth: Payer: Self-pay

## 2023-09-13 MED ORDER — VENLAFAXINE HCL ER 75 MG PO CP24: 75.0000 mg | ORAL_CAPSULE | Freq: Every day | ORAL | 0 refills | Status: DC

## 2023-09-13 NOTE — Telephone Encounter (Signed)
-----   Message from Nurse Tempestt Silba S sent at 09/13/2023 10:30 AM EDT -----  ----- Message ----- From: Arma Berkshire Sent: 09/13/2023   8:11 AM EDT To: Desmond Florida, LPN

## 2023-09-19 ENCOUNTER — Other Ambulatory Visit: Payer: Self-pay

## 2023-09-19 DIAGNOSIS — F32A Depression, unspecified: Secondary | ICD-10-CM

## 2023-09-19 DIAGNOSIS — F419 Anxiety disorder, unspecified: Secondary | ICD-10-CM

## 2023-09-19 MED ORDER — VENLAFAXINE HCL ER 75 MG PO CP24
75.0000 mg | ORAL_CAPSULE | Freq: Every day | ORAL | 0 refills | Status: DC
Start: 2023-09-19 — End: 2024-03-12

## 2024-01-27 ENCOUNTER — Ambulatory Visit (INDEPENDENT_AMBULATORY_CARE_PROVIDER_SITE_OTHER)

## 2024-01-27 VITALS — BP 121/88 | HR 102 | Ht 64.0 in | Wt 293.0 lb

## 2024-01-27 DIAGNOSIS — Z3687 Encounter for antenatal screening for uncertain dates: Secondary | ICD-10-CM

## 2024-01-27 DIAGNOSIS — N912 Amenorrhea, unspecified: Secondary | ICD-10-CM

## 2024-01-27 DIAGNOSIS — Z3201 Encounter for pregnancy test, result positive: Secondary | ICD-10-CM | POA: Diagnosis not present

## 2024-01-27 DIAGNOSIS — Z3200 Encounter for pregnancy test, result unknown: Secondary | ICD-10-CM

## 2024-01-27 LAB — POCT URINE PREGNANCY: Preg Test, Ur: POSITIVE — AB

## 2024-01-27 NOTE — Progress Notes (Signed)
    NURSE VISIT NOTE  Subjective:    Patient ID: Sara Boyd, female    DOB: February 12, 1991, 33 y.o.   MRN: [id number]  HPI  Patient is a 33 y.o. [id number] female who presents for evaluation of amenorrhea. She believes she could be pregnant. Pregnancy is desired. Current symptoms also include: breast tenderness, frequent urination, morning sickness, nausea, and positive home pregnancy test. Last period was normal.    Objective:    BP 121/88   Pulse (!) 102   Ht 5' 4 (1.626 m)   Wt 293 lb (132.9 kg)   LMP 12/17/2023 (Exact Date)   BMI 50.29 kg/m   Lab Review  Results for orders placed or performed in visit on 01/27/24  POCT urine pregnancy  Result Value Ref Range   Preg Test, Ur Positive (A) Negative    Assessment:   1. Possible pregnancy, not confirmed   2. Encounter for antenatal screening for uncertain dates     Plan:   Pregnancy Test: Positive  Estimated Date of Delivery: None noted. Encouraged well-balanced diet, plenty of rest when needed, pre-natal vitamins daily and walking for exercise.  Discussed self-help for nausea, avoiding OTC medications until consulting provider or pharmacist, other than Tylenol  as needed, minimal caffeine (1-2 cups daily) and avoiding alcohol.   She will schedule her nurse visit @ 7-[redacted] wks pregnant, u/s for dating @10  wk, and NOB visit at [redacted] wk pregnant.    Feel free to call with any questions.   Mikaela Hilgeman H Shalon Councilman, CMA

## 2024-02-06 ENCOUNTER — Telehealth (INDEPENDENT_AMBULATORY_CARE_PROVIDER_SITE_OTHER)

## 2024-02-06 DIAGNOSIS — E66813 Obesity, class 3: Secondary | ICD-10-CM

## 2024-02-06 DIAGNOSIS — O0990 Supervision of high risk pregnancy, unspecified, unspecified trimester: Secondary | ICD-10-CM | POA: Insufficient documentation

## 2024-02-06 DIAGNOSIS — O24111 Pre-existing diabetes mellitus, type 2, in pregnancy, first trimester: Secondary | ICD-10-CM | POA: Insufficient documentation

## 2024-02-06 DIAGNOSIS — Z3689 Encounter for other specified antenatal screening: Secondary | ICD-10-CM

## 2024-02-06 NOTE — Progress Notes (Signed)
 New OB Intake  I connected with  Almarie Etha Server on 02/06/24 at  2:15 PM EDT by MyChart Video Visit and verified that I am speaking with the correct person using two identifiers. Nurse is located at [facility] and pt is located at home.  I discussed the limitations, risks, security and privacy concerns of performing an evaluation and management service by telephone and the availability of in person appointments. I also discussed with the patient that there may be a patient responsible charge related to this service. The patient expressed understanding and agreed to proceed.  I explained I am completing New OB Intake today. We discussed her EDD of 09/22/24 that is based on LMP of 12/16/24. Pt is G5/P3013. I reviewed her allergies, medications, Medical/Surgical/OB history, and appropriate screenings. There are cats in the home in the home  yes If yes Indoor Based on history, this is a/an pregnancy complicated by Obesity and T2DM .   Patient Active Problem List   Diagnosis Date Noted   Supervision of high risk pregnancy, antepartum 02/06/2024   Type 2 diabetes mellitus affecting pregnancy in first trimester, antepartum    Episode of moderate major depression (HCC) 05/09/2018   Anxiety and depression 05/04/2018   Panic disorder 05/04/2018   Obsessive-compulsive disorder 05/04/2018   Chronic UTI 08/08/2017   Gastroesophageal reflux disease 08/08/2017   Obesity, unspecified 08/08/2017   Palpitations 08/08/2017   Vitamin D  deficiency 08/07/2017    Concerns addressed today: Patient states she has had heart palpitations. She has made a cardiology appointment. She advised she had severe bradycardia after delivery in 2020. Her endocrinologist recently d/c her metformin  due to muscle weakness. She has been prescribed Lunesta and a Dexcom G7 which she is awaiting to pick up from pharmacy.  Delivery Plans:  Plans to deliver at [facility].  Anatomy US  Explained first  scheduled US  is scheduled for 03/02/24. Anatomy US  will be around 19 weeks.  Labs Discussed genetic screening with patient. Patient desires genetic testing to be drawn at new OB visit. Discussed possible labs to be drawn at new OB appointment.  COVID Vaccine Patient has not had COVID vaccine.   Social Determinants of Health Food Insecurity: denies food insecurity WIC Referral: Patient is not interested in referral to [facility].  Transportation: Patient expressed transportation needs. Childcare: Discussed no children allowed at ultrasound appointments.   First visit review I reviewed new OB appt with pt. I explained she will have bloodwork and pap smear/pelvic exam if indicated. Explained pt will be seen by Damien Parsley, CNM at first visit; encounter routed to appropriate provider.   [employer], LPN [id number]  6:99 PM

## 2024-02-06 NOTE — Patient Instructions (Addendum)
 Pregnancy and Toxoplasmosis Toxoplasmosis is an infection that is caused by a parasite. In most cases, there are no symptoms. The body's disease-fighting system (immune system) can usually fight off the infection. If you get this condition during pregnancy, the infection may spread to your baby. What are the causes of toxoplasmosis? This condition may be caused by: Touching anything that has the parasite on or in it (is contaminated) and then touching your mouth. This includes infected cat stool (feces). The parasite is often spread through cat feces, especially from cats that go outdoors. Eating or drinking contaminated foods, such as: Undercooked or raw meat. Unwashed fruit and vegetables. Contaminated water. Unpasteurized milk. Touching contaminated soil or sand, such as in a garden or sandbox, without gloves on your hands and then touching your mouth. Your baby can get this condition through the placenta if you are infected during pregnancy or just before pregnancy. How does toxoplasmosis affect me? If you are healthy and get this condition, you may not have any symptoms. This condition does not cause any long-term problems for healthy people. If you are infected with human immunodeficiency virus (HIV) or have a weak immune system, you may have a more serious illness. If you get sick, symptoms may include: A fever. Swollen glands. Muscle aches. Headaches. Feeling like you have a cold or the flu. If you are infected during pregnancy, you are at increased risk for having a miscarriage or stillbirth. How does toxoplasmosis affect my baby? Most babies born with this infection do not show signs or symptoms at birth. Problems can develop later in life. These may include problems with vision or hearing, problems with learning, and other problems with thinking and memory (cognitive disabilities) as they grow. How is this diagnosed? This condition is diagnosed with a blood test. When you become  pregnant, your health care provider may order a blood test to check whether you have ever had this condition. If you had it at least 6 months before becoming pregnant, your baby will be protected by your immunity. If you have never had it, your health care provider may repeat this test at a later date. If you become infected during pregnancy, your health care provider may do more tests to find out if the infection has spread to your baby. Other tests may include an ultrasound and a test of your amniotic fluid (amniocentesis). How is this treated? This condition may be treated with antibiotics and other medicines. Medicine given to you during pregnancy can lower your baby's chance of having problems later on. After your baby is born, they may need to take medicines for up to a year. What can I do to lower my risk?  If you have a sandbox, cover it when it is not being used. If you have a cat: Have someone else change the cat's litter box daily. They should wash their hands for at least 20 seconds afterward. Do not let your cat outside. Do not feed your cat any raw meat. Avoid working in soil where cats may leave feces. Wear gloves when you work in the soil. Wash your hands with soap and water for at least 20 seconds when you are done. Follow these instructions at home: Eating and drinking Wash and peel all fruits and vegetables before eating them. Do not drink untreated water or unpasteurized milk. Do not eat undercooked meat, especially meat that has never been frozen. Cook meat to an internal temperature that will kill the parasite. Use a food thermometer  to safely determine the temperature of the cooked meat. Contact a health care provider if: You think you have this condition or may have been exposed to it. You do not feel your baby moving as much as usual. You have vaginal bleeding. This information is not intended to replace advice given to you by your health care provider. Make sure you  discuss any questions you have with your health care provider. Document Revised: 09/28/2021 Document Reviewed: 09/16/2021 Elsevier Patient Education  2024 [employer]. How a Baby Grows During Pregnancy Pregnancy starts when a fertilized egg attaches to the lining of the uterus and begins to grow. It is a time of many changes in the mother's body. The changes happen: To support your pregnancy. To help the baby grow. To prepare for the birth of your baby. How long does a pregnancy last? A pregnancy usually lasts 280 days, or about 40 weeks. Pregnancy is divided into three periods of growth, also called trimesters: First trimester: weeks 0-13. Second trimester: weeks 14-27. Third trimester: weeks 28-40. The end of the 40 weeks of pregnancy is your likely date of delivery, or due date. However, most babies are not born on their due date. How does my baby develop month by month?  First and second month The brain, spinal cord, and heart begin to develop. By 6 weeks, the heart begins to beat. The face, arms, and legs begin to form. Then the hands and feet begin to develop. All major organs begin to develop by the end of the second month. Third month All of the internal organs are forming. Bones and muscles are beginning to grow. The fetus is making movements similar to breathing. Fingernails and toenails are forming. Fourth month The skin is thin and transparent. The neck, outer ear, eyelids, and fingernails are formed. The external sex organs are formed. The fetus can hear, swallow, and move easily. The kidneys begin to make pee (urine). Fifth month The fetus moves around more and can be felt for the first time (quickening). The face, nose, and lips can be seen easily on ultrasound. The baby is covered with soft hair called lanugo. The organs in the digestive system work. Sixth month The lungs continue to grow and mature. The eyes open. The brain continues to develop. The fetus  may begin to suck a finger. Skin ridges are formed that will be fingerprints and toe prints. Hair grows thicker and eyebrows can be seen. Seventh month Lungs are fully developed but not yet ready for birth. Eyes are developed enough to sense changes in light. The fetus responds to sound. The fetus kicks and stretches. Hands can make a grasping motion. Vernix, a waxy coating, is starting to develop to protect the skin. Eighth month Most organs and body systems are fully developed and working. Bones harden, and taste buds develop. The fetus may hiccup. The brain is still developing. The skull remains soft. By week 31, most development is complete and the fetus is gaining weight fast. By the end of week 32, the fetus weighs a little more than 4 pounds (1.8 kg). Ninth month until your due date The lungs are fully developed and ready for birth. Patterns of sleep develop. The fetus weighs around 6 pounds at the beginning of the ninth month and may weigh around 8 pounds by your due date. The fetus's head typically moves into a head-down position in the uterus. Closer to your due date, the fetus's head may drop lower in your hips. How  do I know if my baby is developing well? Always talk with your health care provider about any concerns that you may have about your pregnancy and your baby. At prenatal visits, your provider will do tests to check on your health and keep track of your baby's growth. These include: Fundal height and position. To do this, your provider will: Measure your growing belly from your pubic bone to the top of the uterus using a tape measure. Feel your belly to determine your baby's position. Heartbeat. An ultrasound in the first trimester can confirm pregnancy and show a heartbeat, depending on how far along you are. Your provider will check your baby's heart rate at prenatal visits. You may also have a second trimester ultrasound to check your baby's development. Follow  these instructions at home: Take your medicines as told. Take prenatal vitamins as told by your provider. These include vitamins such as folic acid, iron, calcium, and vitamin D . They are important for healthy development of your baby. Keep all follow-up visits. These include prenatal care and screening tests to check your health and your baby's health. This information is not intended to replace advice given to you by your health care provider. Make sure you discuss any questions you have with your health care provider. Document Revised: 09/27/2022 Document Reviewed: 09/27/2022 Elsevier Patient Education  2024 [employer] Heartburn During Pregnancy  Heartburn is a type of pain or discomfort in the throat or chest. It may cause a burning feeling. It happens when stomach acid backs up into the part of the body that moves food from your mouth to your stomach (esophagus). This condition is also called acid reflux. Heartburn is common during pregnancy. It usually goes away or gets better after giving birth. What are the causes? Changing amounts of hormones in the body. Large meals. Certain foods and drinks. More acid being made in the stomach. What increases the risk? You are more likely to develop this condition if: You had heartburn before you became pregnant. You have been pregnant more than once before. You are overweight or obese. Heartburn is also likely to happen as you get further along in your pregnancy. The risk is higher in the last 3 months before birth (third trimester). What are the signs or symptoms? Burning pain in the chest or lower throat. A bitter taste in the mouth. Coughing. Problems swallowing. Vomiting. A hoarse voice. Asthma. How is this treated? Treatment for this condition depends on how bad your symptoms are. Your doctor may ask you to: Take over-the-counter medicines for mild heartburn. These medicines include antacids or acid reducers. Take prescription  medicines to reduce stomach acid or to protect your stomach. Change your diet. Raise the head of your bed so it is higher than the foot of the bed. Follow these instructions at home: Eating and drinking Do not drink alcohol while you are pregnant. Learn which foods and drinks make you feel worse, and avoid them. Avoid drinking a lot of liquid with your meals. Avoid eating meals during the 2-3 hours before you go to bed. Avoid lying down right after you eat. Do not exercise right after you eat. Drinks to avoid Coffee and tea, with or without caffeine. Energy drinks and sports drinks. Carbonated drinks or sodas. Citrus fruit juices. Foods to avoid Spicy foods and foods that have a lot of acid in them. These include: Peppers, chili powder, curry powder, vinegar, hot sauces, and barbecue sauce. Citrus fruits, such as oranges, lemons, and limes.  Tomato-based foods, such as red sauce, chili, and salsa. High-fat foods, such as: Hot dogs, precooked or cured meats, sausage, ham, and bacon. Whole milk, butter, and cheese. Fried and fatty foods, such as donuts, french fries, potato chips, and high-fat dressings. Chocolate and cocoa. Mint. Medicines Take over-the-counter and prescription medicines only as told by your doctor. Do not take aspirin or ibuprofen  unless you are told to. Your doctor may tell you to avoid medicines that have sodium bicarbonate in them. General instructions If told, raise the head of your bed about 6 inches (15 cm). You can do this by putting blocks under the legs. Sleeping with more pillows does not help with heartburn. Do not smoke or use any products that contain nicotine  or tobacco. If you need help quitting, ask your doctor. Wear loose-fitting clothing. Try to lower your stress. You may do yoga or meditation. If you need help, ask your doctor. Stay at a healthy weight. If you are overweight, work with your doctor to safely manage your weight. Where to find more  information American Pregnancy Association: americanpregnancy.org Contact a doctor if: You have heartburn often for more than 2 weeks. You get new symptoms. Your symptoms do not get better with treatment. You lose weight and you do not know why. You have trouble swallowing. You make loud sounds when you breathe (wheeze). You have a cough that does not go away. You feel like you may vomit (nausea), or you vomit, and this does not get better with treatment. You have pain in your belly (abdomen). Your poop (stool) is bloody or black. You have pain when swallowing. Get help right away if: You have very bad chest pain that spreads to your arm, neck, or jaw. You feel sweaty, dizzy, or light-headed. You have trouble breathing. You vomit, and your vomit looks like blood or coffee grounds. These symptoms may be an emergency. Get help right away. Call 911. Do not wait to see if the symptoms will go away. Do not drive yourself to the hospital. This information is not intended to replace advice given to you by your health care provider. Make sure you discuss any questions you have with your health care provider. Document Revised: 10/06/2021 Document Reviewed: 10/06/2021 Elsevier Patient Education  2024 [employer] Inc[employer] Questions to Ask Your Health Care Provider During Pregnancy  During pregnancy, you'll go through many changes. These will affect your body as well as your feelings and emotions. And you'll likely have a lot of questions. Your health care team is a good source for reliable answers. Make an appointment with your team if you're planning to get pregnant or as soon as you know that you're pregnant. New questions will come up as your pregnancy continues. Write your questions down and take them with you to your prenatal visits. Questions to ask about pregnancy Prenatal visits and tests How often will I have my prenatal visits? Should I visit the dentist during pregnancy? What type of  screening tests should I consider? What type of routine tests are suggested and when are they done? What are the risks and benefits of these tests? When would you recommend an ultrasound, and what will it show? Is there a nurse line or office line I can call if I have questions? Caring for yourself during pregnancy How much weight should I gain? What kind of exercise should I get and how much? How much sleep should I get? What kinds of things can I do to help with stress and  anxiety? Are there any travel restrictions? Is it OK to have sex? Eating and drinking during pregnancy What vitamins or supplements do you recommend? When should I start taking my prenatal vitamins? What's a healthy diet for me during pregnancy? What foods should I eat? What foods should I not eat? What if I'm on a special diet? Should I limit how much caffeine I have? Vaccinations What shots should I get? When is the best time to get them? Are there shots I should not get? Medicine and substance use during pregnancy Are my current medicines OK to keep taking? Which medicines could hurt me or my baby? Which supplements or herbal medicines could hurt me or my baby? Why is it important not to smoke or drink alcohol? What about other drugs or substances? Where can I get help if I'm having a hard time quitting? Questions to ask about labor and birth Getting ready What are my birth options? Should I make a birth plan? Where can I have my baby? Is a birth center or home birth an option for me? What are the benefits of breastfeeding? When should I start preparing for breastfeeding? Classes Are there breastfeeding classes or support groups? Where can I find childbirth classes? Pain relief What is natural childbirth? What can I do to decrease pain during labor and birth? What are other methods to help with pain during labor and birth? Birth What is induced labor? What's an episiotomy, and when might I need  one? How can I increase my chance for a vaginal birth? When would a C-section be advised? Can I have a vaginal birth if I had a C-section in the past? Other questions to ask How long will I need to stay in the hospital after giving birth? How soon can I get pregnant after giving birth? What are my birth control options? What are the signs of perinatal depression? What should I do if I have depression or anxiety that's getting worse? This information is not intended to replace advice given to you by your health care provider. Make sure you discuss any questions you have with your health care provider. Document Revised: 01/18/2023 Document Reviewed: 01/18/2023 Elsevier Patient Education  2024 [employer] Severe Morning Sickness: What to Know Severe morning sickness is serious and can happen during pregnancy. You may hear it called hyperemesis gravidarum or HG. In severe morning sickness, you may vomit all day or for many days. You may also feel like vomiting for many days. Severe morning sickness can keep you from eating and drinking enough. This may make you lose too much fluid in your body, or become dehydrated. It can also lead to poor nutrition and weight loss. Severe morning sickness usually happens within the first 20 weeks of pregnancy. After that, it may go away for the rest of your pregnancy, but sometimes it doesn't. What are the causes? The cause of severe morning sickness is not known. It may be linked to: Changes in chemicals called hormones. Changes in the digestive system, such as the esophagus, the stomach, and the bowels. Conditions that are passed in families. What are the signs or symptoms? Very bad vomiting or feeling like you may vomit. These do not get better or go away. Loss of body fluids. No desire for food. Weight loss. How is this diagnosed? Severe morning sickness may be diagnosed based on your medical history, your symptoms, and an exam. Tests that may be done  include: Blood tests. Urine tests. Checking blood pressure.  How is this treated? You may be told to: Follow an eating plan to lessen vomiting. Eat or drink foods or fluids that have ginger, ginger ale, or ginger tea in them. Use acupressure bracelets or hypnosis. Take medicines. An eating plan and medicines may be used together. If medicines don't help, you may need to get fluids through an IV. This is a small catheter that's put into a vein in your hand or arm. Follow these instructions at home: To help ease your symptoms, listen to your body. Everyone is different. Find what works best for you. Here are some things you can try to help relieve your symptoms: Meals and snacks  Eat 5-6 small meals or snacks instead of 3 large meals a day. Eat slowly. Before getting out of bed, eat a few crackers. Eat a snack with protein before bed. This could be cheese and crackers, or a peanut butter sandwich made with 1 slice of whole-wheat bread and 1 tsp (5 g) of peanut butter. Try to eat starchy foods as these are usually tolerated well. Examples include toast, bread, pasta, rice, and pretzels. Eat at least one serving of protein with your meals and snacks. Proteins include lean meats, poultry, seafood, beans, nuts, nut butters, eggs, cheese, and yogurt. Fluids It's important to have enough fluid in your body. Try to: Drink small amounts of fluids often. Drink fluids 30 minutes before or after a meal. This will help lessen the feeling of a full stomach. Drink 100% fruit juice or an electrolyte drink. An electrolyte drink contains sodium, potassium, and chloride. Drink fluids that are cold, clear, and carbonated or sour. These include lemonade, ginger ale, lemon-lime soda, ice water, and sparkling water. Add  tsp (0.44 g) ground ginger to hot tea, or drink ginger tea. Other tips that can help Try to avoid: Eating foods that trigger your symptoms. These may include spicy foods, coffee, high-fat  foods, and acidic foods. Drinking more than 1 cup of fluid at a time. Skipping meals. The feeling that you may vomit can be worse when your stomach is empty. But if you cannot tolerate food, don't force it. Try sucking on ice chips or other frozen liquids and make up for missed meals later. Do not lie down for at least 2 hours after eating. Stay away from things that may make you vomit, such as: Food smells. Smoke. Strong smells. Loud noises. Warm, humid areas. Fast movements. General instructions Take your medicines only as told. Continue to take your prenatal vitamins as told. If you're having trouble taking them, talk with your provider about other choices. Talk with your provider about taking vitamin B6. Brush your teeth or use a mouth rinse after meals. Keep all follow-up visits. Your provider will check on your health and symptoms, as well as your baby's health. Contact a health care provider if: You have pain in your belly. You have a very bad headache. You can't see properly. You feel weak or dizzy. You can't eat or drink without throwing up, especially if this goes on for a full day. You throw up or feel like you may throw up, and the symptoms do not go away. You're losing weight. Get help right away if: There's blood in your vomit. You are very weak or faint. You have a fever and your symptoms suddenly get worse. You feel like your heart is racing or skipping a beat (palpitations). These symptoms may be an emergency. Get help right away. If you can't reach your  provider, go to an urgent care or emergency room, or call 911. Do not wait to see if the symptoms will go away. Do not drive yourself to the hospital. This information is not intended to replace advice given to you by your health care provider. Make sure you discuss any questions you have with your health care provider. Document Revised: 10/20/2022 Document Reviewed: 10/20/2022 Elsevier Patient Education  2024  [employer] Common Medications Safe in Pregnancy  Acne:      Constipation:  Benzoyl Peroxide     Colace  Clindamycin       Dulcolax Suppository  Topica Erythromycin     Fibercon  Salicylic Acid      Metamucil         Miralax  AVOID:        Senakot   Accutane    Cough:  Retin-A       Cough Drops  Tetracycline      Phenergan  w/ Codeine if Rx  Minocycline      Robitussin (Plain & DM)  Antibiotics:     Crabs/Lice:  Ceclor       RID  Cephalosporins    AVOID:  E-Mycins      Kwell  Keflex   Macrobid/Macrodantin   Diarrhea:  Penicillin      Kao-Pectate  Zithromax       Imodium AD         PUSH FLUIDS AVOID:       Cipro      Fever:  Tetracycline      Tylenol  (Regular or Extra  Minocycline       Strength)  Levaquin       Extra Strength-Do not          Exceed 8 tabs/24 hrs Caffeine:        200mg /day (equiv. To 1 cup of coffee or  approx. 3 12 oz sodas)         Gas: Cold/Hayfever:       Gas-X  Benadryl       Mylicon  Claritin       Phazyme  **Claritin-D        Chlor-Trimeton    Headaches:  Dimetapp      ASA-Free Excedrin  Drixoral-Non-Drowsy     Cold Compress  Mucinex (Guaifenasin)     Tylenol  (Regular or Extra  Sudafed/Sudafed-12 Hour     Strength)  **Sudafed PE Pseudoephedrine   Tylenol  Cold & Sinus     Vicks Vapor Rub  Zyrtec  **AVOID if Problems With Blood Pressure         Heartburn: Avoid lying down for at least 1 hour after meals  Aciphex      Maalox     Rash:  Milk of Magnesia     Benadryl     Mylanta       1% Hydrocortisone Cream  Pepcid  Pepcid Complete   Sleep Aids:  Prevacid      Ambien    Prilosec       Benadryl   Rolaids       Chamomile Tea  Tums (Limit 4/day)     Unisom          Tylenol  PM         Warm milk-add vanilla or  Hemorrhoids:       Sugar for taste  Anusol/Anusol H.C.  (RX: Analapram 2.5%)  Sugar Substitutes:  Hydrocortisone OTC     Ok in moderation  Preparation H      Tucks        Vaseline  lotion applied to tissue with  wiping    Herpes:     Throat:  Acyclovir      Oragel  Famvir  Valtrex      Vaccines:         Flu Shot Leg Cramps:       *Gardasil  Benadryl       Hepatitis A         Hepatitis B Nasal Spray:       Pneumovax  Saline Nasal Spray     Polio Booster         Tetanus Nausea:       Tuberculosis test or PPD  Vitamin B6 25 mg TID   AVOID:    Dramamine      *Gardasil  Emetrol       Live Poliovirus  Ginger Root 250 mg QID    MMR (measles, mumps &  High Complex Carbs @ Bedtime    rebella)  Sea Bands-Accupressure    Varicella (Chickenpox)  Unisom  1/2 tab TID     *No known complications           If received before Pain:         Known pregnancy;   Darvocet       Resume series after  Lortab        Delivery  Percocet    Yeast:   Tramadol       Femstat  Tylenol  3      Gyne-lotrimin  Ultram        Monistat  Vicodin           MISC:         All Sunscreens           Hair Coloring/highlights          Insect Repellant's          (Including DEET)         Mystic Tans  Commonly Asked Questions During Pregnancy  Cats: A parasite can be excreted in cat feces.  To avoid exposure you need to have another person empty the little box.  If you must empty the litter box you will need to wear gloves.  Wash your hands after handling your cat.  This parasite can also be found in raw or undercooked meat so this should also be avoided.  Colds, Sore Throats, Flu: Please check your medication sheet to see what you can take for symptoms.  If your symptoms are unrelieved by these medications please call the office.  Dental Work: Most any dental work Agricultural consultant recommends is permitted.  X-rays should only be taken during the first trimester if absolutely necessary.  Your abdomen should be shielded with a lead apron during all x-rays.  Please notify your provider prior to receiving any x-rays.  Novocaine is fine; gas is not recommended.  If your dentist requires a note from us  prior to dental work please call the office  and we will provide one for you.  Exercise: Exercise is an important part of staying healthy during your pregnancy.  You may continue most exercises you were accustomed to prior to pregnancy.  Later in your pregnancy you will most likely notice you have difficulty with activities requiring balance like riding a bicycle.  It is important that you listen to your body and avoid activities that put you at a higher risk of falling.  Adequate rest and staying well hydrated are a must!  If you have questions about the safety of specific activities  ask your provider.    Exposure to Children with illness: Try to avoid obvious exposure; report any symptoms to us  when noted,  If you have chicken pos, red measles or mumps, you should be immune to these diseases.   Please do not take any vaccines while pregnant unless you have checked with your OB provider.  Fetal Movement: After 28 weeks we recommend you do kick counts twice daily.  Lie or sit down in a calm quiet environment and count your baby movements kicks.  You should feel your baby at least 10 times per hour.  If you have not felt 10 kicks within the first hour get up, walk around and have something sweet to eat or drink then repeat for an additional hour.  If count remains less than 10 per hour notify your provider.  Fumigating: Follow your pest control agent's advice as to how long to stay out of your home.  Ventilate the area well before re-entering.  Hemorrhoids:   Most over-the-counter preparations can be used during pregnancy.  Check your medication to see what is safe to use.  It is important to use a stool softener or fiber in your diet and to drink lots of liquids.  If hemorrhoids seem to be getting worse please call the office.   Hot Tubs:  Hot tubs Jacuzzis and saunas are not recommended while pregnant.  These increase your internal body temperature and should be avoided.  Intercourse:  Sexual intercourse is safe during pregnancy as long as  you are comfortable, unless otherwise advised by your provider.  Spotting may occur after intercourse; report any bright red bleeding that is heavier than spotting.  Labor:  If you know that you are in labor, please go to the hospital.  If you are unsure, please call the office and let us  help you decide what to do.  Lifting, straining, etc:  If your job requires heavy lifting or straining please check with your provider for any limitations.  Generally, you should not lift items heavier than that you can lift simply with your hands and arms (no back muscles)  Painting:  Paint fumes do not harm your pregnancy, but may make you ill and should be avoided if possible.  Latex or water based paints have less odor than oils.  Use adequate ventilation while painting.  Permanents & Hair Color:  Chemicals in hair dyes are not recommended as they cause increase hair dryness which can increase hair loss during pregnancy.   Highlighting and permanents are allowed.  Dye may be absorbed differently and permanents may not hold as well during pregnancy.  Sunbathing:  Use a sunscreen, as skin burns easily during pregnancy.  Drink plenty of fluids; avoid over heating.  Tanning Beds:  Because their possible side effects are still unknown, tanning beds are not recommended.  Ultrasound Scans:  Routine ultrasounds are performed at approximately 20 weeks.  You will be able to see your baby's general anatomy an if you would like to know the gender this can usually be determined as well.  If it is questionable when you conceived you may also receive an ultrasound early in your pregnancy for dating purposes.  Otherwise ultrasound exams are not routinely performed unless there is a medical necessity.  Although you can request a scan we ask that you pay for it when conducted because insurance does not cover  patient request scans.  Work: If your pregnancy proceeds without complications you may work until your due date,  unless your physician or employer advises otherwise.  Round Ligament Pain/Pelvic Discomfort:  Sharp, shooting pains not associated with bleeding are fairly common, usually occurring in the second trimester of pregnancy.  They tend to be worse when standing up or when you remain standing for long periods of time.  These are the result of pressure of certain pelvic ligaments called round ligaments.  Rest, Tylenol  and heat seem to be the most effective relief.  As the womb and fetus grow, they rise out of the pelvis and the discomfort improves.  Please notify the office if your pain seems different than that described.  It may represent a more serious condition.   First Trimester of Pregnancy  The first trimester of pregnancy starts on the first day of your last monthly period until the end of week 13. This is months 1 through 3 of pregnancy. A week after a sperm fertilizes an egg, the egg will implant into the wall of the uterus and begin to develop into a baby. Body changes during your first trimester Your body goes through many changes during pregnancy. The changes usually return to normal after your baby is born. Physical changes Your breasts may grow larger and may hurt. The area around your nipples may get darker. Your periods will stop. Your hair and nails may grow faster. You may pee more often. Health changes You may tire easily. Your gums may bleed and may be sensitive when you brush and floss. You may not feel hungry. You may have heartburn. You may throw up or feel like you may throw up. You may want to eat some foods, but not others. You may have headaches. You may have trouble pooping (constipation). Other changes Your emotions may change from day to day. You may have more dreams. Follow these instructions at home: Medicines Talk to your health care provider if you're taking medicines. Ask if the medicines are safe to take during pregnancy. Your provider may change the  medicines that you take. Do not take any medicines unless told to by your provider. Take a prenatal vitamin that has at least 600 micrograms (mcg) of folic acid. Do not use herbal medicines, illegal substances, or medicines that are not approved by your provider. Eating and drinking While you're pregnant your body needs extra food for your growing baby. Talk with your provider about what to eat while pregnant. Activity Most women are able to exercise during pregnancy. Exercises may need to change as your pregnancy goes on. Talk to your provider about your activities and exercise routines. Relieving pain and discomfort Wear a good, supportive bra if your breasts hurt. Rest with your legs raised if you have leg cramps or low back pain. Safety Wear your seatbelt at all times when you're in a car. Talk to your provider if someone hits you, hurts you, or yells at you. Talk with your provider if you're feeling sad or have thoughts of hurting yourself. Lifestyle Certain things can be harmful while you're pregnant. Follow these rules: Do not use hot tubs, steam rooms, or saunas. Do not douche. Do not use tampons or scented pads. Do not drink alcohol,smoke, vape, or use products with nicotine  or tobacco in them. If you need help quitting, talk with your provider. Avoid cat litter boxes and soil used by cats. These things carry germs that can cause harm to your pregnancy and your baby. General instructions Keep all follow-up visits. It helps you and your unborn baby stay as healthy as  possible. Write down your questions. Take them to your visits. Your provider will: Talk with you about your overall health. Give you advice or refer you to specialists who can help with different needs, including: Prenatal education classes. Mental health and counseling. Foods and healthy eating. Ask for help if you need help with food. Call your dentist and ask to be seen. Brush your teeth with a soft toothbrush.  Floss gently. Where to find more information American Pregnancy Association: americanpregnancy.org [employer]: acog.org Office on [employer] Health: [employer] Contact a health care provider if: You feel dizzy, faint, or have a fever. You vomit or have watery poop (diarrhea) for 2 days or more. You have abnormal discharge or bleeding from your vagina. You have pain when you pee or your pee smells bad. You have cramps, pain, or pressure in your belly area. Get help right away if: You have trouble breathing or chest pain. You have any kind of injury, such as from a fall or a car crash. These symptoms may be an emergency. Get help right away. Call 911. Do not wait to see if the symptoms will go away. Do not drive yourself to the hospital. This information is not intended to replace advice given to you by your health care provider. Make sure you discuss any questions you have with your health care provider. Document Revised: 01/27/2023 Document Reviewed: 08/27/2022 Elsevier Patient Education  2024 [employer].

## 2024-02-12 ENCOUNTER — Emergency Department

## 2024-02-12 ENCOUNTER — Emergency Department
Admission: EM | Admit: 2024-02-12 | Discharge: 2024-02-12 | Disposition: A | Discharge status: Home / Self Care | Attending: Emergency Medicine | Admitting: Emergency Medicine

## 2024-02-12 ENCOUNTER — Other Ambulatory Visit: Payer: Self-pay

## 2024-02-12 DIAGNOSIS — O034 Incomplete spontaneous abortion without complication: Secondary | ICD-10-CM | POA: Diagnosis not present

## 2024-02-12 DIAGNOSIS — O209 Hemorrhage in early pregnancy, unspecified: Secondary | ICD-10-CM | POA: Diagnosis present

## 2024-02-12 LAB — CBC WITH DIFFERENTIAL/PLATELET
Abs Immature Granulocytes: 0.03 K/uL (ref 0.00–0.07)
Basophils Absolute: 0 K/uL (ref 0.0–0.1)
Basophils Relative: 1 %
Eosinophils Absolute: 0.1 K/uL (ref 0.0–0.5)
Eosinophils Relative: 2 %
HCT: 41.2 % (ref 36.0–46.0)
Hemoglobin: 14 g/dL (ref 12.0–15.0)
Immature Granulocytes: 0 %
Lymphocytes Relative: 26 %
Lymphs Abs: 2 K/uL (ref 0.7–4.0)
MCH: 30 pg (ref 26.0–34.0)
MCHC: 34 g/dL (ref 30.0–36.0)
MCV: 88.2 fL (ref 80.0–100.0)
Monocytes Absolute: 0.4 K/uL (ref 0.1–1.0)
Monocytes Relative: 5 %
Neutro Abs: 5.2 K/uL (ref 1.7–7.7)
Neutrophils Relative %: 66 %
Platelets: 270 K/uL (ref 150–400)
RBC: 4.67 MIL/uL (ref 3.87–5.11)
RDW: 13.3 % (ref 11.5–15.5)
WBC: 7.7 K/uL (ref 4.0–10.5)
nRBC: 0 % (ref 0.0–0.2)

## 2024-02-12 LAB — HCG, QUANTITATIVE, PREGNANCY: hCG, Beta Chain, Quant, S: 44060 m[IU]/mL — ABNORMAL HIGH (ref <5)

## 2024-02-12 NOTE — ED Provider Notes (Signed)
 [facility] Provider Note   Event Date/Time   First MD Initiated Contact with Patient 02/12/24 1101     (approximate) History  Vaginal Bleeding  HPI Suzie Vandam is a 33 y.o. female with stated past medical history of 8-week pregnancy who presents complaining of vaginal bleeding over the last week that has gotten worse.  Patient also endorses left pelvic pain that she describes as 4/10, aching, and nonradiating.  Patient took a home pregnancy test and states that it was confirmed by OB/GYN but denies having an ultrasound. ROS: Patient currently denies any vision changes, tinnitus, difficulty speaking, facial droop, sore throat, chest pain, shortness of breath, diarrhea, dysuria, or weakness/numbness/paresthesias in any extremity   Physical Exam  Triage Vital Signs: ED Triage Vitals  Encounter Vitals Group     BP 02/12/24 1035 (!) 166/97     Girls Systolic BP Percentile --      Girls Diastolic BP Percentile --      Boys Systolic BP Percentile --      Boys Diastolic BP Percentile --      Pulse Rate 02/12/24 1035 (!) 103     Resp 02/12/24 1035 18     Temp 02/12/24 1035 97.9 F (36.6 C)     Temp src --      SpO2 02/12/24 1035 97 %     Weight 02/12/24 1036 293 lb (132.9 kg)     Height 02/12/24 1036 5' 4 (1.626 m)     Head Circumference --      Peak Flow --      Pain Score 02/12/24 1036 3     Pain Loc --      Pain Education --      Exclude from Growth Chart --    Most recent vital signs: Vitals:   02/12/24 1035  BP: (!) 166/97  Pulse: (!) 103  Resp: 18  Temp: 97.9 F (36.6 C)  SpO2: 97%   General: Awake, oriented x4. CV:  Good peripheral perfusion. Resp:  Normal effort. Abd:  No distention. Other:  Middle-aged morbidly obese Caucasian female resting comfortably in no acute distress ED Results / Procedures / Treatments  Labs (all labs ordered are listed, but only abnormal results are displayed) Labs Reviewed  HCG, QUANTITATIVE,  PREGNANCY - Abnormal; Notable for the following components:      Result Value   hCG, Beta Chain, Quant, S 44,060 (*)    All other components within normal limits  CBC WITH DIFFERENTIAL/PLATELET  URINALYSIS, ROUTINE W REFLEX MICROSCOPIC   RADIOLOGY ED MD interpretation: For trimester OB ultrasound shows single intrauterine gestational sac and embryo with no cardiac activity and gestational age by ultrasound of 6 weeks and 2 days which is not concordant with gestational age by laceration.  Of 8 weeks and 1 day X-ray of the left shoulder did not show any evidence of acute abnormalities - All radiology independently interpreted and agree with radiology assessment Official radiology report(s): US  OB LESS THAN 14 WEEKS WITH OB TRANSVAGINAL Result Date: 02/12/2024 CLINICAL DATA:  [id number] Vaginal bleeding in pregnant patient at less than [redacted] weeks gestation [id number]. Gestational age by last menstrual period of 8 weeks and 1 day. Estimated due date by last menstrual period 09/22/2024. Last menstrual period 12/17/2023 EXAM: OBSTETRIC <14 WK US  AND TRANSVAGINAL OB US  TECHNIQUE: Both transabdominal and transvaginal ultrasound examinations were performed for complete evaluation of the gestation as well as the maternal uterus, adnexal regions, and pelvic cul-de-sac. Transvaginal technique  was performed to assess early pregnancy. COMPARISON:  None Available. FINDINGS: Intrauterine gestational sac: Single Yolk sac:  Not Visualized. Embryo:  Visualized. Cardiac Activity: Not Visualized. CRL:  5 mm   6 w   2 d                  US  EDC: 10/05/2024 Subchorionic hemorrhage:  None visualized. Maternal uterus/adnexae: Bilateral ovaries not definitely identified. No adnexal mass visualized. Other: No free fluid. IMPRESSION: 1. Single intrauterine gestational sac and embryo with no cardiac activity. Gestational age by ultrasound of 6 weeks and 2 days which is non-concordant with gestational age by last menstrual period of 8 weeks  and 1 day. Recommend trending of beta HCG and repeat Ob ultrasound in 7-10 days to evaluate for viability. 2. Bilateral ovaries not definitely identified. Electronically Signed   By: Morgane  Naveau M.D.   On: 02/12/2024 12:44   DG Shoulder Left Result Date: 02/12/2024 CLINICAL DATA:  pain EXAM: LEFT SHOULDER - 2+ VIEW COMPARISON:  None Available. FINDINGS: No acute fracture or dislocation. Joint spaces and alignment are maintained. No area of erosion or osseous destruction. No unexpected radiopaque foreign body. Soft tissues are unremarkable. IMPRESSION: No acute fracture or dislocation. Electronically Signed   By: Corean Salter M.D.   On: 02/12/2024 11:25   PROCEDURES: Critical Care performed: No Procedures MEDICATIONS ORDERED IN ED: Medications - No data to display IMPRESSION / MDM / ASSESSMENT AND PLAN / ED COURSE  I reviewed the triage vital signs and the nursing notes.                             The patient is on the cardiac monitor to evaluate for evidence of arrhythmia and/or significant heart rate changes. Patient's presentation is most consistent with acute presentation with potential threat to life or bodily function. Patient a 33 year old female with the above-stated past medical history presents complaining of worsening vaginal bleeding. DDx: Implantation bleeding, inevitable abortion, incomplete abortion, left humerus fracture Plan: CBC, UA, OB ultrasound, x-ray of the left shoulder  Reviewed results with patient including negative x-ray of the left shoulder however likely inevitable abortion as seen and embryo without any cardiac activity on ultrasound.  Patient provided emotional support, all questions answered, and instructions given to follow-up with OB/GYN as well as strict return precautions given.  Patient agrees with plan  Dispo: Discharge home with OB/GYN follow-up   FINAL CLINICAL IMPRESSION(S) / ED DIAGNOSES   Final diagnoses:  Incomplete inevitable abortion    Rx / DC Orders   ED Discharge Orders     None      Note:  This document was prepared using Dragon voice recognition software and may include unintentional dictation errors.   Tiant Peixoto K, MD 02/12/24 [phone]

## 2024-02-12 NOTE — Discharge Instructions (Addendum)
 Follow-up with your OB/GYN within the next week for recheck of your hCG (pregnancy hormone level)

## 2024-02-12 NOTE — ED Triage Notes (Signed)
 Pt comes with [redacted] weeks pregnant. Pt states bleeding that started week ago. Pt states it has now picked up. Pt states now pink and brown. Pt states she is also having left shoulder pain for two weeks.  Pt states lower left pelvic pain.

## 2024-02-13 ENCOUNTER — Other Ambulatory Visit: Payer: Self-pay

## 2024-02-13 DIAGNOSIS — O200 Threatened abortion: Secondary | ICD-10-CM

## 2024-02-13 NOTE — Progress Notes (Signed)
 Beta orders placed per  02/12/24 ED U/S: IMPRESSION: 1. Single intrauterine gestational sac and embryo with no cardiac activity. Gestational age by ultrasound of 6 weeks and 2 days which is non-concordant with gestational age by last menstrual period of 8 weeks and 1 day. Recommend trending of beta HCG and repeat Ob ultrasound in 7-10 days to evaluate for viability. 2. Bilateral ovaries not definitely identified.

## 2024-02-14 ENCOUNTER — Other Ambulatory Visit

## 2024-02-14 DIAGNOSIS — O200 Threatened abortion: Secondary | ICD-10-CM

## 2024-02-15 LAB — BETA HCG QUANT (REF LAB): hCG Quant: 31802 m[IU]/mL

## 2024-02-17 ENCOUNTER — Encounter: Payer: Self-pay | Admitting: Certified Nurse Midwife

## 2024-02-17 ENCOUNTER — Ambulatory Visit (INDEPENDENT_AMBULATORY_CARE_PROVIDER_SITE_OTHER): Admitting: Certified Nurse Midwife

## 2024-02-17 VITALS — BP 118/80 | HR 81 | Ht 64.0 in | Wt 294.3 lb

## 2024-02-17 DIAGNOSIS — O039 Complete or unspecified spontaneous abortion without complication: Secondary | ICD-10-CM

## 2024-02-17 MED ORDER — IBUPROFEN 800 MG PO TABS: 800.0000 mg | ORAL_TABLET | Freq: Three times a day (TID) | ORAL | 0 refills | Status: AC | PRN

## 2024-02-17 MED ORDER — ONDANSETRON 4 MG PO TBDP
4.0000 mg | ORAL_TABLET | Freq: Three times a day (TID) | ORAL | 1 refills | Status: AC | PRN
Start: 2024-02-17 — End: ?

## 2024-02-17 MED ORDER — MISOPROSTOL 200 MCG PO TABS
800.0000 ug | ORAL_TABLET | Freq: Two times a day (BID) | ORAL | 0 refills | Status: DC
Start: 2024-02-17 — End: 2024-03-29

## 2024-02-17 NOTE — Progress Notes (Signed)
 Pcp, No   Chief Complaint  Patient presents with   Miscarriage    HPI:      Sara Boyd is a 33 y.o. [phone] whose LMP was Patient's last menstrual period was 12/17/2023 (exact date)., presents today for follow up for miscarriage. She presented at the ED on 02/12/2024 with vaginal bleeding over the last week that has gotten worse. Ultrasound was done and it showed single intrauterine gestational sac and embryo with no cardiac activity and gestational age by ultrasound of 6 weeks and 2 days which is not concordant with gestational age by LMP. Her Hcg has fallen from 44K to 31K and her bleeding remains light without clots or passing tissue. She is being followed by her PCP & cardiology for hx of DM-on [facility] prior to pregnancy and palpitations.  This was an unplanned but desired pregnancy. She as a history of incomplete AB that required repeat D&C & hospital admission for infection.    Patient Active Problem List   Diagnosis Date Noted   Supervision of high risk pregnancy, antepartum 02/06/2024   Type 2 diabetes mellitus affecting pregnancy in first trimester, antepartum    Episode of moderate major depression (HCC) 05/09/2018   Anxiety and depression 05/04/2018   Panic disorder 05/04/2018   Obsessive-compulsive disorder 05/04/2018   Chronic UTI 08/08/2017   Gastroesophageal reflux disease 08/08/2017   Obesity, unspecified 08/08/2017   Palpitations 08/08/2017   Vitamin D  deficiency 08/07/2017    Past Surgical History:  Procedure Laterality Date   BLADDER SURGERY     DILATION AND CURETTAGE OF UTERUS     elective AB   DILATION AND EVACUATION N/A 10/29/2016   Procedure: DILATATION AND EVACUATION;  Surgeon: Arloa Lamar SQUIBB, MD;  Location: [facility] ORS;  Service: Gynecology;  Laterality: N/A;   HERNIA REPAIR     2011   TONSILLECTOMY     1998    Family History  Problem Relation Age of Onset   Cancer Mother        NHL   Hyperlipidemia Mother    Hypertension  Mother    COPD Father    Heart disease Father    Hypertension Father    Hyperlipidemia Father    Diabetes Maternal Grandmother    Heart disease Maternal Grandfather    Cancer Paternal Grandmother        breast   Early death Paternal Grandfather    Heart disease Paternal Grandfather     Social History   Socioeconomic History   Marital status: Married    Spouse name: Evalene   Number of children: 3   Years of education: 12   Highest education level: GED or equivalent  Occupational History   Occupation: Chief Executive Officer with [employer]  Tobacco Use   Smoking status: Former    Types: Cigarettes    Start date: 2013    Quit date: 2005    Years since quitting: 20.7   Smokeless tobacco: Never  Vaping Use   Vaping status: Never Used  Substance and Sexual Activity   Alcohol use: No   Drug use: No   Sexual activity: Yes  Other Topics Concern   Not on file  Social History Narrative   HS ed    Married    2 kids    Former smoker    Wears seat belt    Safe in relationship   Social Drivers of Health   Financial Resource Strain: Low Risk  (02/02/2024)   Overall  Financial Resource Strain (CARDIA)    Difficulty of Paying Living Expenses: Not very hard  Food Insecurity: No Food Insecurity (02/02/2024)   Hunger Vital Sign    Worried About Running Out of Food in the Last Year: Never true    Ran Out of Food in the Last Year: Never true  Transportation Needs: No Transportation Needs (02/02/2024)   PRAPARE - Administrator, Civil Service (Medical): No    Lack of Transportation (Non-Medical): No  Physical Activity: Insufficiently Active (02/02/2024)   Exercise Vital Sign    Days of Exercise per Week: 3 days    Minutes of Exercise per Session: 20 min  Stress: No Stress Concern Present (02/02/2024)   [employer] of Occupational Health - Occupational Stress Questionnaire    Feeling of Stress: Only a little  Social Connections: Moderately Integrated (02/02/2024)    Social Connection and Isolation Panel    Frequency of Communication with Friends and Family: More than three times a week    Frequency of Social Gatherings with Friends and Family: Once a week    Attends Religious Services: 1 to 4 times per year    Active Member of [employer] or Organizations: No    Attends Engineer, structural: Not on file    Marital Status: Married  Catering manager Violence: Not At Risk (02/06/2024)   Humiliation, Afraid, Rape, and Kick questionnaire    Fear of Current or Ex-Partner: No    Emotionally Abused: No    Physically Abused: No    Sexually Abused: No    Outpatient Medications Prior to Visit  Medication Sig Dispense Refill   prenatal vitamin w/FE, FA (NATACHEW) 29-1 MG CHEW chewable tablet Chew 1 tablet by mouth daily at 12 noon.     venlafaxine  XR (EFFEXOR -XR) 75 MG 24 hr capsule Take 1 capsule (75 mg total) by mouth daily. 90 capsule 0   OZEMPIC, 1 MG/DOSE, 4 MG/3ML SOPN Inject 1 mg into the skin once a week.     No facility-administered medications prior to visit.      ROS:  Review of Systems  Constitutional: Negative.   Respiratory: Negative.    Cardiovascular:  Positive for palpitations.  Genitourinary:  Positive for pelvic pain and vaginal bleeding.     OBJECTIVE:   Vitals:  BP 118/80   Pulse 81   Ht 5' 4 (1.626 m)   Wt 294 lb 4.8 oz (133.5 kg)   LMP 12/17/2023 (Exact Date)   BMI 50.52 kg/m   Physical Exam Vitals reviewed.  Constitutional:      General: She is not in acute distress.    Appearance: Normal appearance.  Cardiovascular:     Rate and Rhythm: Normal rate.  Pulmonary:     Effort: Pulmonary effort is normal.  Neurological:     General: No focal deficit present.     Mental Status: She is alert and oriented to person, place, and time.  Psychiatric:        Mood and Affect: Mood normal.        Behavior: Behavior normal.     Results: No results found for this or any previous visit (from the past 24  hours).   Assessment/Plan: 1. Miscarriage (Primary)  Incomplete Miscarriage  Based on the results of ultrasound and falling Hcg, it is my medical judgment, to a high degree of medical certainty that the patient has incomplete miscarriage in progress and that proceeding with pharmacologic management with misoprostol  is indicated.  We discussed  the usual etiologies of a miscarriage/missed abortion as well as management options to include medication, surgical, or expectant management. We discussed the pros and cons of each approach including the usual surgical risks of D&C, and the possibility of an incomplete abortion with expectant management or medication management. We touched on future family planning efforts post-miscarriage and management options when she conceives again in the future including having an early U/S for viability if she chooses.  She is Rh negative, however given bleeding began >72h ago & she is <12w EGA rhogam was deferred today.  After this discussion, patient opts for: medication management with misoprostol . She plans to take the misoprostol  on Monday if her body has not completed the miscarriage prior. She will return for an Hcg 3d after first dose misoprostol  and again at 7d after. She will take zofran  & ibuprofen  30-56m prior to misoprostol  dose. Signs of heavy bleeding, infection reviewed & when to seek emergent care discussed. Likely desires Nexplanon  for contraception once miscarriage is complete.  Meds ordered this encounter  Medications   ibuprofen  (ADVIL ) 800 MG tablet    Sig: Take 1 tablet (800 mg total) by mouth every 8 (eight) hours as needed for moderate pain (pain score 4-6).    Dispense:  30 tablet    Refill:  0   ondansetron  (ZOFRAN -ODT) 4 MG disintegrating tablet    Sig: Take 1 tablet (4 mg total) by mouth every 8 (eight) hours as needed.    Dispense:  30 tablet    Refill:  1   misoprostol  (CYTOTEC ) 200 MCG tablet    Sig: Take 4 tablets (800 mcg  total) by mouth every 12 (twelve) hours for 2 doses.    Dispense:  8 tablet    Refill:  0     Harlene Cisco, CNM [facility] 02/17/2024 4:51 PM

## 2024-02-21 ENCOUNTER — Ambulatory Visit: Admitting: Obstetrics

## 2024-02-21 ENCOUNTER — Encounter: Payer: Self-pay | Admitting: Certified Nurse Midwife

## 2024-02-23 ENCOUNTER — Other Ambulatory Visit

## 2024-02-24 ENCOUNTER — Other Ambulatory Visit

## 2024-02-24 ENCOUNTER — Telehealth: Payer: Self-pay | Admitting: Certified Nurse Midwife

## 2024-02-24 NOTE — Telephone Encounter (Signed)
 Reached out to pt to reschedule lab appt (HCG) that was scheduled on 02/24/2024 at 10:00 per J.A.  Left message for pt to call back to schedule.

## 2024-02-27 ENCOUNTER — Other Ambulatory Visit

## 2024-02-27 DIAGNOSIS — O039 Complete or unspecified spontaneous abortion without complication: Secondary | ICD-10-CM

## 2024-02-27 NOTE — Telephone Encounter (Signed)
 Pt has been reschedule to 02/27/2024 for lab appt.

## 2024-02-28 LAB — CBC
Hematocrit: 41.6 % (ref 34.0–46.6)
Hemoglobin: 13.4 g/dL (ref 11.1–15.9)
MCH: 30 pg (ref 26.6–33.0)
MCHC: 32.2 g/dL (ref 31.5–35.7)
MCV: 93 fL (ref 79–97)
Platelets: 258 x10E3/uL (ref 150–450)
RBC: 4.47 x10E6/uL (ref 3.77–5.28)
RDW: 12.9 % (ref 11.7–15.4)
WBC: 5.7 x10E3/uL (ref 3.4–10.8)

## 2024-02-28 LAB — HUMAN CHORIONIC GONADOTROPIN(HCG),B-SUBUNIT,QUANTITATIVE): HCG, Beta Chain, Quant, S: 485 m[IU]/mL

## 2024-02-29 ENCOUNTER — Ambulatory Visit: Payer: Self-pay | Admitting: Certified Nurse Midwife

## 2024-03-02 ENCOUNTER — Other Ambulatory Visit

## 2024-03-05 ENCOUNTER — Other Ambulatory Visit: Payer: Self-pay | Admitting: Certified Nurse Midwife

## 2024-03-05 DIAGNOSIS — O021 Missed abortion: Secondary | ICD-10-CM

## 2024-03-07 ENCOUNTER — Other Ambulatory Visit

## 2024-03-07 ENCOUNTER — Telehealth: Payer: Self-pay | Admitting: Certified Nurse Midwife

## 2024-03-07 NOTE — Telephone Encounter (Signed)
 Reached out to pt to reschedule beta labs that were scheduled on 03/07/2024 at 8:40.  Was able to reschedule appt for 10/30/025 at 9:00.

## 2024-03-08 ENCOUNTER — Other Ambulatory Visit

## 2024-03-08 DIAGNOSIS — O039 Complete or unspecified spontaneous abortion without complication: Secondary | ICD-10-CM

## 2024-03-09 LAB — HUMAN CHORIONIC GONADOTROPIN(HCG),B-SUBUNIT,QUANTITATIVE): HCG, Beta Chain, Quant, S: 105 m[IU]/mL

## 2024-03-11 ENCOUNTER — Other Ambulatory Visit: Payer: Self-pay | Admitting: Obstetrics

## 2024-03-11 DIAGNOSIS — F419 Anxiety disorder, unspecified: Secondary | ICD-10-CM

## 2024-03-11 DIAGNOSIS — F32A Depression, unspecified: Secondary | ICD-10-CM

## 2024-03-12 ENCOUNTER — Encounter: Admitting: Certified Nurse Midwife

## 2024-03-12 ENCOUNTER — Other Ambulatory Visit: Payer: Self-pay

## 2024-03-12 DIAGNOSIS — F32A Depression, unspecified: Secondary | ICD-10-CM

## 2024-03-12 DIAGNOSIS — F419 Anxiety disorder, unspecified: Secondary | ICD-10-CM

## 2024-03-12 MED ORDER — VENLAFAXINE HCL ER 75 MG PO CP24: 75.0000 mg | ORAL_CAPSULE | Freq: Every day | ORAL | 0 refills | Status: AC

## 2024-03-12 NOTE — Progress Notes (Signed)
 Appt for annual exam with Harlene Cisco CNM 03/29/24

## 2024-03-14 ENCOUNTER — Other Ambulatory Visit

## 2024-03-14 ENCOUNTER — Encounter: Payer: Self-pay | Admitting: Certified Nurse Midwife

## 2024-03-15 ENCOUNTER — Other Ambulatory Visit: Payer: Self-pay

## 2024-03-15 ENCOUNTER — Other Ambulatory Visit

## 2024-03-15 DIAGNOSIS — O039 Complete or unspecified spontaneous abortion without complication: Secondary | ICD-10-CM

## 2024-03-16 LAB — BETA HCG QUANT (REF LAB): hCG Quant: 39 m[IU]/mL

## 2024-03-19 ENCOUNTER — Encounter: Payer: Self-pay | Admitting: Certified Nurse Midwife

## 2024-03-20 ENCOUNTER — Other Ambulatory Visit

## 2024-03-20 ENCOUNTER — Other Ambulatory Visit: Payer: Self-pay

## 2024-03-20 ENCOUNTER — Ambulatory Visit: Admission: RE | Admit: 2024-03-20 | Source: Ambulatory Visit

## 2024-03-20 DIAGNOSIS — O039 Complete or unspecified spontaneous abortion without complication: Secondary | ICD-10-CM

## 2024-03-21 ENCOUNTER — Other Ambulatory Visit

## 2024-03-22 LAB — BETA HCG QUANT (REF LAB): hCG Quant: 18 m[IU]/mL

## 2024-03-23 ENCOUNTER — Other Ambulatory Visit: Payer: Self-pay | Admitting: Certified Nurse Midwife

## 2024-03-23 ENCOUNTER — Ambulatory Visit
Admission: RE | Admit: 2024-03-23 | Discharge: 2024-03-23 | Disposition: A | Discharge status: Home / Self Care | Source: Ambulatory Visit | Attending: Certified Nurse Midwife | Admitting: Certified Nurse Midwife

## 2024-03-23 DIAGNOSIS — O021 Missed abortion: Secondary | ICD-10-CM | POA: Insufficient documentation

## 2024-03-27 ENCOUNTER — Other Ambulatory Visit: Payer: Self-pay

## 2024-03-27 ENCOUNTER — Other Ambulatory Visit

## 2024-03-27 DIAGNOSIS — O039 Complete or unspecified spontaneous abortion without complication: Secondary | ICD-10-CM

## 2024-03-28 LAB — BETA HCG QUANT (REF LAB): hCG Quant: 10 m[IU]/mL

## 2024-03-29 ENCOUNTER — Encounter: Payer: Self-pay | Admitting: Certified Nurse Midwife

## 2024-03-29 ENCOUNTER — Ambulatory Visit: Payer: Self-pay | Admitting: Certified Nurse Midwife

## 2024-03-29 ENCOUNTER — Ambulatory Visit: Admitting: Certified Nurse Midwife

## 2024-03-29 VITALS — BP 105/71 | HR 87 | Ht 64.0 in | Wt 283.2 lb

## 2024-03-29 DIAGNOSIS — Z30017 Encounter for initial prescription of implantable subdermal contraceptive: Secondary | ICD-10-CM

## 2024-03-29 MED ORDER — ETONOGESTREL 68 MG ~~LOC~~ IMPL: 68.0000 mg | DRUG_IMPLANT | Freq: Once | SUBCUTANEOUS | Status: AC

## 2024-03-29 NOTE — Patient Instructions (Signed)
NEXPLANON PLACEMENT POST-PROCEDURE INSTRUCTIONS  You may take Ibuprofen, Aleve or Tylenol for pain if needed.  Pain should resolve within in 24 hours.  You may have intercourse after 24 hours.  If you using this for birth control, it is effective immediately.  You need to call if you have any fever, heavy bleeding, or redness at insertion site. Irregular bleeding is common the first several months after having a Nexplanonplaced. You do not need to call for this reason unless you are concerned.  Shower or bathe as normal.  You can remove the bandage after 24 hours.  

## 2024-03-29 NOTE — Progress Notes (Signed)
   Sara Boyd is a 33 y.o. year old Caucasian female here for Nexplanon  insertion.  Patient's last menstrual period was 12/17/2023 (exact date)., last sexual intercourse was prior to recent miscarriage, Hcg drawn 11/18 & continues to fall appropriately, now 10.  Risks/benefits/side effects of Nexplanon  have been discussed and her questions have been answered.  Sara Boyd is aware of the common side effect of irregular bleeding, which the incidence of decreases over time.  BP 105/71   Pulse 87   Ht 5' 4 (1.626 m)   Wt 283 lb 3.2 oz (128.5 kg)   LMP 12/17/2023 (Exact Date)   Breastfeeding No   BMI 48.61 kg/m      She is right-handed, so her left arm, approximately 4 inches proximal from the elbow, was cleansed with alcohol and anesthetized with 2cc of 2% Lidocaine .  The area was cleansed again with chloraprep and the Nexplanon  was inserted per manufacturer's recommendations without difficulty.  A steri-strip and pressure bandage were applied.  Pt was instructed to keep the area clean and dry, remove pressure bandage in 24 hours, and keep insertion site covered with the steri-strip for 3-5 days.  Back up contraception was recommended for 2 weeks.  She was given a card indicating date Nexplanon  was inserted and date it needs to be removed. Follow-up PRN problems.  Sara Boyd,CNM

## 2024-04-02 ENCOUNTER — Ambulatory Visit: Admission: EM | Admit: 2024-04-02 | Discharge: 2024-04-02 | Disposition: A | Discharge status: Home / Self Care

## 2024-04-02 DIAGNOSIS — L03032 Cellulitis of left toe: Secondary | ICD-10-CM

## 2024-04-02 MED ORDER — DOXYCYCLINE HYCLATE 100 MG PO CAPS: 100.0000 mg | ORAL_CAPSULE | Freq: Two times a day (BID) | ORAL | 0 refills | Status: AC

## 2024-04-02 NOTE — ED Provider Notes (Signed)
 Sara Boyd    CSN: [id number] Arrival date & time: 04/02/24  1728      History   Chief Complaint Chief Complaint  Patient presents with   Wound Check    HPI Sara Boyd is a 33 y.o. female.  Patient presents with 4-day history of redness and swelling beside her left great toe nail which started after she pulled a hangnail.  No open wound or drainage.  No fever, chills, numbness, weakness.  She has been treating the area with rubbing alcohol and Epsom salt soaks.  She noticed redness with red streak on her foot today.  Patient is diabetic.  The history is provided by the patient and medical records.    Past Medical History:  Diagnosis Date   Anxiety    Chicken pox    Depression    Endometritis    GERD (gastroesophageal reflux disease)    Headache    Migraine    OCD (obsessive compulsive disorder)    OCD (obsessive compulsive disorder)    Retained products of conception following abortion    Type 2 diabetes mellitus affecting pregnancy in first trimester, antepartum    UTI (urinary tract infection)     Patient Active Problem List   Diagnosis Date Noted   Miscarriage 02/17/2024   Type 2 diabetes mellitus affecting pregnancy in first trimester, antepartum    Episode of moderate major depression (HCC) 05/09/2018   Anxiety and depression 05/04/2018   Panic disorder 05/04/2018   Obsessive-compulsive disorder 05/04/2018   Chronic UTI 08/08/2017   Gastroesophageal reflux disease 08/08/2017   Obesity, unspecified 08/08/2017   Palpitations 08/08/2017   Vitamin D  deficiency 08/07/2017    Past Surgical History:  Procedure Laterality Date   BLADDER SURGERY     DILATION AND CURETTAGE OF UTERUS     elective AB   DILATION AND EVACUATION N/A 10/29/2016   Procedure: DILATATION AND EVACUATION;  Surgeon: Arloa Lamar SQUIBB, MD;  Location: [facility] ORS;  Service: Gynecology;  Laterality: N/A;   HERNIA REPAIR     2011   TONSILLECTOMY     1998    OB  History     Gravida  5   Para  3   Term  3   Preterm  0   AB  2   Living  3      SAB  1   IAB  1   Ectopic  0   Multiple      Live Births  3        Obstetric Comments  2014-Pt water ruptured @ 19 weeks, bed rest, baby with CP          Home Medications    Prior to Admission medications   Medication Sig Start Date End Date Taking? Authorizing Provider  doxycycline  (VIBRAMYCIN ) 100 MG capsule Take 1 capsule (100 mg total) by mouth 2 (two) times daily for 7 days. 04/02/24 04/09/24 Yes Corlis Burnard DEL, NP  ibuprofen  (ADVIL ) 800 MG tablet Take 1 tablet (800 mg total) by mouth every 8 (eight) hours as needed for moderate pain (pain score 4-6). 02/17/24   Jayne Harlene CROME, CNM  ondansetron  (ZOFRAN -ODT) 4 MG disintegrating tablet Take 1 tablet (4 mg total) by mouth every 8 (eight) hours as needed. 02/17/24   Jayne Harlene CROME, CNM  OZEMPIC, 0.25 OR 0.5 MG/DOSE, 2 MG/3ML SOPN Inject 0.5 mg into the skin once a week.    [provider]  OZEMPIC, 1 MG/DOSE, 4 MG/3ML SOPN Inject  1 mg into the skin once a week.    [provider]  prenatal vitamin w/FE, FA (NATACHEW) 29-1 MG CHEW chewable tablet Chew 1 tablet by mouth daily at 12 noon. Patient not taking: Reported on 04/02/2024    [provider]  venlafaxine  XR (EFFEXOR -XR) 75 MG 24 hr capsule Take 1 capsule (75 mg total) by mouth daily. 03/12/24   Jayne Harlene CROME, CNM    Family History Family History  Problem Relation Age of Onset   Cancer Mother        NHL   Hyperlipidemia Mother    Hypertension Mother    COPD Father    Heart disease Father    Hypertension Father    Hyperlipidemia Father    Diabetes Maternal Grandmother    Heart disease Maternal Grandfather    Cancer Paternal Grandmother        breast   Early death Paternal Grandfather    Heart disease Paternal Grandfather     Social History Social History   Tobacco Use   Smoking status: Former    Types: Cigarettes     Start date: 2013    Quit date: 2005    Years since quitting: 20.9   Smokeless tobacco: Never  Vaping Use   Vaping status: Never Used  Substance Use Topics   Alcohol use: No   Drug use: No     Allergies   Pineapple, Sulfa antibiotics, Latex, Vicodin [hydrocodone-acetaminophen ], Codeine, and Penicillins   Review of Systems Review of Systems  Constitutional:  Negative for chills and fever.  Musculoskeletal:  Negative for gait problem and joint swelling.  Skin:  Positive for color change and wound.  Neurological:  Negative for weakness and numbness.     Physical Exam Triage Vital Signs ED Triage Vitals  Encounter Vitals Group     BP 04/02/24 1747 139/87     Girls Systolic BP Percentile --      Girls Diastolic BP Percentile --      Boys Systolic BP Percentile --      Boys Diastolic BP Percentile --      Pulse Rate 04/02/24 1747 90     Resp 04/02/24 1747 18     Temp 04/02/24 1747 98.5 F (36.9 C)     Temp src --      SpO2 04/02/24 1747 98 %     Weight --      Height --      Head Circumference --      Peak Flow --      Pain Score 04/02/24 1757 8     Pain Loc --      Pain Education --      Exclude from Growth Chart --    No data found.  Updated Vital Signs BP 139/87   Pulse 90   Temp 98.5 F (36.9 C)   Resp 18   LMP 12/17/2023 (Exact Date)   SpO2 98%   Visual Acuity Right Eye Distance:   Left Eye Distance:   Bilateral Distance:    Right Eye Near:   Left Eye Near:    Bilateral Near:     Physical Exam Constitutional:      General: She is not in acute distress. HENT:     Mouth/Throat:     Mouth: Mucous membranes are moist.  Cardiovascular:     Rate and Rhythm: Normal rate.  Pulmonary:     Effort: Pulmonary effort is normal. No respiratory distress.  Musculoskeletal:  General: No deformity. Normal range of motion.  Skin:    General: Skin is warm and dry.     Capillary Refill: Capillary refill takes less than 2 seconds.     Findings:  Erythema and lesion present.     Comments: Left great toe has tender area of erythema and mild edema on medial side of toenail with localized erythema.  No wounds or drainage.  Patient has a red streak on the top of her foot.  2+ pedal pulse, sensation intact, strength 5/5.  Neurological:     General: No focal deficit present.     Mental Status: She is alert.     Sensory: No sensory deficit.     Motor: No weakness.     Gait: Gait normal.      UC Treatments / Results  Labs (all labs ordered are listed, but only abnormal results are displayed) Labs Reviewed - No data to display  EKG   Radiology No results found.  Procedures Procedures (including critical care time)  Medications Ordered in UC Medications - No data to display  Initial Impression / Assessment and Plan / UC Course  I have reviewed the triage vital signs and the nursing notes.  Pertinent labs & imaging results that were available during my care of the patient were reviewed by me and considered in my medical decision making (see chart for details).    Paronychia of left great toe, cellulitis.  Afebrile and vital signs are stable.  Discussed limitations of evaluation and treatment of her symptoms in an urgent care setting.  She declines transfer to the ED at this time and would like to attempt treatment at home with oral antibiotic.  Treating today with doxycycline .  Instructed her to follow-up with a podiatrist.  Contact information for on-call podiatrist as well as [facility] provided.  Strict ED precautions discussed at length.  Education provided on paronychia.  Patient agrees to plan of care.  Final Clinical Impressions(s) / UC Diagnoses   Final diagnoses:  Paronychia of great toe of left foot  Cellulitis of toe of left foot     Discharge Instructions      Take the doxycycline  as directed.  Go to the emergency department if you have worsening symptoms.  Follow-up with a podiatrist such as the  one listed below   Or   [facility] [address] [place], [place] [address] Phone: [phone]      ED Prescriptions     Medication Sig Dispense Auth. Provider   doxycycline  (VIBRAMYCIN ) 100 MG capsule Take 1 capsule (100 mg total) by mouth 2 (two) times daily for 7 days. 14 capsule Corlis Burnard DEL, NP      PDMP not reviewed this encounter.   Corlis Burnard DEL, NP 04/02/24 [phone]

## 2024-04-02 NOTE — ED Triage Notes (Signed)
 Patient to Urgent Care with complaints of left big toe nail swelling and redness/ red streaks to top of foot. Reports she pulled a hangnail. No drainage/ no known fevers.   Symptoms x4 days.  Cleaning the area with alcohol/ epsom salt soaks.

## 2024-04-02 NOTE — Discharge Instructions (Addendum)
 Take the doxycycline  as directed.  Go to the emergency department if you have worsening symptoms.  Follow-up with a podiatrist such as the one listed below   Or   [facility] [address] [place], [place] [address] Phone: [phone]

## 2024-04-03 ENCOUNTER — Encounter: Payer: Self-pay | Admitting: Emergency Medicine

## 2024-04-03 ENCOUNTER — Other Ambulatory Visit: Payer: Self-pay

## 2024-04-03 ENCOUNTER — Emergency Department
Admission: EM | Admit: 2024-04-03 | Discharge: 2024-04-03 | Disposition: A | Discharge status: Home / Self Care | Attending: Emergency Medicine | Admitting: Emergency Medicine

## 2024-04-03 DIAGNOSIS — L03119 Cellulitis of unspecified part of limb: Secondary | ICD-10-CM

## 2024-04-03 DIAGNOSIS — L03116 Cellulitis of left lower limb: Secondary | ICD-10-CM | POA: Diagnosis present

## 2024-04-03 DIAGNOSIS — E119 Type 2 diabetes mellitus without complications: Secondary | ICD-10-CM | POA: Diagnosis not present

## 2024-04-03 LAB — CBC WITH DIFFERENTIAL/PLATELET
Abs Immature Granulocytes: 0.03 K/uL (ref 0.00–0.07)
Basophils Absolute: 0.1 K/uL (ref 0.0–0.1)
Basophils Relative: 1 %
Eosinophils Absolute: 0.1 K/uL (ref 0.0–0.5)
Eosinophils Relative: 1 %
HCT: 43.3 % (ref 36.0–46.0)
Hemoglobin: 14.6 g/dL (ref 12.0–15.0)
Immature Granulocytes: 0 %
Lymphocytes Relative: 33 %
Lymphs Abs: 3.3 K/uL (ref 0.7–4.0)
MCH: 29.6 pg (ref 26.0–34.0)
MCHC: 33.7 g/dL (ref 30.0–36.0)
MCV: 87.7 fL (ref 80.0–100.0)
Monocytes Absolute: 0.6 K/uL (ref 0.1–1.0)
Monocytes Relative: 6 %
Neutro Abs: 6.1 K/uL (ref 1.7–7.7)
Neutrophils Relative %: 59 %
Platelets: 344 K/uL (ref 150–400)
RBC: 4.94 MIL/uL (ref 3.87–5.11)
RDW: 13.1 % (ref 11.5–15.5)
WBC: 10.2 K/uL (ref 4.0–10.5)
nRBC: 0 % (ref 0.0–0.2)

## 2024-04-03 LAB — COMPREHENSIVE METABOLIC PANEL WITH GFR
ALT: 24 U/L (ref 0–44)
AST: 19 U/L (ref 15–41)
Albumin: 4.5 g/dL (ref 3.5–5.0)
Alkaline Phosphatase: 77 U/L (ref 38–126)
Anion gap: 15 (ref 5–15)
BUN: 11 mg/dL (ref 6–20)
CO2: 17 mmol/L — ABNORMAL LOW (ref 22–32)
Calcium: 9.9 mg/dL (ref 8.9–10.3)
Chloride: 105 mmol/L (ref 98–111)
Creatinine, Ser: 0.92 mg/dL (ref 0.44–1.00)
GFR, Estimated: >60 mL/min (ref >60)
Glucose, Bld: 184 mg/dL — ABNORMAL HIGH (ref 70–99)
Potassium: 3.8 mmol/L (ref 3.5–5.1)
Sodium: 137 mmol/L (ref 135–145)
Total Bilirubin: 0.5 mg/dL (ref 0.0–1.2)
Total Protein: 7.5 g/dL (ref 6.5–8.1)

## 2024-04-03 LAB — LACTIC ACID, PLASMA: Lactic Acid, Venous: 1.5 mmol/L (ref 0.5–1.9)

## 2024-04-03 MED ORDER — ACETAMINOPHEN 500 MG PO TABS
1000.0000 mg | ORAL_TABLET | Freq: Once | ORAL | Status: DC
  Filled 2024-04-03: qty 2

## 2024-04-03 NOTE — ED Triage Notes (Addendum)
 Patient ambulatory to triage with steady gait, without difficulty or distress noted; pt reports being seen at [facility] yesterday for left great toe infection from a hangnail; rx doxycycline  but recommended to be seen at ER due to concerns over possible sepsis; pt denies pain, only with wt-bearing

## 2024-04-03 NOTE — ED Provider Notes (Signed)
 [facility] Provider Note    Event Date/Time   First MD Initiated Contact with Patient 04/03/24 0518     (approximate)   History   Toe Pain   HPI  Sara Boyd is a 33 y.o. female with history of migraine, OCD, diabetes, depression who presents to the emergency department for concerns for cellulitis of the left lower extremity.  She was seen at [facility] health urgent care yesterday due to 4 days of redness, swelling and discomfort in the left dorsal foot after she pulled a hangnail.  Was started on doxycycline .  No fever or chills.  Urgent care staff was concerned about possible sepsis so sent her here to the emergency department.  She has had 1 dose of doxycycline  and took ibuprofen  prior to arrival reports redness, pain and swelling have improved.   History provided by patient, daughter.    Past Medical History:  Diagnosis Date   Anxiety    Chicken pox    Depression    Endometritis    GERD (gastroesophageal reflux disease)    Headache    Migraine    OCD (obsessive compulsive disorder)    OCD (obsessive compulsive disorder)    Retained products of conception following abortion    Type 2 diabetes mellitus affecting pregnancy in first trimester, antepartum    UTI (urinary tract infection)     Past Surgical History:  Procedure Laterality Date   BLADDER SURGERY     DILATION AND CURETTAGE OF UTERUS     elective AB   DILATION AND EVACUATION N/A 10/29/2016   Procedure: DILATATION AND EVACUATION;  Surgeon: Arloa Lamar SQUIBB, MD;  Location: [facility] ORS;  Service: Gynecology;  Laterality: N/A;   HERNIA REPAIR     2011   TONSILLECTOMY     1998    MEDICATIONS:  Prior to Admission medications   Medication Sig Start Date End Date Taking? Authorizing Provider  doxycycline  (VIBRAMYCIN ) 100 MG capsule Take 1 capsule (100 mg total) by mouth 2 (two) times daily for 7 days. 04/02/24 04/09/24  Corlis Burnard DEL, NP  ibuprofen  (ADVIL ) 800 MG tablet Take 1  tablet (800 mg total) by mouth every 8 (eight) hours as needed for moderate pain (pain score 4-6). 02/17/24   Jayne Harlene CROME, CNM  ondansetron  (ZOFRAN -ODT) 4 MG disintegrating tablet Take 1 tablet (4 mg total) by mouth every 8 (eight) hours as needed. 02/17/24   Jayne Harlene CROME, CNM  OZEMPIC, 0.25 OR 0.5 MG/DOSE, 2 MG/3ML SOPN Inject 0.5 mg into the skin once a week.    [provider]  OZEMPIC, 1 MG/DOSE, 4 MG/3ML SOPN Inject 1 mg into the skin once a week.    [provider]  prenatal vitamin w/FE, FA (NATACHEW) 29-1 MG CHEW chewable tablet Chew 1 tablet by mouth daily at 12 noon. Patient not taking: Reported on 04/02/2024    [provider]  venlafaxine  XR (EFFEXOR -XR) 75 MG 24 hr capsule Take 1 capsule (75 mg total) by mouth daily. 03/12/24   Jayne Harlene CROME, CNM    Physical Exam   Triage Vital Signs: ED Triage Vitals  Encounter Vitals Group     BP 04/03/24 0157 (!) 149/100     Girls Systolic BP Percentile --      Girls Diastolic BP Percentile --      Boys Systolic BP Percentile --      Boys Diastolic BP Percentile --      Pulse Rate 04/03/24 0157 (!) 110  Resp 04/03/24 0157 18     Temp 04/03/24 0157 99.2 F (37.3 C)     Temp Source 04/03/24 0157 Oral     SpO2 04/03/24 0157 100 %     Weight --      Height --      Head Circumference --      Peak Flow --      Pain Score 04/03/24 0154 4     Pain Loc --      Pain Education --      Exclude from Growth Chart --     Most recent vital signs: Vitals:   04/03/24 0157 04/03/24 0521  BP: (!) 149/100 119/85  Pulse: (!) 110 91  Resp: 18 18  Temp: 99.2 F (37.3 C) 98.4 F (36.9 C)  SpO2: 100% 99%    CONSTITUTIONAL: Alert, responds appropriately to questions. Well-appearing; well-nourished HEAD: Normocephalic, atraumatic EYES: Conjunctivae clear, pupils appear equal, sclera nonicteric ENT: normal nose; moist mucous membranes NECK: Supple, normal ROM CARD: RRR; S1 and S2  appreciated RESP: Normal chest excursion without splinting or tachypnea; breath sounds clear and equal bilaterally; no wheezes, no rhonchi, no rales, no hypoxia or respiratory distress, speaking full sentences ABD/GI: Non-distended; soft, non-tender, no rebound, no guarding, no peritoneal signs BACK: The back appears normal EXT: Normal ROM in all joints; no deformity noted, no edema, mild streaking of redness and warmth from the dorsal great left toe to the mid left foot without crepitus, ecchymosis, fluctuance, induration.  2+ left DP pulse.  No calf tenderness or calf swelling.  Compartments of the left leg are soft.  Normal capillary refill. SKIN: Normal color for age and race; warm; no rash on exposed skin NEURO: Moves all extremities equally, normal speech PSYCH: The patient's mood and manner are appropriate.   LEFT FOOT   Patient gave verbal permission to utilize photo for medical documentation only. The image was not stored on any personal device.  ED Results / Procedures / Treatments   LABS: (all labs ordered are listed, but only abnormal results are displayed) Labs Reviewed  COMPREHENSIVE METABOLIC PANEL WITH GFR - Abnormal; Notable for the following components:      Result Value   CO2 17 (*)    Glucose, Bld 184 (*)    All other components within normal limits  CBC WITH DIFFERENTIAL/PLATELET  LACTIC ACID, PLASMA     EKG:   RADIOLOGY: My personal review and interpretation of imaging:    I have personally reviewed all radiology reports.   No results found.   PROCEDURES:  Critical Care performed: No     Procedures    IMPRESSION / MDM / ASSESSMENT AND PLAN / ED COURSE  I reviewed the triage vital signs and the nursing notes.    Patient has very mild cellulitis of the left foot without systemic symptoms.  She states she did vomit once but this was after taking doxycycline .  She feels like the redness, swelling and warmth have already improved.  No  fever.  DIFFERENTIAL DIAGNOSIS (includes but not limited to):   Cellulitis, gout, no sign of abscess, doubt osteomyelitis, no signs of necrotizing fasciitis, doubt DVT or arterial obstruction   Patient's presentation is most consistent with acute complicated illness / injury requiring diagnostic workup.   PLAN: Labs show no leukocytosis.  Normal electrolytes, glucose.  Normal lactic.  Was tachycardic on arrival but states that she was very nervous.  Vital signs have improved.  She already thinks that the area of  redness, swelling and pain have improved with just ibuprofen  and doxycycline  x 1 at home.  No indication for IV antibiotics or to broaden her antibiotics at this time as erythema is very minimal on exam currently and no significant increase in warmth.  Patient comfortable with this plan.  She is not septic today.   MEDICATIONS GIVEN IN ED: Medications  acetaminophen  (TYLENOL ) tablet 1,000 mg (1,000 mg Oral Patient Refused/Not Given 04/03/24 0520)     ED COURSE:  At this time, I do not feel there is any life-threatening condition present. I reviewed all nursing notes, vitals, pertinent previous records.  All lab and urine results, EKGs, imaging ordered have been independently reviewed and interpreted by myself.  I reviewed all available radiology reports from any imaging ordered this visit.  Based on my assessment, I feel the patient is safe to be discharged home without further emergent workup and can continue workup as an outpatient as needed. Discussed all findings, treatment plan as well as usual and customary return precautions.  They verbalize understanding and are comfortable with this plan.  Outpatient follow-up has been provided as needed.  All questions have been answered.    CONSULTS:  none   OUTSIDE RECORDS REVIEWED: Reviewed urgent care note from today.       FINAL CLINICAL IMPRESSION(S) / ED DIAGNOSES   Final diagnoses:  Cellulitis of lower extremity,  unspecified laterality     Rx / DC Orders   ED Discharge Orders     None        Note:  This document was prepared using Dragon voice recognition software and may include unintentional dictation errors.   Orra Nolde, Josette SAILOR, DO 04/03/24 [phone]

## 2024-04-03 NOTE — Discharge Instructions (Signed)
 You may alternate over the counter Tylenol  1000 mg every 6 hours as needed for pain, fever and Ibuprofen  800 mg every 6-8 hours as needed for pain, fever.  Please take Ibuprofen  with food.  Do not take more than 4000 mg of Tylenol  (acetaminophen ) in a 24 hour period.  Continue your doxycycline  as prescribed.  You may take nausea medicine 30 minutes before taking doxycycline  to prevent nausea and vomiting with this antibiotic.

## 2024-04-19 ENCOUNTER — Encounter: Payer: Self-pay | Admitting: Certified Nurse Midwife

## 2024-04-25 ENCOUNTER — Ambulatory Visit: Admitting: Certified Nurse Midwife

## 2024-04-25 NOTE — Progress Notes (Deleted)
 ANNUAL EXAM Patient name: Sara Boyd MRN [id number]  Date of birth: 31-Dec-1990 Chief Complaint:   No chief complaint on file.  History of Present Illness:   Sara Boyd is a 33 y.o. [phone] Caucasian female being seen today for a routine annual exam.  Current complaints: ***  Patient's last menstrual period was 12/17/2023 (exact date).       The pregnancy intention screening data noted above was reviewed. Potential methods of contraception were discussed. The patient elected to proceed with No data recorded.      Component Value Date/Time   DIAGPAP  11/23/2022 9178    - Negative for intraepithelial lesion or malignancy (NILM)   DIAGPAP  10/18/2018 0000    -  NEGATIVE FOR INTRAEPITHELIAL LESIONS OR MALIGNANCY   HPVHIGH Negative 11/23/2022 0821   ADEQPAP  11/23/2022 0821    Satisfactory for evaluation; transformation zone component PRESENT.   ADEQPAP  10/18/2018 0000    Satisfactory for evaluation  endocervical/transformation zone component PRESENT. Partially obscuring inflammation is present.      Last pap 11/23/22. Results were: negative. H/O abnormal pap: no/ Last mammogram: N/A. Results were: N/A. Family h/o breast cancer: {yes***/no:23838} Last colonoscopy: N/A. Results were: N/A. Family h/o colorectal cancer: {yes***/no:23838}     11/23/2022    8:13 AM 12/11/2019    5:11 PM 11/14/2019    8:03 AM 05/04/2018    4:46 PM  Depression screen PHQ 2/9  Decreased Interest 0 1  3  Down, Depressed, Hopeless 0 2 3  3   PHQ - 2 Score 0 3 3 6   Altered sleeping  2  3  Tired, decreased energy  3  3  Change in appetite  2 1  3   Feeling bad or failure about yourself   2  3  Trouble concentrating  1 3  3   Moving slowly or fidgety/restless  0  2  Suicidal thoughts  0  0  PHQ-9 Score  13   23   Difficult doing work/chores  Somewhat difficult  Very difficult     Manually entered by patient   Data saved with a previous flowsheet row definition         12/11/2019    5:12 PM 11/14/2019    8:02 AM 06/13/2019    3:50 PM 05/02/2019    9:00 AM  GAD 7 : Generalized Anxiety Score  Nervous, Anxious, on Edge 2 3  1 3   Control/stop worrying 2 3  1 3   Worry too much - different things 2 3  1 3   Trouble relaxing 1 3  0 3  Restless 1 1  0 3  Easily annoyed or irritable 2 3  0 2  Afraid - awful might happen 2 3  1 3   Total GAD 7 Score 12 19 4 20   Anxiety Difficulty Somewhat difficult Extremely difficult  Somewhat difficult Very difficult     Manually entered by patient      Past Medical History:  Diagnosis Date   Anxiety    Chicken pox    Depression    Endometritis    GERD (gastroesophageal reflux disease)    Headache    Migraine    OCD (obsessive compulsive disorder)    OCD (obsessive compulsive disorder)    Retained products of conception following abortion    Type 2 diabetes mellitus affecting pregnancy in first trimester, antepartum    UTI (urinary tract infection)     Family History  Problem Relation Age of  Onset   Cancer Mother        NHL   Hyperlipidemia Mother    Hypertension Mother    COPD Father    Heart disease Father    Hypertension Father    Hyperlipidemia Father    Diabetes Maternal Grandmother    Heart disease Maternal Grandfather    Cancer Paternal Grandmother        breast   Early death Paternal Grandfather    Heart disease Paternal Grandfather    Review of Systems:   Pertinent items are noted in HPI Denies any headaches, blurred vision, fatigue, shortness of breath, chest pain, abdominal pain, abnormal vaginal discharge/itching/odor/irritation, problems with periods, bowel movements, urination, or intercourse unless otherwise stated above. Pertinent History Reviewed:  Reviewed past medical,surgical, social and family history.  Reviewed problem list, medications and allergies. Physical Assessment:  There were no vitals filed for this visit.There is no height or weight on file to calculate BMI.        Physical Exam   No results found for this or any previous visit (from the past 24 hours).  Assessment & Plan:  1. Encounter for well woman exam with routine gynecological exam (Primary)  2. Nexplanon  removal   Mammogram: {Mammo f/u:25212::@ 33yo}, or sooner if problems Colonoscopy: {TCS f/u:25213::@ 33yo}, or sooner if problems  No orders of the defined types were placed in this encounter.   Meds: No orders of the defined types were placed in this encounter.   Follow-up: No follow-ups on file.  Rollo [name], CMA 04/25/2024 1:30 PM

## 2024-05-11 ENCOUNTER — Encounter: Payer: Self-pay | Admitting: Certified Nurse Midwife
